# Patient Record
Sex: Female | Born: 1975 | Race: White | Hispanic: No | Marital: Married | Smoking: Former smoker
Health system: Southern US, Community
[De-identification: ages and names within clinical notes are randomized; demographics above are authoritative.]

## PROBLEM LIST (undated history)

## (undated) DIAGNOSIS — M1990 Unspecified osteoarthritis, unspecified site: Secondary | ICD-10-CM

## (undated) DIAGNOSIS — Z8669 Personal history of other diseases of the nervous system and sense organs: Secondary | ICD-10-CM

## (undated) DIAGNOSIS — Z87442 Personal history of urinary calculi: Secondary | ICD-10-CM

## (undated) DIAGNOSIS — M069 Rheumatoid arthritis, unspecified: Secondary | ICD-10-CM

## (undated) DIAGNOSIS — R002 Palpitations: Secondary | ICD-10-CM

## (undated) DIAGNOSIS — I499 Cardiac arrhythmia, unspecified: Secondary | ICD-10-CM

## (undated) DIAGNOSIS — R7303 Prediabetes: Secondary | ICD-10-CM

## (undated) DIAGNOSIS — T7840XA Allergy, unspecified, initial encounter: Secondary | ICD-10-CM

## (undated) DIAGNOSIS — N3281 Overactive bladder: Secondary | ICD-10-CM

## (undated) DIAGNOSIS — E039 Hypothyroidism, unspecified: Secondary | ICD-10-CM

## (undated) DIAGNOSIS — J45909 Unspecified asthma, uncomplicated: Secondary | ICD-10-CM

## (undated) DIAGNOSIS — K219 Gastro-esophageal reflux disease without esophagitis: Secondary | ICD-10-CM

## (undated) HISTORY — DX: Unspecified asthma, uncomplicated: J45.909

## (undated) HISTORY — PX: WISDOM TOOTH EXTRACTION: SHX21

## (undated) HISTORY — DX: Hypothyroidism, unspecified: E03.9

## (undated) HISTORY — PX: TUBAL LIGATION: SHX77

## (undated) HISTORY — PX: ABDOMINAL HYSTERECTOMY: SHX81

## (undated) HISTORY — DX: Overactive bladder: N32.81

## (undated) HISTORY — DX: Personal history of other diseases of the nervous system and sense organs: Z86.69

## (undated) HISTORY — DX: Rheumatoid arthritis, unspecified: M06.9

## (undated) HISTORY — DX: Allergy, unspecified, initial encounter: T78.40XA

---

## 2014-03-17 HISTORY — PX: CHOLECYSTECTOMY: SHX55

## 2015-12-31 ENCOUNTER — Other Ambulatory Visit (HOSPITAL_COMMUNITY): Payer: Self-pay | Admitting: Podiatry

## 2015-12-31 DIAGNOSIS — I739 Peripheral vascular disease, unspecified: Secondary | ICD-10-CM

## 2016-01-01 ENCOUNTER — Other Ambulatory Visit (HOSPITAL_COMMUNITY): Payer: Self-pay | Admitting: Podiatry

## 2016-01-01 DIAGNOSIS — M79605 Pain in left leg: Secondary | ICD-10-CM

## 2016-01-01 DIAGNOSIS — M79604 Pain in right leg: Secondary | ICD-10-CM

## 2016-01-01 DIAGNOSIS — I8393 Asymptomatic varicose veins of bilateral lower extremities: Secondary | ICD-10-CM

## 2016-01-01 DIAGNOSIS — I739 Peripheral vascular disease, unspecified: Secondary | ICD-10-CM

## 2016-01-02 ENCOUNTER — Other Ambulatory Visit (HOSPITAL_COMMUNITY): Payer: Self-pay | Admitting: Podiatry

## 2016-01-02 DIAGNOSIS — I809 Phlebitis and thrombophlebitis of unspecified site: Secondary | ICD-10-CM

## 2016-01-04 ENCOUNTER — Other Ambulatory Visit (HOSPITAL_COMMUNITY): Payer: Self-pay

## 2016-01-09 ENCOUNTER — Ambulatory Visit (HOSPITAL_COMMUNITY)
Admission: RE | Admit: 2016-01-09 | Discharge: 2016-01-09 | Disposition: A | Discharge status: Home / Self Care | Payer: BLUE CROSS/BLUE SHIELD | Source: Ambulatory Visit | Attending: Podiatry | Admitting: Podiatry

## 2016-01-09 DIAGNOSIS — I8393 Asymptomatic varicose veins of bilateral lower extremities: Secondary | ICD-10-CM

## 2016-01-09 DIAGNOSIS — M79604 Pain in right leg: Secondary | ICD-10-CM

## 2016-01-09 DIAGNOSIS — M79605 Pain in left leg: Secondary | ICD-10-CM | POA: Diagnosis not present

## 2016-01-09 DIAGNOSIS — I739 Peripheral vascular disease, unspecified: Secondary | ICD-10-CM

## 2016-06-30 ENCOUNTER — Encounter: Payer: Self-pay | Admitting: Obstetrics and Gynecology

## 2016-06-30 ENCOUNTER — Ambulatory Visit (INDEPENDENT_AMBULATORY_CARE_PROVIDER_SITE_OTHER): Payer: BLUE CROSS/BLUE SHIELD | Admitting: Obstetrics and Gynecology

## 2016-06-30 ENCOUNTER — Other Ambulatory Visit (HOSPITAL_COMMUNITY)
Admission: RE | Admit: 2016-06-30 | Discharge: 2016-06-30 | Disposition: A | Discharge status: Home / Self Care | Payer: BLUE CROSS/BLUE SHIELD | Source: Ambulatory Visit | Attending: Obstetrics and Gynecology | Admitting: Obstetrics and Gynecology

## 2016-06-30 ENCOUNTER — Telehealth: Payer: Self-pay | Admitting: *Deleted

## 2016-06-30 VITALS — BP 138/58 | HR 64 | Ht 66.0 in | Wt 186.0 lb

## 2016-06-30 DIAGNOSIS — Z01419 Encounter for gynecological examination (general) (routine) without abnormal findings: Secondary | ICD-10-CM | POA: Insufficient documentation

## 2016-06-30 DIAGNOSIS — I809 Phlebitis and thrombophlebitis of unspecified site: Secondary | ICD-10-CM

## 2016-06-30 DIAGNOSIS — N6311 Unspecified lump in the right breast, upper outer quadrant: Secondary | ICD-10-CM

## 2016-06-30 DIAGNOSIS — R300 Dysuria: Secondary | ICD-10-CM

## 2016-06-30 LAB — POCT URINALYSIS DIPSTICK
Blood, UA: NEGATIVE
GLUCOSE UA: NEGATIVE
Ketones, UA: NEGATIVE
LEUKOCYTES UA: NEGATIVE
NITRITE UA: NEGATIVE
Protein, UA: NEGATIVE

## 2016-06-30 NOTE — Telephone Encounter (Signed)
Pt informed of appointment information.

## 2016-06-30 NOTE — Progress Notes (Signed)
Patient ID: Kendra Soto, female   DOB: [birth year], 41 y.o.   MRN: [id number]  Assessment:  Annual Gyn Exam Right breast mass    Plan:  1. pap smear done, repeat in 1 year d/t h/o abnormal pap  2. return annually or prn 3    Annual mammogram and regular self exams advised 4. Diagnostic mammogram ordered  5. Pt to sign up via MyChart    Subjective:   Chief Complaint  Patient presents with  . Annual Exam    greenish mucus D/C, some lower abd pain with urination, vaginal itching, decreased sex drive, fatigue     Kendra Soto is a 41 y.o. female [id number] who presents for annual exam. Patient's last menstrual period was 06/20/2016. The patient has no complaints today. She reports h/o abnormal pap ~8 years ago. Last mammogram was 2 years ago.   The following portions of the patient's history were reviewed and updated as appropriate: allergies, current medications, past family history, past medical history, past social history, past surgical history and problem list. Past Medical History:  Diagnosis Date  . Asthma     Past Surgical History:  Procedure Laterality Date  . CHOLECYSTECTOMY    . TUBAL LIGATION    . WISDOM TOOTH EXTRACTION       Current Outpatient Prescriptions:  .  loratadine (CLARITIN) 10 MG tablet, Take 10 mg by mouth daily., Disp: , Rfl:  .  Multiple Vitamins-Minerals (HAIR SKIN NAILS PO), Take by mouth., Disp: , Rfl:  .  Multiple Vitamins-Minerals (WOMENS MULTI VITAMIN & MINERAL PO), Take by mouth., Disp: , Rfl:   Review of Systems Constitutional: negative Gastrointestinal: negative Genitourinary: negative   Objective:  BP (!) 138/58 (BP Location: Right Arm, Patient Position: Sitting, Cuff Size: Normal)   Pulse 64   Ht 5\' 6"  (1.676 m)   Wt 186 lb (84.4 kg)   LMP 06/20/2016   BMI 30.02 kg/m    BMI: Body mass index is 30.02 kg/m.  General Appearance: Alert, appropriate appearance for age. No acute distress HEENT: Grossly normal Neck / Thyroid:   Cardiovascular: RRR; normal S1, S2, no murmur Lungs: CTA bilaterally Back: No CVAT Breast Exam: No dimpling, nipple retraction or discharge. 1 cm firm, mobile nodule located 5 cm up from the right nipple that is mildly TTP.  Gastrointestinal: Soft, non-tender, no masses or organomegaly Pelvic Exam:  External genitalia: normal general appearance Vaginal: normal mucosa without prolapse or lesions Cervix: everted, standard PAP obtained Adnexa: normal bimanual exam Uterus: normal single, nontender, well supported, anteflexed  Rectovaginal: normal rectal, no masses and guaiac negative stool obtained; good support, no rectocele present  Lymphatic Exam: Non-palpable nodes in neck, clavicular, axillary, or inguinal regions  Skin: no rash or abnormalities Neurologic: Normal gait and speech, no tremor  Psychiatric: Alert and oriented, appropriate affect.  Urinalysis:Not done  Guaiac negative stool  Mallory Shirk. MD Pgr [phone] 9:16 AM   By signing my name below, I, Hansel Feinstein, attest that this documentation has been prepared under the direction and in the presence of Jonnie Kind, MD. Electronically Signed: Hansel Feinstein, ED Scribe. 06/30/16. 9:08 AM.  I personally performed the services described in this documentation, which was SCRIBED in my presence. The recorded information has been reviewed and considered accurate. It has been edited as necessary during review. Jonnie Kind, MD

## 2016-06-30 NOTE — Telephone Encounter (Signed)
LMOM to call me back for appointment details for diagnostic mammogram.  I have it scheduled for Tuesday May 8th at 2:20, pt needs to register at 2:10 @ [facility].

## 2016-07-04 LAB — CYTOLOGY - PAP
Chlamydia: NEGATIVE
DIAGNOSIS: NEGATIVE
HPV (WINDOPATH): NOT DETECTED
NEISSERIA GONORRHEA: NEGATIVE

## 2016-07-10 ENCOUNTER — Other Ambulatory Visit: Payer: Self-pay | Admitting: Obstetrics and Gynecology

## 2016-07-10 DIAGNOSIS — IMO0002 Reserved for concepts with insufficient information to code with codable children: Secondary | ICD-10-CM

## 2016-07-10 DIAGNOSIS — R229 Localized swelling, mass and lump, unspecified: Principal | ICD-10-CM

## 2016-07-16 ENCOUNTER — Ambulatory Visit (HOSPITAL_COMMUNITY)
Admission: RE | Admit: 2016-07-16 | Discharge: 2016-07-16 | Disposition: A | Discharge status: Home / Self Care | Payer: BLUE CROSS/BLUE SHIELD | Source: Ambulatory Visit | Attending: Obstetrics and Gynecology | Admitting: Obstetrics and Gynecology

## 2016-07-16 DIAGNOSIS — N6311 Unspecified lump in the right breast, upper outer quadrant: Secondary | ICD-10-CM | POA: Diagnosis present

## 2016-07-16 DIAGNOSIS — R229 Localized swelling, mass and lump, unspecified: Principal | ICD-10-CM

## 2016-07-16 DIAGNOSIS — IMO0002 Reserved for concepts with insufficient information to code with codable children: Secondary | ICD-10-CM

## 2016-07-22 ENCOUNTER — Encounter (HOSPITAL_COMMUNITY): Payer: BLUE CROSS/BLUE SHIELD

## 2018-01-18 ENCOUNTER — Encounter: Payer: Self-pay | Admitting: Internal Medicine

## 2018-02-07 IMAGING — US ULTRASOUND RIGHT BREAST LIMITED
1 series · 13 of 18 positions shown · non-contrast
Comparison: None available

ADDENDUM:
Outside films are now available. No interval changes or suspicious
findings.
CLINICAL DATA: Palpable lump in the right breast

EXAM:
2D DIGITAL DIAGNOSTIC BILATERAL MAMMOGRAM WITH CAD AND ADJUNCT TOMO
ULTRASOUND RIGHT BREAST

[Series 1: ultrasound right breast limited · 0.07mm/px · 13 of 18 slices shown]
[im 1/18]
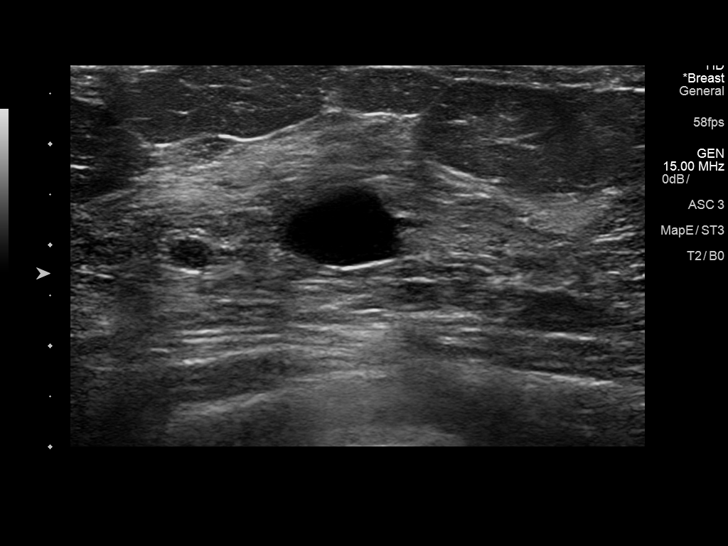
[im 3/18]
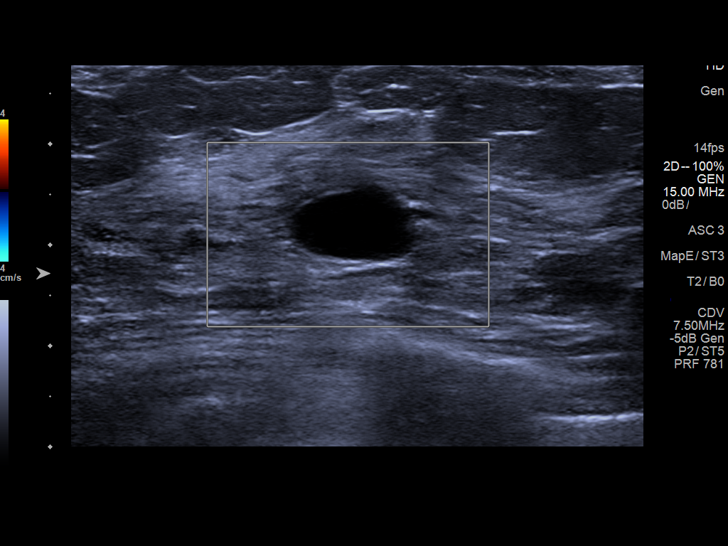
[im 4/18]
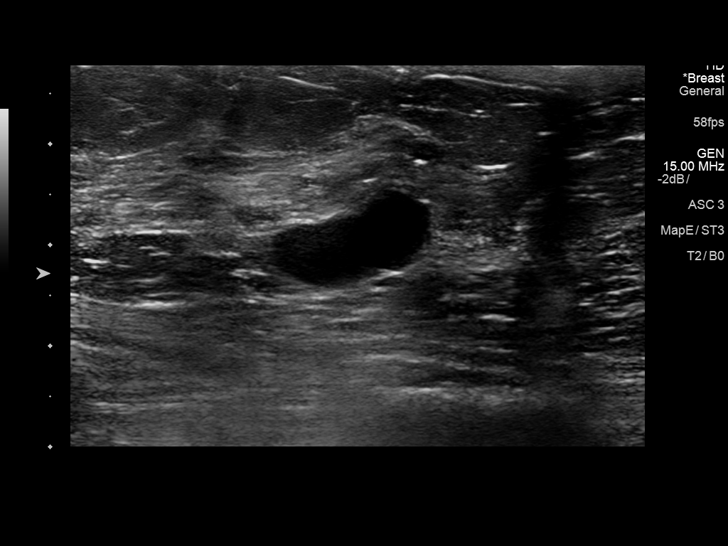
[im 5/18]
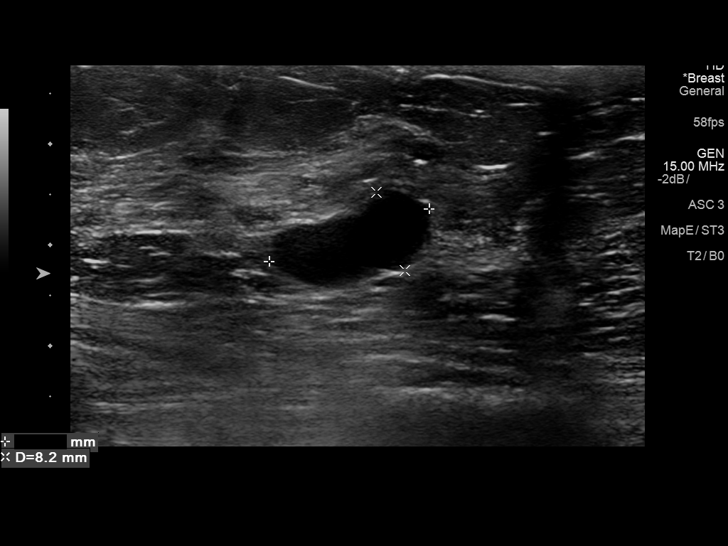
[im 7/18]
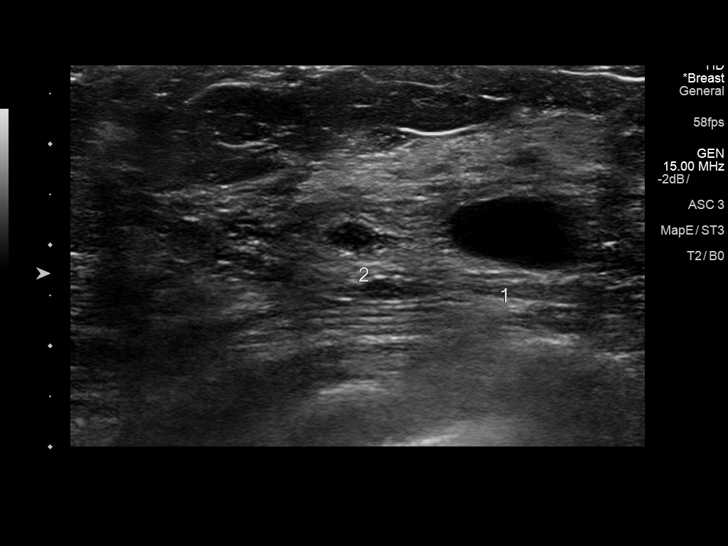
[im 8/18]
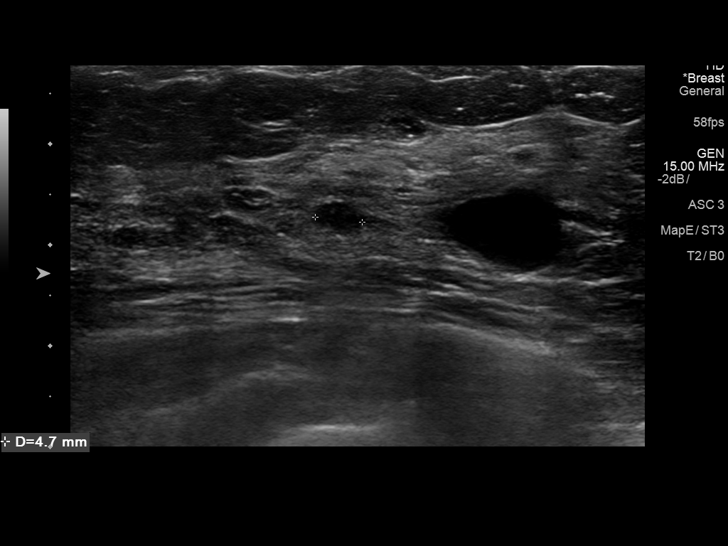
[im 10/18]
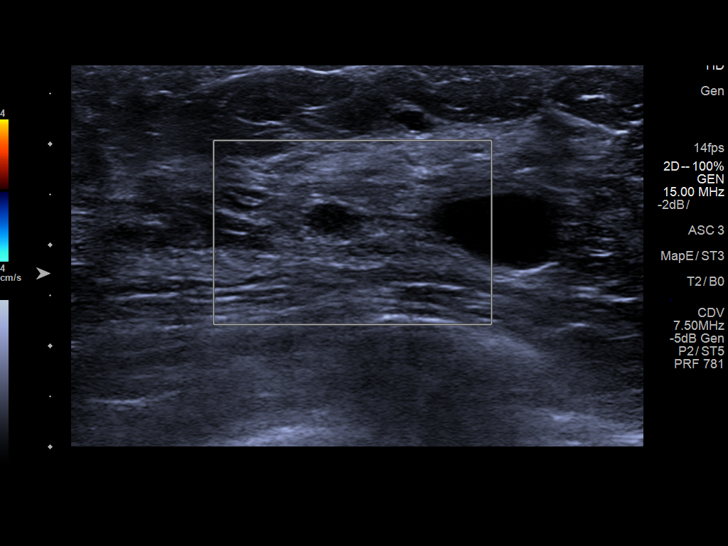
[im 11/18]
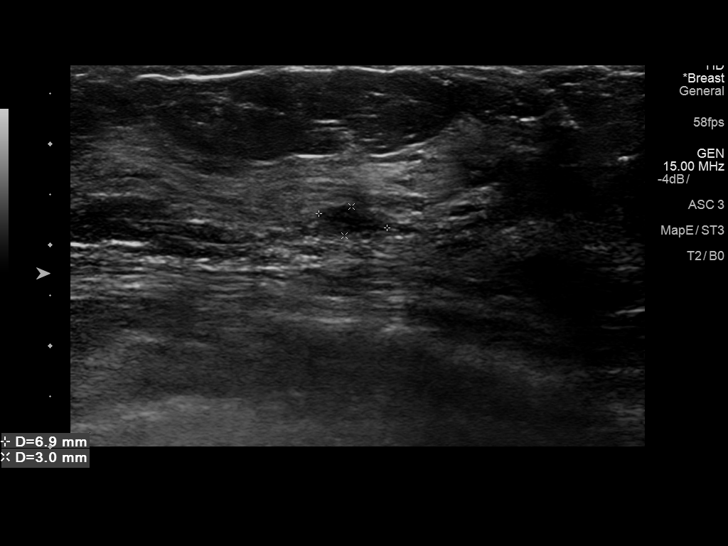
[im 12/18]
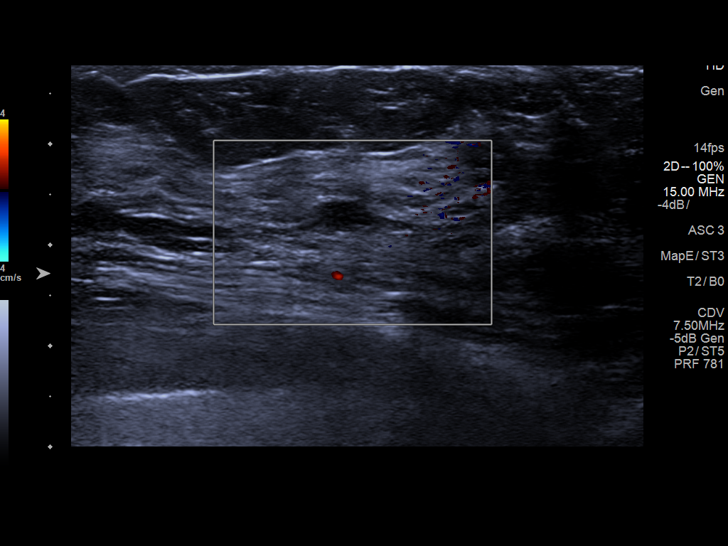
[im 14/18]
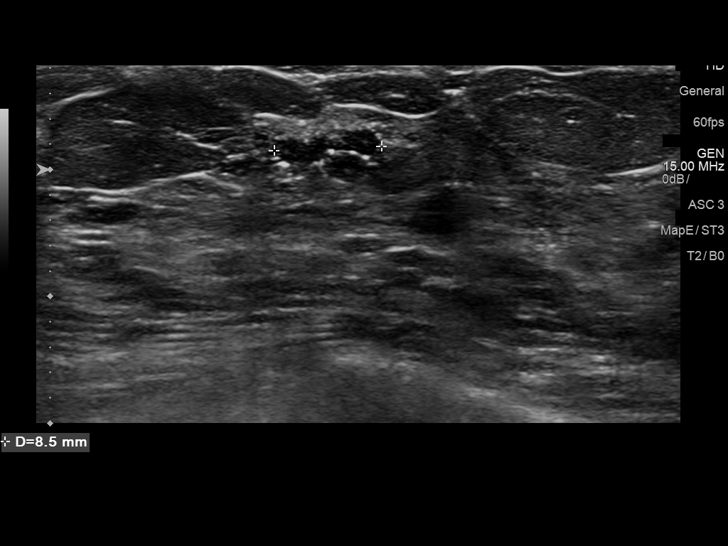
[im 15/18]
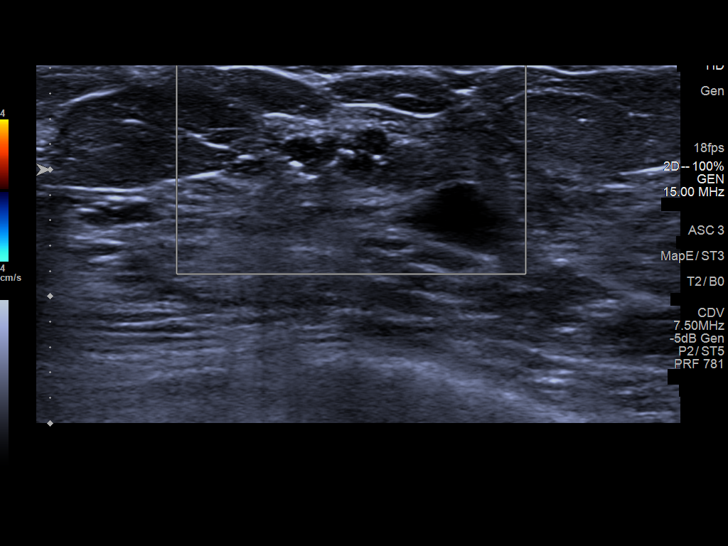
[im 16/18]
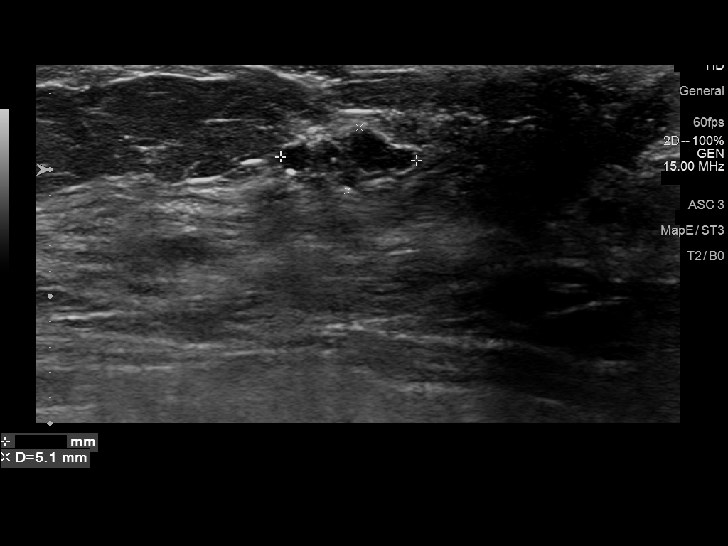
[im 18/18]
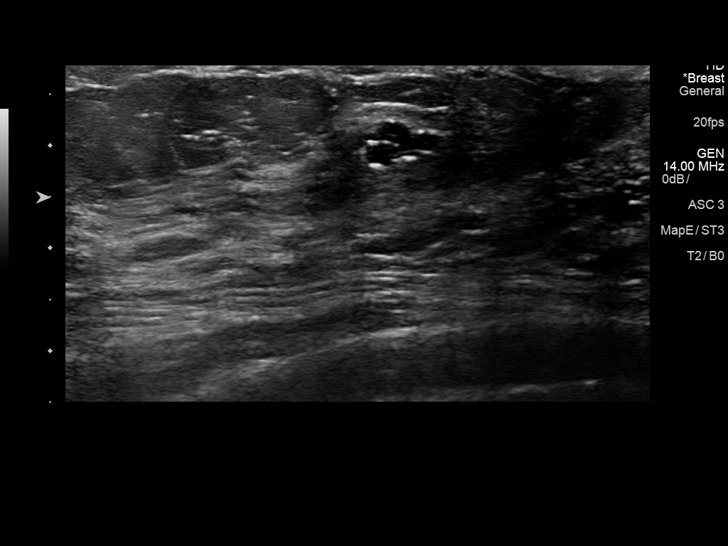

[13 of 18 positions shown; findings below may reference images not displayed]

IMPRESSION: Fibrocystic changes. No mammographic or sonographic evidence of
malignancy.

RECOMMENDATION: Treatment of the patient's symptoms should be based
on clinical and physical exam given the lack of imaging findings.
Recommend annual screening mammography.

BI-RADS category 2-benign
FINDINGS: Benign calcifications are seen bilaterally. No suspicious masses
seen in either breast.

Mammographic images were processed with CAD.

On physical exam,  no suspicious lumps.

Targeted ultrasound is performed, showing an island of tissue in the
region of the patient's symptoms. Adjacent to this island of tissue
are multiple simple cysts and clusters of cysts.
IMPRESSION: Fibrocystic changes. The patient appears to be feeling an island of
tissue. I final impression will be made once outside films are
available for comparison.

RECOMMENDATION:
Obtain outside films for comparison.

I have discussed the findings and recommendations with the patient.
Results were also provided in writing at the conclusion of the
visit. If applicable, a reminder letter will be sent to the patient
regarding the next appointment.

BI-RADS CATEGORY  0: Incomplete. Need additional imaging evaluation
and/or prior mammograms for comparison.

## 2018-04-16 ENCOUNTER — Encounter: Payer: Self-pay | Admitting: Gastroenterology

## 2018-04-16 ENCOUNTER — Encounter: Payer: Self-pay | Admitting: *Deleted

## 2018-04-16 ENCOUNTER — Other Ambulatory Visit: Payer: Self-pay | Admitting: *Deleted

## 2018-04-16 ENCOUNTER — Ambulatory Visit: Payer: BLUE CROSS/BLUE SHIELD | Admitting: Gastroenterology

## 2018-04-16 DIAGNOSIS — G8929 Other chronic pain: Secondary | ICD-10-CM

## 2018-04-16 DIAGNOSIS — R1310 Dysphagia, unspecified: Secondary | ICD-10-CM | POA: Diagnosis not present

## 2018-04-16 DIAGNOSIS — R1013 Epigastric pain: Secondary | ICD-10-CM | POA: Diagnosis not present

## 2018-04-16 DIAGNOSIS — R1011 Right upper quadrant pain: Principal | ICD-10-CM

## 2018-04-16 DIAGNOSIS — R1319 Other dysphagia: Secondary | ICD-10-CM

## 2018-04-16 DIAGNOSIS — K219 Gastro-esophageal reflux disease without esophagitis: Secondary | ICD-10-CM

## 2018-04-16 NOTE — H&P (View-Only) (Signed)
Primary Care Physician:  Sandi Mealy, MD  Primary Gastroenterologist:  Garfield Cornea, MD   Chief Complaint  Patient presents with  . Abdominal Pain    ruq  . Gastroesophageal Reflux    Omeprazole has helped some  . Dysphagia    HPI:  Kendra Soto is a 43 y.o. female here at the request of Dr. Lorra Hals for further evaluation of RUQ pain, GERD. She also complains of dysphagia.   Patient has had RUQ pain for years. She describes work up including several imaging tests followed by eventual cholecystectomy in 2016. She had a large gallstone. Having her gallbladder out did not improve her ruq pain.   Symptoms are sometimes worse with food. Often related to certain positions. Lying on either side in bed aggravates the pain. Prolonged sitting aggravates as well. Sometimes exercising makes it worse, especially if involves leaning forward or sideways.  Not affected by bowel habits.  She has heartburn often with or without meals.  Can happen with water. After eating, about 1-2 hours later she feels like food is sitting in the chest. Hard to swallow solid foods. Nausea but no vomiting.   BMs in the past may go 1-2 weeks without a stool. She has made dietary changes, with increased fruits and vegetables. Less red meat and junk food, less starch. BMs from [facility] 3-7. Up to 3-4 per day. No weight loss. Gained 20 pounds in the last one year with change in jobs. Now sitting more.    Labs done 01/2018 by PCP. Labs done by Dr. Cristal Deer in [place], rheumatologist. Normal LFTs, H/H, renal function.   Current Outpatient Medications  Medication Sig Dispense Refill  . Albuterol Sulfate (PROAIR HFA IN) Inhale into the lungs every 6 (six) hours. 2 puffs every 6 hours    . Diclofenac Sodium (PENNSAID) 2 % SOLN Place onto the skin 2 (two) times daily. Apply 2 pumps (40mg ) to affected knee twice daily    . folic acid (FOLVITE) 1 MG tablet Take 1 mg by mouth daily.    Marland Kitchen ipratropium (ATROVENT) 0.03 % nasal  spray Place 2 sprays into both nostrils 2 (two) times daily.    Marland Kitchen levothyroxine (SYNTHROID, LEVOTHROID) 50 MCG tablet Take 1 tablet by mouth daily.    . methotrexate (RHEUMATREX) 2.5 MG tablet Take 17.5 mg by mouth once a week.    . montelukast (SINGULAIR) 10 MG tablet Take 10 mg by mouth at bedtime.    . Multiple Vitamins-Minerals (HAIR SKIN NAILS PO) Take by mouth.    . Multiple Vitamins-Minerals (WOMENS MULTI VITAMIN & MINERAL PO) Take by mouth.    Marland Kitchen omeprazole (PRILOSEC) 40 MG capsule Take 1 capsule by mouth daily.    . predniSONE (RAYOS) 5 MG TBEC Take by mouth at bedtime.    Marland Kitchen tiZANidine (ZANAFLEX) 4 MG tablet Take 4 mg by mouth as needed.     . tolterodine (DETROL) 2 MG tablet Take 1 mg by mouth daily.     No current facility-administered medications for this visit.     Allergies as of 04/16/2018  . (No Known Allergies)    Past Medical History:  Diagnosis Date  . Allergies   . Asthma   . Hypothyroid   . Overactive bladder   . RA (rheumatoid arthritis) ([place])     Past Surgical History:  Procedure Laterality Date  . CHOLECYSTECTOMY  2016  . TUBAL LIGATION    . WISDOM TOOTH EXTRACTION      Family History  Problem  Relation Age of Onset  . Cancer Father        prostate  . Hypertension Father   . Hypertension Mother   . Colon cancer Neg Hx   . Celiac disease Neg Hx   . Crohn's disease Neg Hx     Social History   Socioeconomic History  . Marital status: Married    Spouse name: Not on file  . Number of children: Not on file  . Years of education: Not on file  . Highest education level: Not on file  Occupational History  . Not on file  Social Needs  . Financial resource strain: Not on file  . Food insecurity:    Worry: Not on file    Inability: Not on file  . Transportation needs:    Medical: Not on file    Non-medical: Not on file  Tobacco Use  . Smoking status: Former Research scientist (life sciences)  . Smokeless tobacco: Never Used  Substance and Sexual Activity  . Alcohol  use: No  . Drug use: No  . Sexual activity: Yes    Birth control/protection: Surgical  Lifestyle  . Physical activity:    Days per week: Not on file    Minutes per session: Not on file  . Stress: Not on file  Relationships  . Social connections:    Talks on phone: Not on file    Gets together: Not on file    Attends religious service: Not on file    Active member of club or organization: Not on file    Attends meetings of clubs or organizations: Not on file    Relationship status: Not on file  . Intimate partner violence:    Fear of current or ex partner: Not on file    Emotionally abused: Not on file    Physically abused: Not on file    Forced sexual activity: Not on file  Other Topics Concern  . Not on file  Social History Narrative  . Not on file      ROS:  General: Negative for anorexia, weight loss, fever, chills, fatigue, weakness. Eyes: Negative for vision changes.  ENT: Negative for hoarseness, difficulty swallowing , nasal congestion. CV: Negative for chest pain, angina, palpitations, dyspnea on exertion, peripheral edema.  Respiratory: Negative for dyspnea at rest, dyspnea on exertion, cough, sputum, wheezing.  GI: See history of present illness. GU:  Negative for dysuria, hematuria, urinary incontinence, urinary frequency, nocturnal urination.  MS: + joint pain, low back pain.  Derm: Negative for rash or itching.  Neuro: Negative for weakness, abnormal sensation, seizure, frequent headaches, memory loss, confusion.  Psych: Negative for anxiety, depression, suicidal ideation, hallucinations.  Endo: Negative for unusual weight change.  Heme: Negative for bruising or bleeding. Allergy: Negative for rash or hives.    Physical Examination:  BP 115/74   Pulse 88   Temp 98.1 F (36.7 C) (Oral)   Ht 5\' 5"  (1.651 m)   Wt 202 lb (91.6 kg)   LMP 03/23/2018 (Exact Date)   BMI 33.61 kg/m    General: Well-nourished, well-developed in no acute distress.  Head:  Normocephalic, atraumatic.   Eyes: Conjunctiva pink, no icterus. Mouth: Oropharyngeal mucosa moist and pink , no lesions erythema or exudate. Neck: Supple without thyromegaly, masses, or lymphadenopathy.  Lungs: Clear to auscultation bilaterally.  Heart: Regular rate and rhythm, no murmurs rubs or gallops.  Abdomen: Bowel sounds are normal,   nondistended, no hepatosplenomegaly or masses, no abdominal bruits or  hernia , no rebound or guarding.  Mild epigastric tenderness Rectal: not performed Extremities: No lower extremity edema. No clubbing or deformities.  Neuro: Alert and oriented x 4 , grossly normal neurologically.  Skin: Warm and dry, no rash or jaundice.   Psych: Alert and cooperative, normal mood and affect.  Labs: Requested. See hpi.  Imaging Studies: No results found.

## 2018-04-16 NOTE — Progress Notes (Signed)
Primary Care Physician:  Sandi Mealy, MD  Primary Gastroenterologist:  Garfield Cornea, MD   Chief Complaint  Patient presents with  . Abdominal Pain    ruq  . Gastroesophageal Reflux    Omeprazole has helped some  . Dysphagia    HPI:  Kendra Soto is a 43 y.o. female here at the request of Dr. Lorra Hals for further evaluation of RUQ pain, GERD. She also complains of dysphagia.   Patient has had RUQ pain for years. She describes work up including several imaging tests followed by eventual cholecystectomy in 2016. She had a large gallstone. Having her gallbladder out did not improve her ruq pain.   Symptoms are sometimes worse with food. Often related to certain positions. Lying on either side in bed aggravates the pain. Prolonged sitting aggravates as well. Sometimes exercising makes it worse, especially if involves leaning forward or sideways.  Not affected by bowel habits.  She has heartburn often with or without meals.  Can happen with water. After eating, about 1-2 hours later she feels like food is sitting in the chest. Hard to swallow solid foods. Nausea but no vomiting.   BMs in the past may go 1-2 weeks without a stool. She has made dietary changes, with increased fruits and vegetables. Less red meat and junk food, less starch. BMs from [facility] 3-7. Up to 3-4 per day. No weight loss. Gained 20 pounds in the last one year with change in jobs. Now sitting more.    Labs done 01/2018 by PCP. Labs done by Dr. Cristal Deer in [place], rheumatologist. Normal LFTs, H/H, renal function.   Current Outpatient Medications  Medication Sig Dispense Refill  . Albuterol Sulfate (PROAIR HFA IN) Inhale into the lungs every 6 (six) hours. 2 puffs every 6 hours    . Diclofenac Sodium (PENNSAID) 2 % SOLN Place onto the skin 2 (two) times daily. Apply 2 pumps (40mg ) to affected knee twice daily    . folic acid (FOLVITE) 1 MG tablet Take 1 mg by mouth daily.    Marland Kitchen ipratropium (ATROVENT) 0.03 % nasal  spray Place 2 sprays into both nostrils 2 (two) times daily.    Marland Kitchen levothyroxine (SYNTHROID, LEVOTHROID) 50 MCG tablet Take 1 tablet by mouth daily.    . methotrexate (RHEUMATREX) 2.5 MG tablet Take 17.5 mg by mouth once a week.    . montelukast (SINGULAIR) 10 MG tablet Take 10 mg by mouth at bedtime.    . Multiple Vitamins-Minerals (HAIR SKIN NAILS PO) Take by mouth.    . Multiple Vitamins-Minerals (WOMENS MULTI VITAMIN & MINERAL PO) Take by mouth.    Marland Kitchen omeprazole (PRILOSEC) 40 MG capsule Take 1 capsule by mouth daily.    . predniSONE (RAYOS) 5 MG TBEC Take by mouth at bedtime.    Marland Kitchen tiZANidine (ZANAFLEX) 4 MG tablet Take 4 mg by mouth as needed.     . tolterodine (DETROL) 2 MG tablet Take 1 mg by mouth daily.     No current facility-administered medications for this visit.     Allergies as of 04/16/2018  . (No Known Allergies)    Past Medical History:  Diagnosis Date  . Allergies   . Asthma   . Hypothyroid   . Overactive bladder   . RA (rheumatoid arthritis) ([place])     Past Surgical History:  Procedure Laterality Date  . CHOLECYSTECTOMY  2016  . TUBAL LIGATION    . WISDOM TOOTH EXTRACTION      Family History  Problem  Relation Age of Onset  . Cancer Father        prostate  . Hypertension Father   . Hypertension Mother   . Colon cancer Neg Hx   . Celiac disease Neg Hx   . Crohn's disease Neg Hx     Social History   Socioeconomic History  . Marital status: Married    Spouse name: Not on file  . Number of children: Not on file  . Years of education: Not on file  . Highest education level: Not on file  Occupational History  . Not on file  Social Needs  . Financial resource strain: Not on file  . Food insecurity:    Worry: Not on file    Inability: Not on file  . Transportation needs:    Medical: Not on file    Non-medical: Not on file  Tobacco Use  . Smoking status: Former Research scientist (life sciences)  . Smokeless tobacco: Never Used  Substance and Sexual Activity  . Alcohol  use: No  . Drug use: No  . Sexual activity: Yes    Birth control/protection: Surgical  Lifestyle  . Physical activity:    Days per week: Not on file    Minutes per session: Not on file  . Stress: Not on file  Relationships  . Social connections:    Talks on phone: Not on file    Gets together: Not on file    Attends religious service: Not on file    Active member of club or organization: Not on file    Attends meetings of clubs or organizations: Not on file    Relationship status: Not on file  . Intimate partner violence:    Fear of current or ex partner: Not on file    Emotionally abused: Not on file    Physically abused: Not on file    Forced sexual activity: Not on file  Other Topics Concern  . Not on file  Social History Narrative  . Not on file      ROS:  General: Negative for anorexia, weight loss, fever, chills, fatigue, weakness. Eyes: Negative for vision changes.  ENT: Negative for hoarseness, difficulty swallowing , nasal congestion. CV: Negative for chest pain, angina, palpitations, dyspnea on exertion, peripheral edema.  Respiratory: Negative for dyspnea at rest, dyspnea on exertion, cough, sputum, wheezing.  GI: See history of present illness. GU:  Negative for dysuria, hematuria, urinary incontinence, urinary frequency, nocturnal urination.  MS: + joint pain, low back pain.  Derm: Negative for rash or itching.  Neuro: Negative for weakness, abnormal sensation, seizure, frequent headaches, memory loss, confusion.  Psych: Negative for anxiety, depression, suicidal ideation, hallucinations.  Endo: Negative for unusual weight change.  Heme: Negative for bruising or bleeding. Allergy: Negative for rash or hives.    Physical Examination:  BP 115/74   Pulse 88   Temp 98.1 F (36.7 C) (Oral)   Ht 5\' 5"  (1.651 m)   Wt 202 lb (91.6 kg)   LMP 03/23/2018 (Exact Date)   BMI 33.61 kg/m    General: Well-nourished, well-developed in no acute distress.  Head:  Normocephalic, atraumatic.   Eyes: Conjunctiva pink, no icterus. Mouth: Oropharyngeal mucosa moist and pink , no lesions erythema or exudate. Neck: Supple without thyromegaly, masses, or lymphadenopathy.  Lungs: Clear to auscultation bilaterally.  Heart: Regular rate and rhythm, no murmurs rubs or gallops.  Abdomen: Bowel sounds are normal,   nondistended, no hepatosplenomegaly or masses, no abdominal bruits or  hernia , no rebound or guarding.  Mild epigastric tenderness Rectal: not performed Extremities: No lower extremity edema. No clubbing or deformities.  Neuro: Alert and oriented x 4 , grossly normal neurologically.  Skin: Warm and dry, no rash or jaundice.   Psych: Alert and cooperative, normal mood and affect.  Labs: Requested. See hpi.  Imaging Studies: No results found.

## 2018-04-16 NOTE — Assessment & Plan Note (Signed)
43 y/o female with chronic RUQ/epigastric pain dating back at least four to five years. She had her gallbladder removed in 2016, reported having at least one large stone, but no relief in her pain. Pain is sometimes related to meals but also positional. Worse with prolonged sitting, bending over and lying on either side.   She has frequent heartburn, solid food dysphagia. She is on omeprazole daily. She is also on prednisone daily.  Her ruq/epigastric pain may be multifactorial. Need to exclude gastritis, pud, complicated GERD. We will review prior imaging and obtain copy of recent labs. Plan for EGD/ED with Dr. Gala Romney in near future.  I have discussed the risks, alternatives, benefits with regards to but not limited to the risk of reaction to medication, bleeding, infection, perforation and the patient is agreeable to proceed. Written consent to be obtained.

## 2018-04-16 NOTE — Patient Instructions (Signed)
We will obtain copy of records for review and will be in touch if additional information needed.  Plan for upper endoscopy in near future. See separate instructions.

## 2018-04-19 NOTE — Progress Notes (Signed)
CC'D TO PCP °

## 2018-04-30 ENCOUNTER — Other Ambulatory Visit: Payer: Self-pay

## 2018-04-30 ENCOUNTER — Ambulatory Visit (HOSPITAL_COMMUNITY)
Admission: RE | Admit: 2018-04-30 | Discharge: 2018-04-30 | Disposition: A | Discharge status: Home / Self Care | Payer: BLUE CROSS/BLUE SHIELD | Attending: Internal Medicine | Admitting: Internal Medicine

## 2018-04-30 ENCOUNTER — Encounter (HOSPITAL_COMMUNITY)
Admission: RE | Disposition: A | Discharge status: Home / Self Care | Payer: Self-pay | Source: Home / Self Care | Attending: Internal Medicine

## 2018-04-30 ENCOUNTER — Encounter (HOSPITAL_COMMUNITY): Payer: Self-pay | Admitting: *Deleted

## 2018-04-30 DIAGNOSIS — J45909 Unspecified asthma, uncomplicated: Secondary | ICD-10-CM | POA: Insufficient documentation

## 2018-04-30 DIAGNOSIS — R1310 Dysphagia, unspecified: Secondary | ICD-10-CM | POA: Insufficient documentation

## 2018-04-30 DIAGNOSIS — M069 Rheumatoid arthritis, unspecified: Secondary | ICD-10-CM | POA: Diagnosis not present

## 2018-04-30 DIAGNOSIS — Z79899 Other long term (current) drug therapy: Secondary | ICD-10-CM | POA: Diagnosis not present

## 2018-04-30 DIAGNOSIS — E039 Hypothyroidism, unspecified: Secondary | ICD-10-CM | POA: Insufficient documentation

## 2018-04-30 DIAGNOSIS — R1319 Other dysphagia: Secondary | ICD-10-CM

## 2018-04-30 DIAGNOSIS — Z79890 Hormone replacement therapy (postmenopausal): Secondary | ICD-10-CM | POA: Insufficient documentation

## 2018-04-30 DIAGNOSIS — G8929 Other chronic pain: Secondary | ICD-10-CM

## 2018-04-30 DIAGNOSIS — K219 Gastro-esophageal reflux disease without esophagitis: Secondary | ICD-10-CM

## 2018-04-30 DIAGNOSIS — Z7952 Long term (current) use of systemic steroids: Secondary | ICD-10-CM | POA: Diagnosis not present

## 2018-04-30 DIAGNOSIS — Z9049 Acquired absence of other specified parts of digestive tract: Secondary | ICD-10-CM | POA: Diagnosis not present

## 2018-04-30 DIAGNOSIS — Z87891 Personal history of nicotine dependence: Secondary | ICD-10-CM | POA: Insufficient documentation

## 2018-04-30 DIAGNOSIS — R1011 Right upper quadrant pain: Secondary | ICD-10-CM | POA: Diagnosis present

## 2018-04-30 DIAGNOSIS — N3281 Overactive bladder: Secondary | ICD-10-CM | POA: Insufficient documentation

## 2018-04-30 DIAGNOSIS — R1013 Epigastric pain: Secondary | ICD-10-CM

## 2018-04-30 HISTORY — PX: MALONEY DILATION: SHX5535

## 2018-04-30 HISTORY — PX: ESOPHAGOGASTRODUODENOSCOPY: SHX5428

## 2018-04-30 HISTORY — DX: Personal history of urinary calculi: Z87.442

## 2018-04-30 SURGERY — EGD (ESOPHAGOGASTRODUODENOSCOPY)
Anesthesia: Moderate Sedation

## 2018-04-30 MED ORDER — MEPERIDINE HCL 100 MG/ML IJ SOLN
INTRAMUSCULAR | Status: DC | PRN
  Administered 2018-04-30: 25 mg
  Administered 2018-04-30: 15 mg via INTRAVENOUS

## 2018-04-30 MED ORDER — ONDANSETRON HCL 4 MG/2ML IJ SOLN
INTRAMUSCULAR | Status: DC | PRN
  Administered 2018-04-30: 4 mg via INTRAVENOUS

## 2018-04-30 MED ORDER — ONDANSETRON HCL 4 MG/2ML IJ SOLN
INTRAMUSCULAR | Status: AC
  Filled 2018-04-30: qty 2

## 2018-04-30 MED ORDER — LIDOCAINE VISCOUS HCL 2 % MT SOLN
OROMUCOSAL | Status: DC | PRN
  Administered 2018-04-30: 1 via OROMUCOSAL

## 2018-04-30 MED ORDER — LIDOCAINE VISCOUS HCL 2 % MT SOLN
OROMUCOSAL | Status: AC
  Filled 2018-04-30: qty 15

## 2018-04-30 MED ORDER — MIDAZOLAM HCL 5 MG/5ML IJ SOLN
INTRAMUSCULAR | Status: AC
  Filled 2018-04-30: qty 10

## 2018-04-30 MED ORDER — MIDAZOLAM HCL 5 MG/5ML IJ SOLN
INTRAMUSCULAR | Status: DC | PRN
  Administered 2018-04-30: 2 mg via INTRAVENOUS
  Administered 2018-04-30: 1 mg via INTRAVENOUS
  Administered 2018-04-30: 2 mg via INTRAVENOUS
  Administered 2018-04-30: 1 mg via INTRAVENOUS
  Administered 2018-04-30: 2 mg via INTRAVENOUS
  Administered 2018-04-30: 1 mg via INTRAVENOUS

## 2018-04-30 MED ORDER — STERILE WATER FOR IRRIGATION IR SOLN
Status: DC | PRN
  Administered 2018-04-30: 11:00:00

## 2018-04-30 MED ORDER — SODIUM CHLORIDE 0.9 % IV SOLN
INTRAVENOUS | Status: DC
  Administered 2018-04-30: 1000 mL via INTRAVENOUS

## 2018-04-30 MED ORDER — MEPERIDINE HCL 50 MG/ML IJ SOLN
INTRAMUSCULAR | Status: AC
  Filled 2018-04-30: qty 1

## 2018-04-30 NOTE — Op Note (Signed)
[facility] Patient Name: Kendra Soto Procedure Date: 04/30/2018 11:08 AM MRN: [id number] Date of Birth: 01-03-1976 Attending MD: Norvel Richards , MD CSN: [id number] Age: 43 Admit Type: Inpatient Procedure:                Upper GI endoscopy Indications:              Dysphagia Providers:                Norvel Richards, MD, Lurline Del, RN, Gerome Sam, RN Referring MD:              Medicines:                Midazolam 9 mg IV, Meperidine 40 mg IV, Ondansetron                            4 mg IV Complications:            No immediate complications. Estimated Blood Loss:     Estimated blood loss was minimal. Procedure:                Pre-Anesthesia Assessment:                           - Prior to the procedure, a History and Physical                            was performed, and patient medications and                            allergies were reviewed. The patient's tolerance of                            previous anesthesia was also reviewed. The risks                            and benefits of the procedure and the sedation                            options and risks were discussed with the patient.                            All questions were answered, and informed consent                            was obtained. Prior Anticoagulants: The patient has                            taken no previous anticoagulant or antiplatelet                            agents. ASA Grade Assessment: II - A patient with  mild systemic disease. After reviewing the risks                            and benefits, the patient was deemed in                            satisfactory condition to undergo the procedure.                           After obtaining informed consent, the endoscope was                            passed under direct vision. Throughout the                            procedure, the patient's blood pressure, pulse, and                             oxygen saturations were monitored continuously. The                            GIF-H190 ([id number]) was introduced through the                            mouth, and advanced to the second part of duodenum.                            The upper GI endoscopy was accomplished without                            difficulty. The patient tolerated the procedure                            well. Scope In: 11:25:27 AM Scope Out: 11:33:35 AM Total Procedure Duration: 0 hours 8 minutes 8 seconds  Findings:      The examined esophagus was normal.      The entire examined stomach was normal.      The duodenal bulb and second portion of the duodenum were normal. The       scope was withdrawn. Dilation was performed with a Maloney dilator with       mild resistance at 41 Fr. The dilation site was examined and showed no       change. Estimated blood loss was minimal. Impression:               - Normal esophagus. Dilated.                           - Normal stomach.                           - Normal duodenal bulb and second portion of the                            duodenum.                           -  No specimens collected. Moderate Sedation:      Moderate (conscious) sedation was administered by the endoscopy nurse       and supervised by the endoscopist. The following parameters were       monitored: oxygen saturation, heart rate, blood pressure, respiratory       rate, EKG, adequacy of pulmonary ventilation, and response to care.       Total physician intraservice time was 21 minutes. Recommendation:           - Patient has a contact number available for                            emergencies. The signs and symptoms of potential                            delayed complications were discussed with the                            patient. Return to normal activities tomorrow.                            Written discharge instructions were provided to the                             patient.                           - Resume previous diet.                           - Continue present medications. Stop omeprazole for                            now; trial of Dexilant 60 mg daily.                           - No repeat upper endoscopy.                           - Return to GI office in 3 months. Procedure Code(s):        --- Professional ---                           [phone], Esophagogastroduodenoscopy, flexible,                            transoral; diagnostic, including collection of                            specimen(s) by brushing or washing, when performed                            (separate procedure)                           82993, Dilation of esophagus, by unguided sound or  bougie, single or multiple passes                           G0500, Moderate sedation services provided by the                            same physician or other qualified health care                            professional performing a gastrointestinal                            endoscopic service that sedation supports,                            requiring the presence of an independent trained                            observer to assist in the monitoring of the                            patient's level of consciousness and physiological                            status; initial 15 minutes of intra-service time;                            patient age 24 years or older (additional time may                            be reported with [phone], as appropriate) Diagnosis Code(s):        --- Professional ---                           R13.10, Dysphagia, unspecified CPT copyright 2018 American Medical Association. All rights reserved. The codes documented in this report are preliminary and upon coder review may  be revised to meet current compliance requirements. [name]. [name], MD Norvel Richards, MD 04/30/2018 11:40:36 AM This report has been signed  electronically. Number of Addenda: 0

## 2018-04-30 NOTE — Interval H&P Note (Signed)
History and Physical Interval Note:  04/30/2018 11:03 AM  Hillis Range  has presented today for surgery, with the diagnosis of dysphagia, RUQ pain, epigastic pain, gerd  The various methods of treatment have been discussed with the patient and family. After consideration of risks, benefits and other options for treatment, the patient has consented to  Procedure(s) with comments: ESOPHAGOGASTRODUODENOSCOPY (EGD) (N/A) - 12:00pm MALONEY DILATION (N/A) as a surgical intervention .  The patient's history has been reviewed, patient examined, no change in status, stable for surgery.  I have reviewed the patient's chart and labs.  Questions were answered to the patient's satisfaction.     Ogle Hoeffner   No change.  Symptoms refractory to omeprazole 40 mg daily.  EGD with ED as feasible/appropriate per plan.  The risks, benefits, limitations, alternatives and imponderables have been reviewed with the patient. Potential for esophageal dilation, biopsy, etc. have also been reviewed.  Questions have been answered. All parties agreeable.

## 2018-04-30 NOTE — Discharge Instructions (Signed)
EGD Discharge instructions Please read the instructions outlined below and refer to this sheet in the next few weeks. These discharge instructions provide you with general information on caring for yourself after you leave the hospital. Your doctor may also give you specific instructions. While your treatment has been planned according to the most current medical practices available, unavoidable complications occasionally occur. If you have any problems or questions after discharge, please call your doctor. ACTIVITY  You may resume your regular activity but move at a slower pace for the next 24 hours.   Take frequent rest periods for the next 24 hours.   Walking will help expel (get rid of) the air and reduce the bloated feeling in your abdomen.   No driving for 24 hours (because of the anesthesia (medicine) used during the test).   You may shower.   Do not sign any important legal documents or operate any machinery for 24 hours (because of the anesthesia used during the test).  NUTRITION  Drink plenty of fluids.   You may resume your normal diet.   Begin with a light meal and progress to your normal diet.   Avoid alcoholic beverages for 24 hours or as instructed by your caregiver.  MEDICATIONS  You may resume your normal medications unless your caregiver tells you otherwise.  WHAT YOU CAN EXPECT TODAY  You may experience abdominal discomfort such as a feeling of fullness or gas pains.  FOLLOW-UP  Your doctor will discuss the results of your test with you.  SEEK IMMEDIATE MEDICAL ATTENTION IF ANY OF THE FOLLOWING OCCUR:  Excessive nausea (feeling sick to your stomach) and/or vomiting.   Severe abdominal pain and distention (swelling).   Trouble swallowing.   Temperature over 101 F (37.8 C).   Rectal bleeding or vomiting of blood.    GERD information provided  Stop omeprazole for now; begin Dexilant 60 mg daily-go by my office for free samples prior to getting  prescription filled  Office visit with [place] in 3 months

## 2018-05-05 ENCOUNTER — Encounter (HOSPITAL_COMMUNITY): Payer: Self-pay | Admitting: Internal Medicine

## 2018-05-09 ENCOUNTER — Telehealth: Payer: Self-pay | Admitting: Gastroenterology

## 2018-05-09 NOTE — Telephone Encounter (Signed)
Labs from 02/2018:  Glucose 97, BUN 11, Cre 0.77, sodium 139, albumin 4.4, tbili 0.2, alk phos 58, ast 14, alt 12, sed rate 13, wbc 9700, H/H 13.3/38.9, platelet 338,000  08/2017: Hep B surface Ag neg, Hep B core IgM neg, Hep B core Ab total neg, Hep B surface Ab neg, TSH 3.890,

## 2018-05-09 NOTE — Telephone Encounter (Signed)
abd u/s 10/2014: cholelithiasis. Mild prominence of common hepatic duct with tapering distally to 73mm.   abd u/s 07/2017: gallbladder absent, cbd 21mm.

## 2018-05-13 ENCOUNTER — Telehealth: Payer: Self-pay | Admitting: Internal Medicine

## 2018-05-13 NOTE — Telephone Encounter (Signed)
Please call patient at [phone] she has questions about dexilant

## 2018-05-13 NOTE — Telephone Encounter (Signed)
Pt was given samples of Dexilant after her procedure with RMR on 04/30/18. Please send RX to [facility]. Pt isn't sure if she received a prescription, she did receive samples.

## 2018-05-14 MED ORDER — DEXLANSOPRAZOLE 60 MG PO CPDR: 60.0000 mg | DELAYED_RELEASE_CAPSULE | Freq: Every day | ORAL | 3 refills | Status: DC

## 2018-05-14 NOTE — Telephone Encounter (Signed)
Completed Dexilant prescription to [facility].

## 2018-05-20 ENCOUNTER — Telehealth: Payer: Self-pay | Admitting: Internal Medicine

## 2018-05-20 NOTE — Telephone Encounter (Signed)
Spoke with pt. Her copay is $80.00. I gave pt the card info to activate her Dexilant savings card. She is going to see if the pharmacy will let her use this. If not she will call back in the morning.

## 2018-05-20 NOTE — Telephone Encounter (Signed)
The Dexilant Rx is too expensive and she is asking for something cheaper, She uses [facility]. [phone]

## 2018-05-21 ENCOUNTER — Telehealth: Payer: Self-pay | Admitting: Internal Medicine

## 2018-05-21 NOTE — Telephone Encounter (Signed)
Pt called to let AM know that the dexilant coupon worked and brought the cost down to $26.10

## 2018-05-21 NOTE — Telephone Encounter (Signed)
Noted  

## 2018-07-29 ENCOUNTER — Other Ambulatory Visit: Payer: Self-pay

## 2018-07-29 ENCOUNTER — Ambulatory Visit (INDEPENDENT_AMBULATORY_CARE_PROVIDER_SITE_OTHER): Payer: BLUE CROSS/BLUE SHIELD | Admitting: Gastroenterology

## 2018-07-29 ENCOUNTER — Encounter: Payer: Self-pay | Admitting: Gastroenterology

## 2018-07-29 DIAGNOSIS — G8929 Other chronic pain: Secondary | ICD-10-CM | POA: Diagnosis not present

## 2018-07-29 DIAGNOSIS — R1011 Right upper quadrant pain: Secondary | ICD-10-CM

## 2018-07-29 DIAGNOSIS — R1319 Other dysphagia: Secondary | ICD-10-CM

## 2018-07-29 DIAGNOSIS — R1310 Dysphagia, unspecified: Secondary | ICD-10-CM

## 2018-07-29 NOTE — Patient Instructions (Signed)
Continue to take Dexilant daily.  We will see you in 6-8 months!  Please call if any concerns in the meantime.  It was a pleasure to see you today. I strive to create trusting relationships with patients to provide genuine, compassionate, and quality care. I value your feedback. If you receive a survey regarding your visit,  I greatly appreciate you taking time to fill this out.   Annitta Needs, PhD, ANP-BC [facility]

## 2018-07-29 NOTE — Progress Notes (Signed)
Primary Care Physician:  Sandi Mealy, MD  Primary GI: Dr. Gala Romney  Virtual Visit via Telephone Note Due to COVID-19, visit is conducted virtually and was requested by patient.   I connected with Kendra Soto on 07/29/18 at  8:30 AM EDT by telephone and verified that I am speaking with the correct person using two identifiers.   I discussed the limitations, risks, security and privacy concerns of performing an evaluation and management service by telephone and the availability of in person appointments. I also discussed with the patient that there may be a patient responsible charge related to this service. The patient expressed understanding and agreed to proceed.  Chief Complaint  Patient presents with  . Abdominal Pain    rib area, not as bad     History of Present Illness: 43 year old female presenting in follow-up after EGD completed due to chronic RUQ pain and dysphagia. Gallbladder absent. Movement has exacerbated historically. EGD with normal esophagus s/p empiric dilatation.   Still with some pain right underneath the right rib cage. Symptoms improved overall. Dysphagia resolved. Taking Dexilant now. Can tell if she misses a day due to reflux.   No issues with constipation. Staying regular. Overall feels better since original consultation.    Past Medical History:  Diagnosis Date  . Allergies   . Asthma   . History of kidney stones   . Hypothyroid   . Overactive bladder   . RA (rheumatoid arthritis) ([place])      Past Surgical History:  Procedure Laterality Date  . CHOLECYSTECTOMY  2016  . ESOPHAGOGASTRODUODENOSCOPY N/A 04/30/2018   normal. Empiric dilatation  . MALONEY DILATION N/A 04/30/2018   Procedure: Venia Minks DILATION;  Surgeon: Daneil Dolin, MD;  Location: [facility] SUITE;  Service: Endoscopy;  Laterality: N/A;  . TUBAL LIGATION    . WISDOM TOOTH EXTRACTION       Current Meds  Medication Sig  . clobetasol ointment (TEMOVATE) 0.53 % Apply 1  application topically 2 (two) times daily as needed. Skin irritation/rash  . dexlansoprazole (DEXILANT) 60 MG capsule Take 1 capsule (60 mg total) by mouth daily.  . folic acid (FOLVITE) 1 MG tablet Take 1 mg by mouth daily.  Marland Kitchen ipratropium (ATROVENT) 0.03 % nasal spray Place 2 sprays into both nostrils 2 (two) times daily.  Marland Kitchen levothyroxine (SYNTHROID, LEVOTHROID) 50 MCG tablet Take 50 mcg by mouth daily before breakfast.   . methotrexate (RHEUMATREX) 2.5 MG tablet Take 17.5 mg by mouth every Friday at 6 PM.   . montelukast (SINGULAIR) 10 MG tablet Take 10 mg by mouth at bedtime.  . Multiple Vitamin (MULTIVITAMIN WITH MINERALS) TABS tablet Take 1 tablet by mouth every evening. Centrum Multivitamin For Women  . predniSONE (RAYOS) 5 MG TBEC Take 5 mg by mouth at bedtime.   Marland Kitchen PROAIR HFA 108 (90 Base) MCG/ACT inhaler Inhale 2 puffs into the lungs every 6 (six) hours as needed for wheezing or shortness of breath.  Marland Kitchen tiZANidine (ZANAFLEX) 4 MG tablet Take 4 mg by mouth 3 (three) times daily as needed for muscle spasms.   Marland Kitchen tolterodine (DETROL) 2 MG tablet Take 1 mg by mouth daily.     Family History  Problem Relation Age of Onset  . Cancer Father        prostate  . Hypertension Father   . Hypertension Mother   . Colon cancer Neg Hx   . Celiac disease Neg Hx   . Crohn's disease Neg Hx   .  Colon polyps Neg Hx     Social History   Socioeconomic History  . Marital status: Married    Spouse name: Not on file  . Number of children: Not on file  . Years of education: Not on file  . Highest education level: Not on file  Occupational History  . Not on file  Social Needs  . Financial resource strain: Not on file  . Food insecurity:    Worry: Not on file    Inability: Not on file  . Transportation needs:    Medical: Not on file    Non-medical: Not on file  Tobacco Use  . Smoking status: Former Research scientist (life sciences)  . Smokeless tobacco: Never Used  Substance and Sexual Activity  . Alcohol use: No  .  Drug use: No  . Sexual activity: Yes    Birth control/protection: Surgical  Lifestyle  . Physical activity:    Days per week: Not on file    Minutes per session: Not on file  . Stress: Not on file  Relationships  . Social connections:    Talks on phone: Not on file    Gets together: Not on file    Attends religious service: Not on file    Active member of club or organization: Not on file    Attends meetings of clubs or organizations: Not on file    Relationship status: Not on file  Other Topics Concern  . Not on file  Social History Narrative  . Not on file       Review of Systems: Gen: Denies fever, chills, anorexia. Denies fatigue, weakness, weight loss.  CV: Denies chest pain, palpitations, syncope, peripheral edema, and claudication. Resp: Denies dyspnea at rest, cough, wheezing, coughing up blood, and pleurisy. GI: see HPI Derm: Denies rash, itching, dry skin Psych: Denies depression, anxiety, memory loss, confusion. No homicidal or suicidal ideation.  Heme: Denies bruising, bleeding, and enlarged lymph nodes.  Observations/Objective: No distress. Video call with patient pleasant, smiling, cooperative.   Assessment and Plan: 43 year old female with chronic RUQ pain (gallbladder absent), recently with dysphagia several months ago now s/p EGD with empiric dilatation and otherwise unrevealing findings. Significant improvement with Dexilant daily. No alarm signs/symptoms.  Overall doing quite well. Continue Dexilant daily. Return in 6-8 months. If she is doing well then, can see her yearly.   Follow Up Instructions:    I discussed the assessment and treatment plan with the patient. The patient was provided an opportunity to ask questions and all were answered. The patient agreed with the plan and demonstrated an understanding of the instructions.   The patient was advised to call back or seek an in-person evaluation if the symptoms worsen or if the condition fails to  improve as anticipated.  I provided 10 minutes of face-to-face time during this video encounter.  Annitta Needs, PhD, ANP-BC [facility]

## 2018-07-29 NOTE — Progress Notes (Signed)
CC'D TO PCP °

## 2018-08-02 ENCOUNTER — Encounter: Payer: Self-pay | Admitting: Internal Medicine

## 2019-02-22 ENCOUNTER — Ambulatory Visit: Payer: BLUE CROSS/BLUE SHIELD | Admitting: Gastroenterology

## 2019-04-15 IMAGING — MG 2D DIGITAL DIAGNOSTIC BILATERAL MAMMOGRAM WITH CAD AND ADJUNCT T
6 of 10 series · 6 of 30 positions shown · non-contrast
Comparison: None available

ADDENDUM:
Outside films are now available. No interval changes or suspicious
findings.
CLINICAL DATA: Palpable lump in the right breast

EXAM:
2D DIGITAL DIAGNOSTIC BILATERAL MAMMOGRAM WITH CAD AND ADJUNCT TOMO
ULTRASOUND RIGHT BREAST

[L MLO]
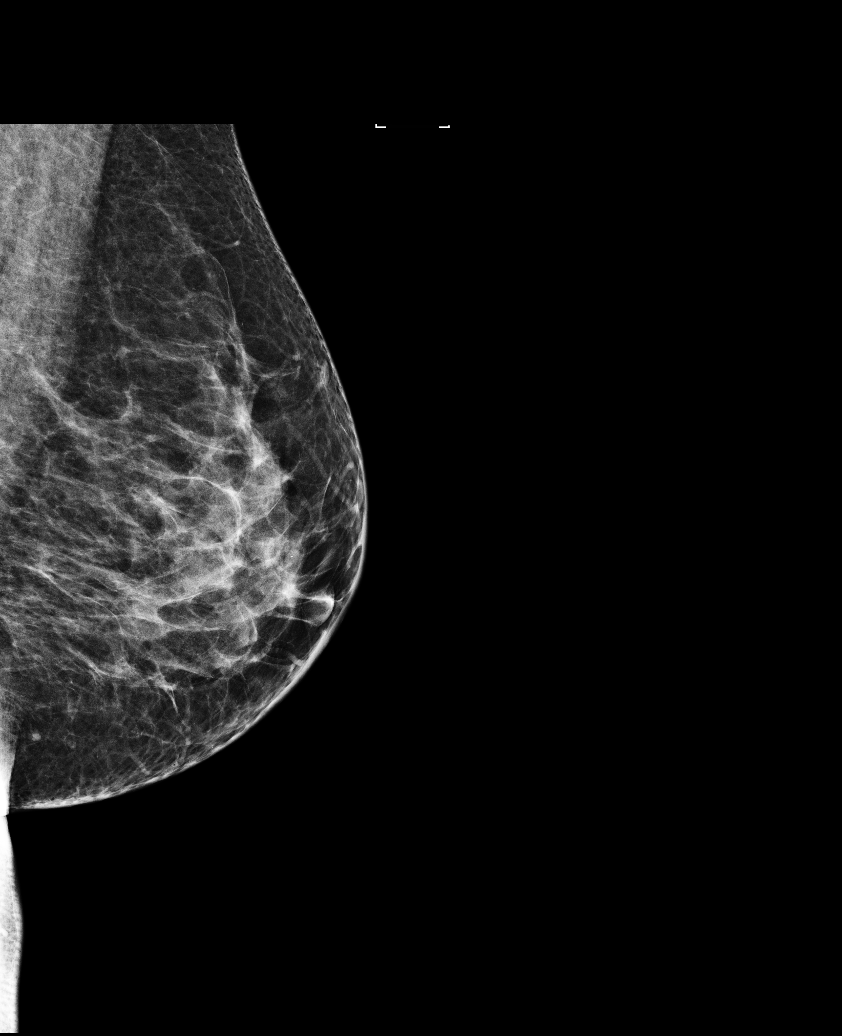

[R MLO]
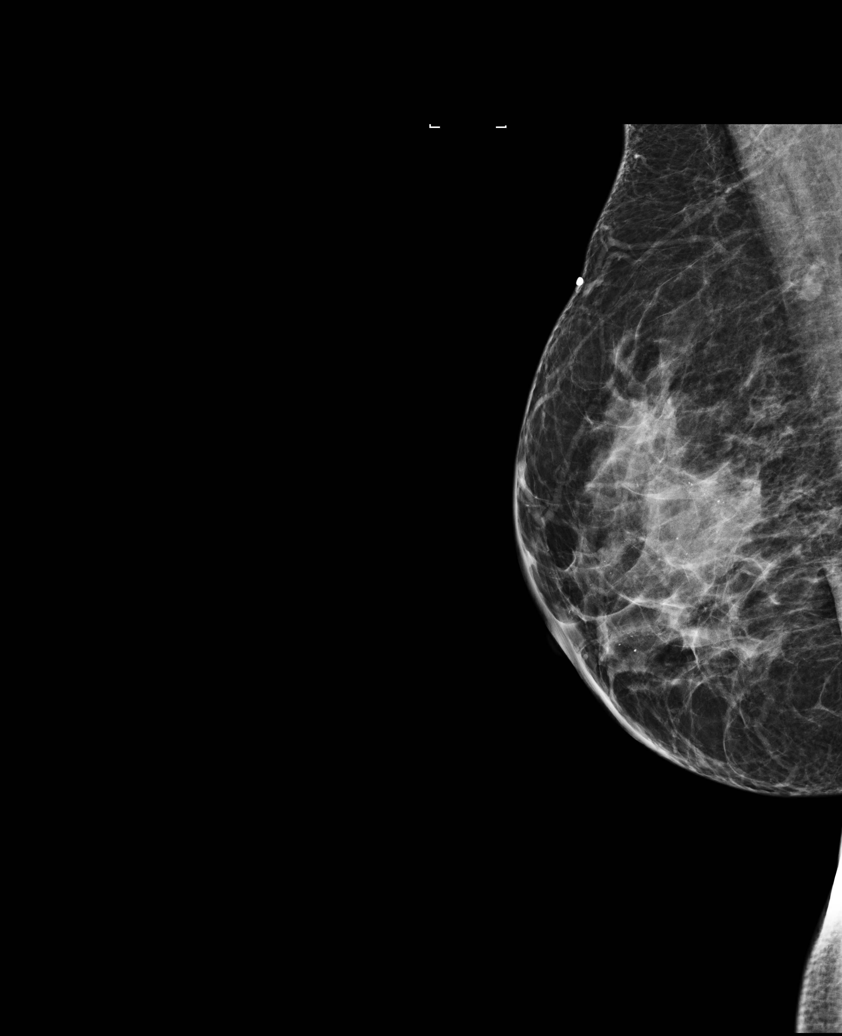

[R TAN]
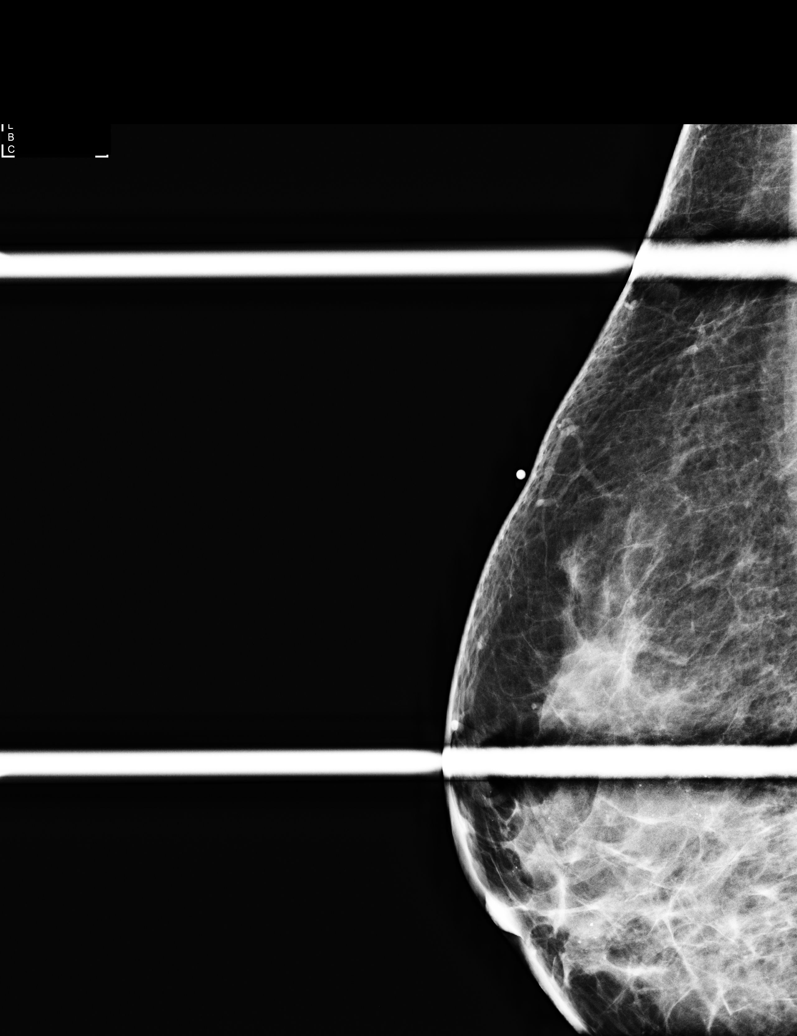

[L CC]
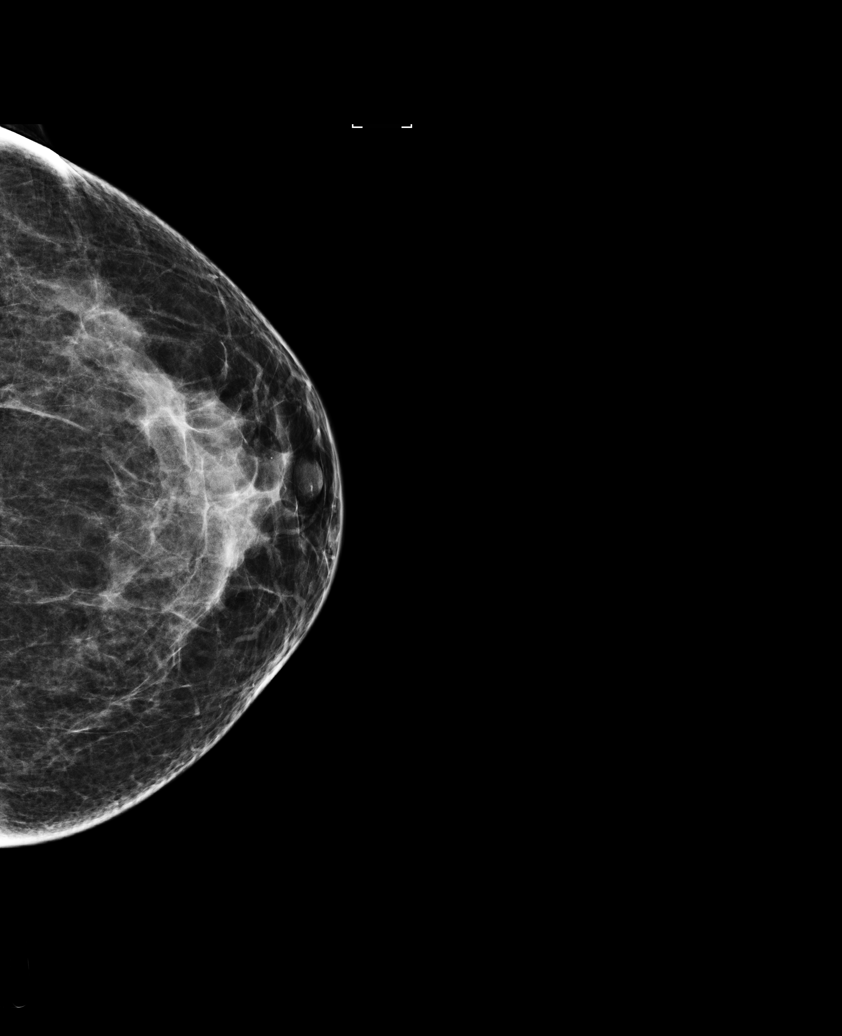

[R CC]
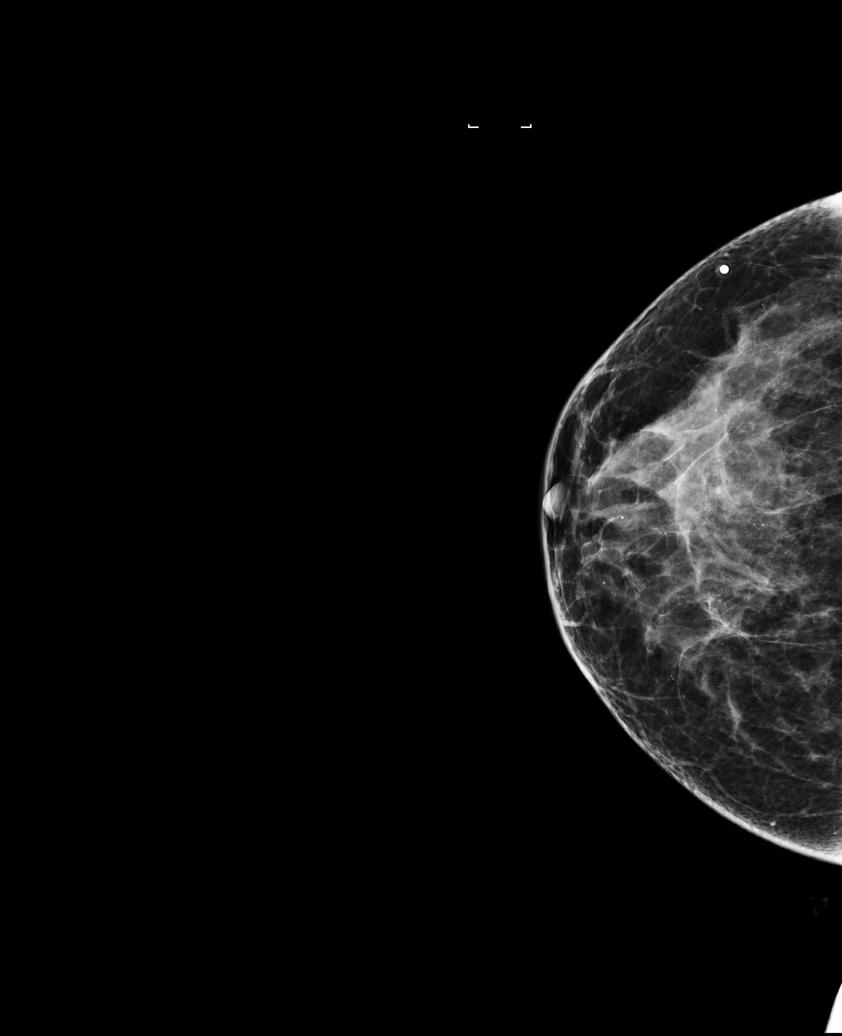

[L CC tomo · tomo slice 33/64.0]
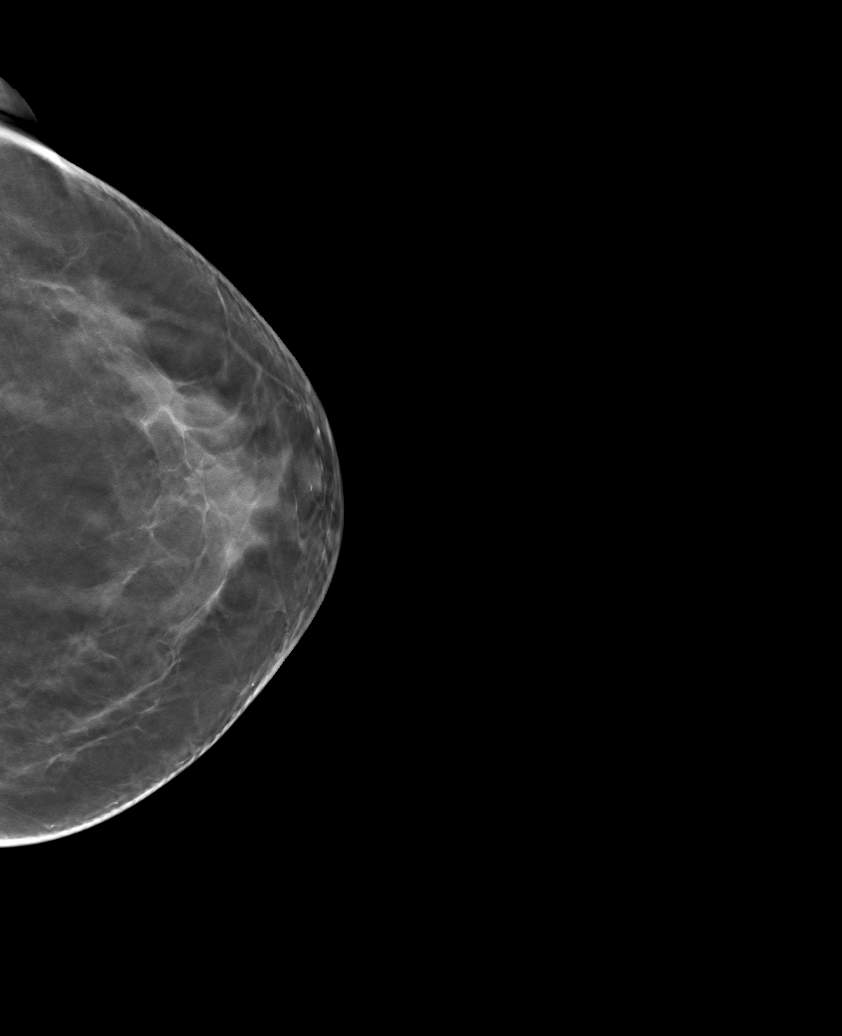

[6 of 30 positions shown; findings below may reference images not displayed]

IMPRESSION: Fibrocystic changes. No mammographic or sonographic evidence of
malignancy.

RECOMMENDATION: Treatment of the patient's symptoms should be based
on clinical and physical exam given the lack of imaging findings.
Recommend annual screening mammography.

BI-RADS category 2-benign
ACR Breast Density Category c: The breast tissue is heterogeneously
dense, which may obscure small masses.
FINDINGS: Benign calcifications are seen bilaterally. No suspicious masses
seen in either breast.

Mammographic images were processed with CAD.

On physical exam, no suspicious lumps.

Targeted ultrasound is performed, showing an island of tissue in the
region of the patient's symptoms. Adjacent to this island of tissue
are multiple simple cysts and clusters of cysts.
IMPRESSION: Fibrocystic changes. The patient appears to be feeling an island of
tissue. I final impression will be made once outside films are
available for comparison.

RECOMMENDATION:
Obtain outside films for comparison.

I have discussed the findings and recommendations with the patient.
Results were also provided in writing at the conclusion of the
visit. If applicable, a reminder letter will be sent to the patient
regarding the next appointment.

BI-RADS CATEGORY  0: Incomplete. Need additional imaging evaluation
and/or prior mammograms for comparison.

## 2020-04-11 NOTE — Progress Notes (Signed)
Office Visit Note  Patient: Kendra Soto             Date of Birth: Sep 29, 1975           MRN: [id number]             PCP: [facility] Referring: Arlyss Gandy, MD Visit Date: 04/12/2020    Subjective:  New Patient (Initial Visit) and Joint Pain (Patient complains of bilateral hand pain, bilateral shoulder pain, bilateral hip, and low back pain. )   History of Present Illness: Kendra Soto is a 45 y.o. female here for evaluation and treatment of rheumatoid arthritis. She previously saw Dr. Scarlette Shorts since 2019 for treatment on methotrexate 64m weekly. She reports needing to change rheumatology providers on account of insurance policy change and [place] was [place]. Her initial diagnosis and treatment for suspected seronegative disease with very strong family history. She denies peripheral joint swelling as a major feature, more joint pain is in the shoulders, hips, lumbar, and in hands but without visible synovitis. She was previously on Rayos as well but stopped this for concern of side effects and wanting to take less medicine. No severe flare up of symptoms after stopping. She feels she is tolerating the medication well but not sure how much this is or is not helping her current joint pain. She does have a lot of generalized symptoms including fatigue, muscle aches, dizziness, sleep disturbance, lower extremity swelling without any specific underlying medical problems of which she is aware.   Activities of Daily Living:  Patient reports morning stiffness for 5-10 minutes.   Patient Reports nocturnal pain.  Difficulty dressing/grooming: Denies Difficulty climbing stairs: Reports Difficulty getting out of chair: Reports Difficulty using hands for taps, buttons, cutlery, and/or writing: Reports  Review of Systems  Constitutional: Positive for fatigue.  HENT: Positive for mouth dryness. Negative for mouth sores and nose dryness.   Eyes: Negative for pain, itching,  visual disturbance and dryness.  Respiratory: Positive for shortness of breath and difficulty breathing. Negative for cough and hemoptysis.   Cardiovascular: Positive for irregular heartbeat and swelling in legs/feet. Negative for chest pain and palpitations.  Gastrointestinal: Positive for abdominal pain. Negative for blood in stool, constipation and diarrhea.  Endocrine: Positive for increased urination.  Genitourinary: Positive for hematuria. Negative for painful urination.  Musculoskeletal: Positive for arthralgias, joint pain, joint swelling, myalgias, muscle weakness, morning stiffness, muscle tenderness and myalgias.  Skin: Positive for color change, rash and redness.  Allergic/Immunologic: Negative for susceptible to infections.  Neurological: Positive for dizziness, headaches and weakness. Negative for numbness and memory loss.  Hematological: Negative for swollen glands.  Psychiatric/Behavioral: Positive for confusion and sleep disturbance.    PMFS History:  Patient Active Problem List   Diagnosis Date Noted  . Rheumatoid arthritis ([place] 04/12/2020  . High risk medication use 04/12/2020  . Chronic RUQ pain 04/16/2018  . Abdominal pain, chronic, epigastric 04/16/2018  . GERD (gastroesophageal reflux disease) 04/16/2018  . Esophageal dysphagia 04/16/2018    Past Medical History:  Diagnosis Date  . Allergies   . Asthma   . History of kidney stones   . Hypothyroid   . Overactive bladder   . RA (rheumatoid arthritis) (HCC)     Family History  Problem Relation Age of Onset  . Cancer Father        prostate  . Hypertension Father   . Hypertension Mother   . Rheum arthritis Sister   . Colon cancer Neg Hx   .  Celiac disease Neg Hx   . Crohn's disease Neg Hx   . Colon polyps Neg Hx    Past Surgical History:  Procedure Laterality Date  . CHOLECYSTECTOMY  2016  . ESOPHAGOGASTRODUODENOSCOPY N/A 04/30/2018   normal. Empiric dilatation  . MALONEY DILATION N/A 04/30/2018    Procedure: Venia Minks DILATION;  Surgeon: Daneil Dolin, MD;  [facility];  Service: Endoscopy;  Laterality: N/A;  . TUBAL LIGATION    . WISDOM TOOTH EXTRACTION     Social History   Social History Narrative  . Not on file    There is no immunization history on file for this patient.   Objective: Vital Signs: BP 128/81 (BP Location: Right Arm, Patient Position: Sitting, Cuff Size: Normal)   Pulse (!) 156   Ht '5\' 5"'  (1.651 m)   Wt 202 lb 9.6 oz (91.9 kg)   BMI 33.71 kg/m    Physical Exam Constitutional:      Appearance: She is obese.  HENT:     Right Ear: External ear normal.     Left Ear: External ear normal.     Mouth/Throat:     Mouth: Mucous membranes are moist.     Pharynx: Oropharynx is clear.  Eyes:     Conjunctiva/sclera: Conjunctivae normal.  Cardiovascular:     Rate and Rhythm: Regular rhythm. Tachycardia present.  Pulmonary:     Effort: Pulmonary effort is normal.     Breath sounds: Normal breath sounds.  Skin:    General: Skin is warm and dry.     Findings: No rash.  Neurological:     General: No focal deficit present.     Mental Status: She is alert.  Psychiatric:        Mood and Affect: Mood normal.     Musculoskeletal Exam:  Neck full range of motion Right shoulder tenderness near [place] joint without obvious swelling, cross arm test more painful than resisted abduction, left shoulder okay Elbow, wrist, fingers full range of motion no tenderness or swelling Normal hip internal and external rotation without pain, right side lateral hip tenderness to palpation Knees, ankles, MTPs full range of motion no swelling  CDAI Exam: CDAI Score: 0.5  Patient Global: 3 mm; Provider Global: 2 mm Swollen: 0 ; Tender: 2  Joint Exam 04/12/2020      Right  Left  Acromioclavicular   Tender     Hip   Tender        Investigation: No additional findings.  Imaging: No results found.  Recent Labs: No results found for: WBC, HGB, PLT, NA, K, CL, CO2,  GLUCOSE, BUN, CREATININE, BILITOT, ALKPHOS, AST, ALT, PROT, ALBUMIN, CALCIUM, GFRAA, QFTBGOLD, QFTBGOLDPLUS  Speciality Comments: No specialty comments available.  Procedures:  No procedures performed Allergies: Patient has no known allergies.   Assessment / Plan:     Visit Diagnoses: Rheumatoid arthritis involving multiple sites, unspecified whether rheumatoid factor present ([place]) - Plan: Sedimentation rate, Rheumatoid factor, Cyclic citrul peptide antibody, IgG  History of seronegative RA currently no evidence of disease activity on physical exam. Could be in remission on current methotrexate dose. Checking RF, CCP, ESR, CRP if also consistent with no inflammation can try stopping treatment more likely to succeed in treatment free remission with negative serology.  High risk medication use - Plan: Hepatitis panel, acute  For continuing MTX use will check baseline hepatitis serology since I was unable to review this on outside prior records. Does have recent CBC, CMP wnl.  Orders: Orders Placed This Encounter  Procedures  . Sedimentation rate  . Rheumatoid factor  . Cyclic citrul peptide antibody, IgG  . Hepatitis panel, acute   No orders of the defined types were placed in this encounter.   Follow-Up Instructions: Return in about 2 weeks (around 04/26/2020).   [name], MD  Note - This record has been created using [employer].  Chart creation errors have been sought, but may not always  have been located. Such creation errors do not reflect on  the standard of medical care.

## 2020-04-12 ENCOUNTER — Other Ambulatory Visit: Payer: Self-pay

## 2020-04-12 ENCOUNTER — Ambulatory Visit (INDEPENDENT_AMBULATORY_CARE_PROVIDER_SITE_OTHER): Payer: 59 | Admitting: Internal Medicine

## 2020-04-12 ENCOUNTER — Encounter: Payer: Self-pay | Admitting: Internal Medicine

## 2020-04-12 VITALS — BP 128/81 | HR 156 | Ht 65.0 in | Wt 202.6 lb

## 2020-04-12 DIAGNOSIS — Z79899 Other long term (current) drug therapy: Secondary | ICD-10-CM

## 2020-04-12 DIAGNOSIS — M069 Rheumatoid arthritis, unspecified: Secondary | ICD-10-CM | POA: Diagnosis not present

## 2020-04-13 LAB — HEPATITIS PANEL, ACUTE
Hep A IgM: NONREACTIVE
Hep B C IgM: NONREACTIVE
Hepatitis B Surface Ag: NONREACTIVE
Hepatitis C Ab: NONREACTIVE
SIGNAL TO CUT-OFF: 0.01 (ref <1.00)

## 2020-04-13 LAB — SEDIMENTATION RATE: Sed Rate: 6 mm/h (ref 0–20)

## 2020-04-13 LAB — RHEUMATOID FACTOR: Rheumatoid fact SerPl-aCnc: <14 IU/mL (ref <14)

## 2020-04-13 LAB — CYCLIC CITRUL PEPTIDE ANTIBODY, IGG: Cyclic Citrullin Peptide Ab: <16 UNITS

## 2020-04-16 NOTE — Progress Notes (Signed)
Lab tests are negative for RA associated antibodies and also negative for current increased inflammation. Also no lab problems for continued methotrexate use.

## 2020-04-24 NOTE — Progress Notes (Signed)
Office Visit Note  Patient: Kendra Soto             Date of Birth: [birth year]           MRN: [id number]             PCP: [facility] Referring: [facility] Visit Date: 04/25/2020  Subjective:  Follow-up (Patient has no change in symptoms since last visit. )  History of Present Illness: Kendra Soto is a 45 y.o. female here for follow up of possible rheumatoid arthritis on treatment with methotrexate 20mg  weekly. She is still not having any significant problems of joint swelling, does have some diffuse pain issues and some chronic fatigue and GI issues. She did have some right hip and right AC pain at last visit but without obvious inflammation. Labs obtained at last visit were all entirely negative for RA associated antibodies and inflammatory markers.   Review of Systems  Constitutional: Positive for fatigue.  HENT: Positive for mouth dryness. Negative for mouth sores and nose dryness.   Eyes: Negative for pain, itching, visual disturbance and dryness.  Respiratory: Positive for cough, shortness of breath and difficulty breathing. Negative for hemoptysis.   Cardiovascular: Positive for palpitations and swelling in legs/feet. Negative for chest pain.  Gastrointestinal: Negative for abdominal pain, blood in stool, constipation and diarrhea.  Endocrine: Negative for increased urination.  Genitourinary: Negative for painful urination.  Musculoskeletal: Positive for arthralgias, joint pain, joint swelling, myalgias, muscle weakness, morning stiffness, muscle tenderness and myalgias.  Skin: Positive for rash. Negative for color change and redness.  Allergic/Immunologic: Negative for susceptible to infections.  Neurological: Positive for dizziness, numbness, headaches, parasthesias and weakness. Negative for memory loss.  Hematological: Negative for swollen glands.  Psychiatric/Behavioral: Positive for confusion and sleep disturbance.    PMFS History:   Patient Active Problem List   Diagnosis Date Noted  . Rheumatoid arthritis ([place]) 04/12/2020  . High risk medication use 04/12/2020  . Chronic RUQ pain 04/16/2018  . Abdominal pain, chronic, epigastric 04/16/2018  . GERD (gastroesophageal reflux disease) 04/16/2018  . Esophageal dysphagia 04/16/2018    Past Medical History:  Diagnosis Date  . Allergies   . Asthma   . History of kidney stones   . Hypothyroid   . Overactive bladder   . RA (rheumatoid arthritis) (HCC)     Family History  Problem Relation Age of Onset  . Cancer Father        prostate  . Hypertension Father   . Hypertension Mother   . Rheum arthritis Sister   . Colon cancer Neg Hx   . Celiac disease Neg Hx   . Crohn's disease Neg Hx   . Colon polyps Neg Hx    Past Surgical History:  Procedure Laterality Date  . CHOLECYSTECTOMY  2016  . ESOPHAGOGASTRODUODENOSCOPY N/A 04/30/2018   normal. Empiric dilatation  . MALONEY DILATION N/A 04/30/2018   Procedure: Venia Minks DILATION;  Surgeon: Daneil Dolin, MD;  [facility];  Service: Endoscopy;  Laterality: N/A;  . TUBAL LIGATION    . WISDOM TOOTH EXTRACTION     Social History   Social History Narrative  . Not on file    There is no immunization history on file for this patient.   Objective: Vital Signs: BP 109/71 (BP Location: Left Arm, Patient Position: Sitting, Cuff Size: Normal)   Pulse 69   Ht 5\' 6"  (1.676 m)   Wt 200 lb 12.8 oz (91.1 kg)   BMI 32.41  kg/m    Physical Exam Constitutional:      Appearance: She is obese.  Eyes:     Conjunctiva/sclera: Conjunctivae normal.  Skin:    General: Skin is warm and dry.     Findings: No rash.  Neurological:     General: No focal deficit present.     Mental Status: She is alert.  Psychiatric:        Mood and Affect: Mood normal.     Musculoskeletal Exam:  Elbows full ROM no tenderness or swelling Wrists full ROM no tenderness or swelling Fingers full ROM no tenderness or swelling Knees  full ROM no tenderness or swelling Ankles full ROM no tenderness or swelling   Investigation: No additional findings.  Imaging: No results found.  Recent Labs: No results found for: WBC, HGB, PLT, NA, K, CL, CO2, GLUCOSE, BUN, CREATININE, BILITOT, ALKPHOS, AST, ALT, PROT, ALBUMIN, CALCIUM, GFRAA, QFTBGOLD, QFTBGOLDPLUS  Speciality Comments: No specialty comments available.  Procedures:  No procedures performed Allergies: Patient has no known allergies.   Assessment / Plan:     Visit Diagnoses: High risk medication use  Labs for MTX use look okay negative hepatitis panel, however not clear current clinical indication for this so does not outweigh risks. Can stop MTX and recommend can stop folic acid 2 wks later if doing okay off it.  Rheumatoid arthritis of multiple sites with negative rheumatoid factor (HCC)  No evidence of inflammatory disease activity at this time. Recommend discontinuing methotrexate entirely and just self monitor for any flare in symptoms. I suspect right shoulder and hip problems are non-inflammatory but not severe enough currently to need an orthopedic assessment.  Orders: No orders of the defined types were placed in this encounter.  No orders of the defined types were placed in this encounter.   Follow-Up Instructions: Return if symptoms worsen or fail to improve.   [name], MD  Note - This record has been created using [employer].  Chart creation errors have been sought, but may not always  have been located. Such creation errors do not reflect on  the standard of medical care.

## 2020-04-25 ENCOUNTER — Encounter: Payer: Self-pay | Admitting: Internal Medicine

## 2020-04-25 ENCOUNTER — Ambulatory Visit (INDEPENDENT_AMBULATORY_CARE_PROVIDER_SITE_OTHER): Payer: 59 | Admitting: Internal Medicine

## 2020-04-25 ENCOUNTER — Other Ambulatory Visit: Payer: Self-pay

## 2020-04-25 VITALS — BP 109/71 | HR 69 | Ht 66.0 in | Wt 200.8 lb

## 2020-04-25 DIAGNOSIS — M0609 Rheumatoid arthritis without rheumatoid factor, multiple sites: Secondary | ICD-10-CM

## 2020-04-25 DIAGNOSIS — Z79899 Other long term (current) drug therapy: Secondary | ICD-10-CM

## 2020-04-25 NOTE — Patient Instructions (Addendum)
I recommend stopping methotrexate at this time and we will see if there is any change in symptoms. You can discontinue folic acid supplement 2 weeks after last dose of this. If symptoms worsen please give [place] a call or message by mychart portal.  I do not see a current cause of all over body pains on exam. This could be coming from a chronic pain syndrome such as fibromyalgia or myofascial pain. I recommend checking out the [place] patient-centered guide for fibromyalgia and chronic pain management: [email or web address]

## 2020-05-08 ENCOUNTER — Other Ambulatory Visit: Payer: Self-pay | Admitting: Family Medicine

## 2020-05-08 ENCOUNTER — Other Ambulatory Visit: Payer: Self-pay

## 2020-05-08 ENCOUNTER — Ambulatory Visit (INDEPENDENT_AMBULATORY_CARE_PROVIDER_SITE_OTHER): Payer: 59 | Admitting: Family Medicine

## 2020-05-08 ENCOUNTER — Encounter: Payer: Self-pay | Admitting: Family Medicine

## 2020-05-08 VITALS — BP 111/75 | HR 89 | Ht 66.0 in | Wt 199.0 lb

## 2020-05-08 DIAGNOSIS — N939 Abnormal uterine and vaginal bleeding, unspecified: Secondary | ICD-10-CM | POA: Diagnosis not present

## 2020-05-08 NOTE — Progress Notes (Signed)
   GYNECOLOGY PROBLEM  VISIT ENCOUNTER NOTE  Subjective:   Kendra Soto is a 45 y.o. G26P3003 female here for a a problem GYN visit.  Current complaints:   AUB:  Pt endorses 6-7 month history of AUB. She used to have cycles every 28 days but now they are more frequent occurring every 2-3 weeks. LMP was 28th Jan lasting 3 weeks. Initially heavy with clots and then became lighter with spotting which is abnormal for her. No intermenstrual bleeding. No post coital bleeding but does have some dyspareunia on deep thrusting. Assoc sx include: right sided lower back pain and intermittent abdominal cramps. Recently stopped methotrexate.   Denies abnormal vaginal bleeding, discharge, pelvic pain, problems with intercourse or other gynecologic concerns.    Gynecologic History Patient's last menstrual period was 04/13/2020 (exact date). Contraception: tubal ligation  Health Maintenance Due  Topic Date Due  . COVID-19 Vaccine (1) Never done  . HIV Screening  Never done  . COLONOSCOPY (Pts 45-54yrs Insurance coverage will need to be confirmed)  Never done  . INFLUENZA VACCINE  Never done     The following portions of the patient's history were reviewed and updated as appropriate: allergies, current medications, past family history, past medical history, past social history, past surgical history and problem list.  Review of Systems Pertinent items are noted in HPI.   Objective:  BP 111/75 (BP Location: Right Arm, Patient Position: Sitting, Cuff Size: Normal)   Pulse 89   Ht 5\' 6"  (1.676 m)   Wt 199 lb (90.3 kg)   LMP 04/13/2020 (Exact Date)   BMI 32.12 kg/m  Gen: well appearing, NAD HEENT: no scleral icterus CV: RR Lung: Normal WOB Ext: warm well perfused  PELVIC: Normal appearing external genitalia; normal appearing vaginal mucosa. Cervix appears to have prominent ectropion with beefy red appearance to entire cervix.   No abnormal discharge noted.   Normal uterine size (7-8cm), no other  palpable masses, no uterine or adnexal tenderness.   ENDOMETRIAL BIOPSY     The indications for endometrial biopsy were reviewed.   Risks of the biopsy including cramping, bleeding, infection, uterine perforation, inadequate specimen and need for additional procedures  were discussed. The patient states she understands and agrees to undergo procedure today. Consent was signed. Time out was performed. Urine HCG was negative. During the pelvic exam, the cervix was prepped with Betadine. A single-toothed tenaculum was placed on the anterior lip of the cervix to stabilize it. The 3 mm pipelle was introduced into the endometrial cavity without difficulty to a depth of 7cm, and a moderate-large (8-9cm) amount of tissue was obtained and sent to pathology. The instruments were removed from the patient's vagina. Minimal bleeding from the cervix was noted. The patient tolerated the procedure well. Routine post-procedure instructions were given to the patient.      Assessment and Plan:   1. Abnormal uterine bleeding ddx includes normal variant, polyp, endometrial hyperplasia vs endometrial CA.  TSH wnl at PCP.  - Surgical pathology( [facility]/ [facility]) - US PELVIS (TRANSABDOMINAL ONLY); Future   Please refer to After Visit Summary for other counseling recommendations.   Return in about 2 weeks (around 05/22/2020) for [place] and MD visit to talk about managment of AUB.  Caren Macadam, MD, MPH, ABFM Attending [place] for [facility]

## 2020-05-28 ENCOUNTER — Ambulatory Visit (INDEPENDENT_AMBULATORY_CARE_PROVIDER_SITE_OTHER): Payer: 59

## 2020-05-28 ENCOUNTER — Encounter: Payer: Self-pay | Admitting: Obstetrics & Gynecology

## 2020-05-28 ENCOUNTER — Other Ambulatory Visit: Payer: Self-pay

## 2020-05-28 ENCOUNTER — Ambulatory Visit: Payer: 59 | Admitting: Obstetrics & Gynecology

## 2020-05-28 VITALS — BP 107/64 | HR 68 | Ht 65.0 in | Wt 201.8 lb

## 2020-05-28 DIAGNOSIS — N939 Abnormal uterine and vaginal bleeding, unspecified: Secondary | ICD-10-CM

## 2020-05-28 DIAGNOSIS — G43111 Migraine with aura, intractable, with status migrainosus: Secondary | ICD-10-CM

## 2020-05-28 MED ORDER — NORETHINDRONE 0.35 MG PO TABS: 1.0000 | ORAL_TABLET | Freq: Every day | ORAL | 3 refills | Status: DC

## 2020-05-28 NOTE — Progress Notes (Signed)
PELVIC US TA/TV: heterogeneous anteverted uterus,posterior left subserosal fibroid 2.4 x 2.4 x 2.1 cm,inhomogeneous endometrium (no color flow),EEC 19 mm,normal ovaries,no free fluid,ovaries appear mobile,no pain during ultrasound  Chaperone Peggy

## 2020-05-28 NOTE — Progress Notes (Signed)
GYN VISIT Patient name: Kendra Soto MRN [id number]  Date of birth: 1975/06/16 Chief Complaint:   Follow-up  History of Present Illness:   Kendra Soto is a 45 y.o. G7P3003 female being seen today for follow up regarding AUB: -Last seen 05/08/2020- EMB completed benign -[place] today (05/28/2020)- 8.8cm uterus with 2.4cm left subserosal fibroid noted, no other abnormalities. Normal ovaries bilaterally  AUB- started in Jan- had 3wk period- initially very heavy soaking through 7-8 pads per day, then moderate then lighter bleeding.  Had one week off then period just restarted again on 3/1 back to the heavy bleeding with passage of clots.  Denies significant pelvic or abdominal pain.  Prior to this menses were regular each month     Patient's last menstrual period was 05/15/2020 (exact date).  Depression screen [facility] 2/9 05/08/2020  Decreased Interest 1  Down, Depressed, Hopeless 0  PHQ - 2 Score 1  Altered sleeping 2  Tired, decreased energy 2  Change in appetite 0  Feeling bad or failure about yourself  0  Trouble concentrating 2  Moving slowly or fidgety/restless 0  Suicidal thoughts 0  PHQ-9 Score 7     Current Outpatient Medications  Medication Instructions  . clobetasol ointment (TEMOVATE) 8.41 % 1 application, Topical, 2 times daily PRN, Skin irritation/rash  . fluticasone (FLONASE) 50 MCG/ACT nasal spray 2 sprays, Each Nare, Daily  . furosemide (LASIX) 20 mg, Oral, Daily  . ipratropium (ATROVENT) 0.03 % nasal spray 2 sprays, Each Nare, 2 times daily  . levocetirizine (XYZAL) 5 mg, Oral, Daily  . levothyroxine (SYNTHROID) 50 mcg, Oral, Daily before breakfast  . meloxicam (MOBIC) 15 mg, Oral, Daily  . metoprolol succinate (TOPROL-XL) 25 mg, Oral, Every morning  . montelukast (SINGULAIR) 10 mg, Oral, Daily at bedtime  . Multiple Vitamin (MULTIVITAMIN WITH MINERALS) TABS tablet 1 tablet, Oral, Every evening, Centrum Multivitamin For Women   . omeprazole (PRILOSEC) 20 mg, Oral, Daily   . PROAIR HFA 108 (90 Base) MCG/ACT inhaler 2 puffs, Inhalation, Every 6 hours PRN  . SUMAtriptan (IMITREX) 100 mg, Oral, As directed  . tiZANidine (ZANAFLEX) 4 mg, Oral, 3 times daily PRN  . tolterodine (DETROL) 1 mg, Oral, As needed     Review of Systems:   Pertinent items are noted in HPI Denies fever/chills, dizziness, headaches, visual disturbances, fatigue, shortness of breath, chest pain, abdominal pain, vomiting, abnormal vaginal discharge/itching/odor/irritation, problems with periods, bowel movements, urination, or intercourse unless otherwise stated above.  Pertinent History Reviewed:  Reviewed past medical,surgical, social, obstetrical and family history.  Reviewed problem list, medications and allergies. Physical Assessment:   Vitals:   05/28/20 1637  BP: 107/64  Pulse: 68  Weight: 201 lb 12.8 oz (91.5 kg)  Height: 5\' 5"  (1.651 m)  Body mass index is 33.58 kg/m.       Physical Examination:   General appearance: alert, well appearing, and in no distress  Mental status: alert, oriented to person, place, and time  Skin: warm & dry   Cardiovascular: normal heart rate noted  Respiratory: normal respiratory effort, no distress  Abdomen: soft, non-tender, uterine not enlarged  Pelvic: deferred  Extremities: no edema   Chaperone: N/A    Assessment & Plan:  1) AUB Reviewed [place] results and prior EMB.  Reassured pt that unfortunately this is very common for perimenopause.  Discussed all management options from pills to surgery.  Pt given handout with options.   -plan for POPs - due to h.o migraines with aura -lab  work today to r/o underlying anemia -pt would desire endometrial ablation pending cost- referral created  Orders Placed This Encounter  Procedures  . CBC  . Iron, TIBC and Ferritin Panel    Janyth Pupa, DO Attending [place], [facility] for [employer], [place]

## 2020-05-29 ENCOUNTER — Telehealth: Payer: Self-pay | Admitting: Obstetrics & Gynecology

## 2020-05-29 NOTE — Telephone Encounter (Signed)
Spoke with Patient today regarding her medication and if she would like to schedule her pre op for surgery. Patient states that she was not able to get her medication yesterday. patient states that she got them today and is going to start taking them. Patient would like to wait a couple of weeks to see if the medication works before she schedules for a pre op for surgery.

## 2020-05-30 LAB — CBC
Hematocrit: 34.5 % (ref 34.0–46.6)
Hemoglobin: 11.3 g/dL (ref 11.1–15.9)
MCH: 30.8 pg (ref 26.6–33.0)
MCHC: 32.8 g/dL (ref 31.5–35.7)
MCV: 94 fL (ref 79–97)
Platelets: 344 10*3/uL (ref 150–450)
RBC: 3.67 x10E6/uL — ABNORMAL LOW (ref 3.77–5.28)
RDW: 12.6 % (ref 11.7–15.4)
WBC: 8.6 10*3/uL (ref 3.4–10.8)

## 2020-05-30 LAB — IRON,TIBC AND FERRITIN PANEL
Ferritin: 15 ng/mL (ref 15–150)
Iron Saturation: 22 % (ref 15–55)
Iron: 72 ug/dL (ref 27–159)
Total Iron Binding Capacity: 326 ug/dL (ref 250–450)
UIBC: 254 ug/dL (ref 131–425)

## 2020-12-17 ENCOUNTER — Telehealth (INDEPENDENT_AMBULATORY_CARE_PROVIDER_SITE_OTHER): Payer: 59 | Admitting: Family Medicine

## 2020-12-17 ENCOUNTER — Other Ambulatory Visit: Payer: Self-pay

## 2020-12-17 ENCOUNTER — Encounter: Payer: Self-pay | Admitting: Family Medicine

## 2020-12-17 DIAGNOSIS — N939 Abnormal uterine and vaginal bleeding, unspecified: Secondary | ICD-10-CM

## 2020-12-17 DIAGNOSIS — Z3041 Encounter for surveillance of contraceptive pills: Secondary | ICD-10-CM | POA: Diagnosis not present

## 2020-12-17 NOTE — Progress Notes (Signed)
Discuss medication she has been on, causing headaches and weight gain

## 2020-12-17 NOTE — Progress Notes (Signed)
   TELEHEALTH GYNECOLOGY VISIT ENCOUNTER NOTE  Provider location: [facility]   Patient location: Home  I connected with [name] on 12/21/20 at  3:10 PM EDT by telephone and verified that I am speaking with the correct person using two identifiers. Patient was unable to do MyChart audiovisual encounter due to technical difficulties, she tried several times.    I discussed the limitations, risks, security and privacy concerns of performing an evaluation and management service by telephone and the availability of in person appointments. I also discussed with the patient that there may be a patient responsible charge related to this service. The patient expressed understanding and agreed to proceed.   History:  Kendra Soto is a 45 y.o. G81P3003 female being evaluated today for AUB with POP use. She denies any abnormal vaginal discharge, bleeding, pelvic pain or other concerns.       Past Medical History:  Diagnosis Date   Allergies    Asthma    History of kidney stones    Hypothyroid    Overactive bladder    Past Surgical History:  Procedure Laterality Date   CHOLECYSTECTOMY  2016   ESOPHAGOGASTRODUODENOSCOPY N/A 04/30/2018   normal. Empiric dilatation   MALONEY DILATION N/A 04/30/2018   Procedure: Venia Minks DILATION;  Surgeon: Daneil Dolin, MD;  Location: [facility];  Service: Endoscopy;  Laterality: N/A;   TUBAL LIGATION     WISDOM TOOTH EXTRACTION     The following portions of the patient's history were reviewed and updated as appropriate: allergies, current medications, past family history, past medical history, past social history, past surgical history and problem list.   Health Maintenance:  Normal pap and negative HRHPV   Review of Systems:  Pertinent items noted in HPI and remainder of comprehensive ROS otherwise negative.  Physical Exam:   General:  Alert, oriented and cooperative.   Mental Status: Normal mood and affect  perceived. Normal judgment and thought content.  Physical exam deferred due to nature of the encounter  Labs and Imaging No results found for this or any previous visit (from the past 336 hour(s)). No results found.    Assessment and Plan:     1. Abnormal uterine bleeding Discussed options for further management including LNG IUD, depo and POP. She has failed POP with continued irregular bleeding.  EMB was negative in 2022.  Desires to proceed with ablation. Message sent to [facility] and Dr. Nelda Marseille for scheduling.   2. Encounter for surveillance of contraceptive pills Continued POP until surgery.       I discussed the assessment and treatment plan with the patient. The patient was provided an opportunity to ask questions and all were answered. The patient agreed with the plan and demonstrated an understanding of the instructions.   The patient was advised to call back or seek an in-person evaluation/go to the ED if the symptoms worsen or if the condition fails to improve as anticipated.  I provided 15 minutes of non-face-to-face time during this encounter.   Caren Macadam, MD [facility] for [facility], [facility]

## 2020-12-21 ENCOUNTER — Encounter: Payer: Self-pay | Admitting: Family Medicine

## 2021-01-02 ENCOUNTER — Ambulatory Visit: Payer: 59 | Admitting: Obstetrics & Gynecology

## 2021-01-02 ENCOUNTER — Other Ambulatory Visit: Payer: Self-pay

## 2021-01-02 ENCOUNTER — Encounter: Payer: Self-pay | Admitting: Obstetrics & Gynecology

## 2021-01-02 VITALS — BP 93/63 | HR 71 | Ht 65.0 in | Wt 209.0 lb

## 2021-01-02 DIAGNOSIS — Z01818 Encounter for other preprocedural examination: Secondary | ICD-10-CM | POA: Diagnosis not present

## 2021-01-02 DIAGNOSIS — N939 Abnormal uterine and vaginal bleeding, unspecified: Secondary | ICD-10-CM

## 2021-01-02 NOTE — Progress Notes (Signed)
   GYN VISIT Patient name: Kendra Soto MRN [id number]  Date of birth: December 28, 1975 Chief Complaint:   Follow-up (Wants to talk about surgical management)  History of Present Illness:   Kendra Soto is a 45 y.o. G46P3003  female being seen today for AUB.  Last visit in March- treated with POP.  While the pill has slowed them down,     did nothing to regulate the period.  Additionally noted worsening of headaches.  Menses still about 6-7 pads per day on the first few days, then does slow down a lot- typically only changing because of hygeine.  Using only a liner for the last several days. Menses can be q 2-5wks apart, she never knows timing.  Prior work up completed:  [place]- (05/28/2020)- 8.8cm uterus with 2.4cm left subserosal fibroid noted, no other abnormalities. Normal ovaries bilaterally  05/08/2020- EMB benign  No LMP recorded. (Menstrual status: Oral contraceptives).  Depression screen [facility] 2/9 05/08/2020  Decreased Interest 1  Down, Depressed, Hopeless 0  PHQ - 2 Score 1  Altered sleeping 2  Tired, decreased energy 2  Change in appetite 0  Feeling bad or failure about yourself  0  Trouble concentrating 2  Moving slowly or fidgety/restless 0  Suicidal thoughts 0  PHQ-9 Score 7     Review of Systems:   Pertinent items are noted in HPI Denies fever/chills, dizziness, headaches, visual disturbances, fatigue, shortness of breath, chest pain, abdominal pain, vomiting, bowel movements, urination, or intercourse unless otherwise stated above.  Pertinent History Reviewed:  Reviewed past medical,surgical, social, obstetrical and family history.  Prior surgery- tubal ligation, choly Reviewed problem list, medications and allergies. Physical Assessment:   Vitals:   01/02/21 0903  BP: 93/63  Pulse: 71  Weight: 209 lb (94.8 kg)  Height: 5\' 5"  (1.651 m)  Body mass index is 34.78 kg/m.       Physical Examination:   General appearance: alert, well appearing, and in no  distress  Psych: mood appropriate, normal affect  Skin: warm & dry   Cardiovascular: normal heart rate noted  Respiratory: normal respiratory effort, no distress  Abdomen: soft, non-tender, no rebound, no guarding  Pelvic: normal external genitalia, vulva, vagina, cervix, uterus and adnexa- uterus mobile with some descent  Extremities: no edema   Chaperone: Celene Squibb    Assessment & Plan:  1) AUB -Reviewed all options including IUD, ablation or further surgical intervention such as hysterectomy -Discussed limitation of ablation- may not change frequency of bleeding.  Ideally decrease bleeding to significantly improve quality of life -discussed risk/benefit of each option -discussed either LAVH or vag hyst (with possibility of not being able to complete salpingectomy) -reviewed ovarian preservation -questions and concerns were addressed -Planning to take of her grandchild and concerned about recovery from hysterectomy.  Leaning towards ablation at next available -Referral sent, plan for surgery in November  Return for to be determined.   Janyth Pupa, DO Attending [place], [facility] for [employer], [place]

## 2021-01-08 ENCOUNTER — Telehealth: Payer: Self-pay | Admitting: Obstetrics & Gynecology

## 2021-01-08 NOTE — Telephone Encounter (Signed)
Patient called stating that she would like to speak with Dr. Nelda Marseille regarding maybe changing her surgery to Hysterectomy instead. Patient states that she is leaning more towards thi. Please contact pt

## 2021-01-08 NOTE — Telephone Encounter (Signed)
Called patient back.  She reports that she has started having her menses again only after 2 wks.  After much discussion with her family, she is leaning towards hysterectomy.  Discussed plan to [facility], possible bilateral salpingectomy.  Reviewed risk/benefit and discussed recovery.  Previous concern regarding childcare of grandchild. Pt does confirm that she will be able to take several week off.  Will speak with Daisy to confirm coverage and if possible adjust schedule.  Janyth Pupa, DO Attending [place], [facility] for [employer], [place]

## 2021-01-16 NOTE — Patient Instructions (Signed)
Your procedure is scheduled on: 01/22/2021  Report to [facility] at    6:15 AM.  Call this number if you have problems the morning of surgery: [phone]   Remember:   Do not Eat or Drink after midnight         No Smoking the morning of surgery  :  Take these medicines the morning of surgery with A SIP OF WATER: Metoprolol, Meloxicam, levothyroxine, Omeprazole, and Ditropan  Ubrogepant, excedrin migraine, inhalers  and/or flonase if needed    Do not wear jewelry, make-up or nail polish.  Do not wear lotions, powders, or perfumes. You may wear deodorant.  Do not shave 48 hours prior to surgery. Men may shave face and neck.  Do not bring valuables to the hospital.  Contacts, dentures or bridgework may not be worn into surgery.  Leave suitcase in the car. After surgery it may be brought to your room.  For patients admitted to the hospital, checkout time is 11:00 AM the day of discharge.   Patients discharged the day of surgery will not be allowed to drive home.    Special Instructions: Shower using CHG night before surgery and shower the day of surgery use CHG.  Use special wash - you have one bottle of CHG for all showers.  You should use approximately 1/2 of the bottle for each shower.  How to Use Chlorhexidine for Bathing Chlorhexidine gluconate (CHG) is a germ-killing (antiseptic) solution that is used to clean the skin. It can get rid of the bacteria that normally live on the skin and can keep them away for about 24 hours. To clean your skin with CHG, you may be given: A CHG solution to use in the shower or as part of a sponge bath. A prepackaged cloth that contains CHG. Cleaning your skin with CHG may help lower the risk for infection: While you are staying in the intensive care unit of the hospital. If you have a vascular access, such as a central line, to provide short-term or long-term access to your veins. If you have a catheter to drain urine from your  bladder. If you are on a ventilator. A ventilator is a machine that helps you breathe by moving air in and out of your lungs. After surgery. What are the risks? Risks of using CHG include: A skin reaction. Hearing loss, if CHG gets in your ears and you have a perforated eardrum. Eye injury, if CHG gets in your eyes and is not rinsed out. The CHG product catching fire. Make sure that you avoid smoking and flames after applying CHG to your skin. Do not use CHG: If you have a chlorhexidine allergy or have previously reacted to chlorhexidine. On babies younger than 7 months of age. How to use CHG solution Use CHG only as told by your health care provider, and follow the instructions on the label. Use the full amount of CHG as directed. Usually, this is one bottle. During a shower Follow these steps when using CHG solution during a shower (unless your health care provider gives you different instructions): Start the shower. Use your normal soap and shampoo to wash your face and hair. Turn off the shower or move out of the shower stream. Pour the CHG onto a clean washcloth. Do not use any type of brush or rough-edged sponge. Starting at your neck, lather your body down to your toes. Make sure you follow these instructions: If you will be having surgery,  pay special attention to the part of your body where you will be having surgery. Scrub this area for at least 1 minute. Do not use CHG on your head or face. If the solution gets into your ears or eyes, rinse them well with water. Avoid your genital area. Avoid any areas of skin that have broken skin, cuts, or scrapes. Scrub your back and under your arms. Make sure to wash skin folds. Let the lather sit on your skin for 1-2 minutes or as long as told by your health care provider. Thoroughly rinse your entire body in the shower. Make sure that all body creases and crevices are rinsed well. Dry off with a clean towel. Do not put any substances on  your body afterward--such as powder, lotion, or perfume--unless you are told to do so by your health care provider. Only use lotions that are recommended by the manufacturer. Put on clean clothes or pajamas. If it is the night before your surgery, sleep in clean sheets.  During a sponge bath Follow these steps when using CHG solution during a sponge bath (unless your health care provider gives you different instructions): Use your normal soap and shampoo to wash your face and hair. Pour the CHG onto a clean washcloth. Starting at your neck, lather your body down to your toes. Make sure you follow these instructions: If you will be having surgery, pay special attention to the part of your body where you will be having surgery. Scrub this area for at least 1 minute. Do not use CHG on your head or face. If the solution gets into your ears or eyes, rinse them well with water. Avoid your genital area. Avoid any areas of skin that have broken skin, cuts, or scrapes. Scrub your back and under your arms. Make sure to wash skin folds. Let the lather sit on your skin for 1-2 minutes or as long as told by your health care provider. Using a different clean, wet washcloth, thoroughly rinse your entire body. Make sure that all body creases and crevices are rinsed well. Dry off with a clean towel. Do not put any substances on your body afterward--such as powder, lotion, or perfume--unless you are told to do so by your health care provider. Only use lotions that are recommended by the manufacturer. Put on clean clothes or pajamas. If it is the night before your surgery, sleep in clean sheets. How to use CHG prepackaged cloths Only use CHG cloths as told by your health care provider, and follow the instructions on the label. Use the CHG cloth on clean, dry skin. Do not use the CHG cloth on your head or face unless your health care provider tells you to. When washing with the CHG cloth: Avoid your genital  area. Avoid any areas of skin that have broken skin, cuts, or scrapes. Before surgery Follow these steps when using a CHG cloth to clean before surgery (unless your health care provider gives you different instructions): Using the CHG cloth, vigorously scrub the part of your body where you will be having surgery. Scrub using a back-and-forth motion for 3 minutes. The area on your body should be completely wet with CHG when you are done scrubbing. Do not rinse. Discard the cloth and let the area air-dry. Do not put any substances on the area afterward, such as powder, lotion, or perfume. Put on clean clothes or pajamas. If it is the night before your surgery, sleep in clean sheets.  For general  bathing Follow these steps when using CHG cloths for general bathing (unless your health care provider gives you different instructions). Use a separate CHG cloth for each area of your body. Make sure you wash between any folds of skin and between your fingers and toes. Wash your body in the following order, switching to a new cloth after each step: The front of your neck, shoulders, and chest. Both of your arms, under your arms, and your hands. Your stomach and groin area, avoiding the genitals. Your right leg and foot. Your left leg and foot. The back of your neck, your back, and your buttocks. Do not rinse. Discard the cloth and let the area air-dry. Do not put any substances on your body afterward--such as powder, lotion, or perfume--unless you are told to do so by your health care provider. Only use lotions that are recommended by the manufacturer. Put on clean clothes or pajamas. Contact a health care provider if: Your skin gets irritated after scrubbing. You have questions about using your solution or cloth. You swallow any chlorhexidine. Call your local poison control center (1-4075008543 in the [place] Get help right away if: Your eyes itch badly, or they become very red or swollen. Your  skin itches badly and is red or swollen. Your hearing changes. You have trouble seeing. You have swelling or tingling in your mouth or throat. You have trouble breathing. These symptoms may represent a serious problem that is an emergency. Do not wait to see if the symptoms will go away. Get medical help right away. Call your local emergency services (911 in the [place] Do not drive yourself to the hospital. Summary Chlorhexidine gluconate (CHG) is a germ-killing (antiseptic) solution that is used to clean the skin. Cleaning your skin with CHG may help to lower your risk for infection. You may be given CHG to use for bathing. It may be in a bottle or in a prepackaged cloth to use on your skin. Carefully follow your health care provider's instructions and the instructions on the product label. Do not use CHG if you have a chlorhexidine allergy. Contact your health care provider if your skin gets irritated after scrubbing. This information is not intended to replace advice given to you by your health care provider. Make sure you discuss any questions you have with your health care provider. Document Revised: 05/14/2020 Document Reviewed: 05/14/2020 Elsevier Patient Education  2022 [employer]. Vaginal Hysterectomy, Care After The following information offers guidance on how to care for yourself after your procedure. Your health care provider may also give you more specific instructions. If you have problems or questions, contact your health care provider. What can I expect after the procedure? After the procedure, it is common to have: Pain in the lower abdomen and vagina. Vaginal bleeding and discharge for up to 1 week. You will need to use a sanitary pad after this procedure. Difficulty having a bowel movement (constipation). Temporary problems emptying the bladder. Tiredness (fatigue). Poor appetite. Less interest in sex. Feelings of sadness or other emotions. If your ovaries were also  removed, it is also common to have symptoms of menopause, such as hot flashes, night sweats, and lack of sleep (insomnia). Follow these instructions at home: Medicines  Take over-the-counter and prescription medicines only as told by your health care provider. Do not take aspirin or NSAIDs, such as ibuprofen. These medicines can cause bleeding. Ask your health care provider if the medicine prescribed to you: Requires you to avoid driving or  using machinery. Can cause constipation. You may need to take these actions to prevent or treat constipation: Drink enough fluid to keep your urine pale yellow. Take over-the-counter or prescription medicines. Eat foods that are high in fiber, such as beans, whole grains, and fresh fruits and vegetables. Limit foods that are high in fat and processed sugars, such as fried or sweet foods. Activity  Rest as told by your health care provider. Return to your normal activities as told by your health care provider. Ask your health care provider what activities are safe for you Avoid sitting for a long time without moving. Get up to take short walks every 1-2 hours. This is important to improve blood flow and breathing. Ask for help if you feel weak or unsteady. Try to have someone home with you for 1-2 weeks to help you with everyday chores. Do not lift anything that is heavier than 10 lb (4.5 kg), or the limit that you are told, until your health care provider says that it is safe. If you were given a sedative during the procedure, it can affect you for several hours. Do not drive or operate machinery until your health care provider says that it is safe. Lifestyle Do not use any products that contain nicotine or tobacco. These products include cigarettes, chewing tobacco, and vaping devices, such as e-cigarettes. These can delay healing after surgery. If you need help quitting, ask your health care provider. Do not drink alcohol until your health care provider  approves. General instructions Do not douche, use tampons, or have sex for at least 6 weeks, or as told by your health care provider. If you struggle with physical or emotional changes after your procedure, speak with your health care provider or a therapist. The stitches inside your vagina will dissolve over time and do not need to be taken out. Do not take baths, swim, or use a hot tub until your health care provider approves. You may only be allowed to take showers for 2-3 weeks Wear compression stockings as told by your health care provider. These stockings help to prevent blood clots and reduce swelling in your legs. Keep all follow-up visits. This is important. Contact a health care provider if: Your pain medicine is not helping. You have a fever. You have nausea or vomiting that does not go away. You feel dizzy. You have blood, pus, or a bad-smelling discharge from your vagina more than 1 week after the procedure. You continue to have trouble urinating 3-5 days after the procedure. Get help right away if: You have severe pain in your abdomen or back. You faint. You have heavy vaginal bleeding and blood clots, soaking through a sanitary pad in less than 1 hour. You have chest pain or shortness of breath. You have pain, swelling, or redness in your leg. These symptoms may represent a serious problem that is an emergency. Do not wait to see if the symptoms will go away. Get medical help right away. Call your local emergency services (911 in the [place] Do not drive yourself to the hospital. Summary After the procedure, it is common to have pain, vaginal bleeding, constipation, temporary problems emptying your bladder, and feelings of sadness or other emotions. Take over-the-counter and prescription medicines only as told by your health care provider. Rest as told by your health care provider. Return to your normal activities as told by your health care provider. Contact a health care  provider if your pain medicine is not helping, or  you have a fever, dizziness, or trouble urinating several days after the procedure. Get help right away if you have severe pain in your abdomen or back, or if you faint, have heavy bleeding, or have chest pain or shortness of breath. This information is not intended to replace advice given to you by your health care provider. Make sure you discuss any questions you have with your health care provider. Document Revised: 11/04/2019 Document Reviewed: 11/04/2019 Elsevier Patient Education  [place] Anesthesia, Adult, Care After This sheet gives you information about how to care for yourself after your procedure. Your health care provider may also give you more specific instructions. If you have problems or questions, contact your health care provider. What can I expect after the procedure? After the procedure, the following side effects are common: Pain or discomfort at the IV site. Nausea. Vomiting. Sore throat. Trouble concentrating. Feeling cold or chills. Feeling weak or tired. Sleepiness and fatigue. Soreness and body aches. These side effects can affect parts of the body that were not involved in surgery. Follow these instructions at home: For the time period you were told by your health care provider:  Rest. Do not participate in activities where you could fall or become injured. Do not drive or use machinery. Do not drink alcohol. Do not take sleeping pills or medicines that cause drowsiness. Do not make important decisions or sign legal documents. Do not take care of children on your own. Eating and drinking Follow any instructions from your health care provider about eating or drinking restrictions. When you feel hungry, start by eating small amounts of foods that are soft and easy to digest (bland), such as toast. Gradually return to your regular diet. Drink enough fluid to keep your urine pale yellow. If you  vomit, rehydrate by drinking water, juice, or clear broth. General instructions If you have sleep apnea, surgery and certain medicines can increase your risk for breathing problems. Follow instructions from your health care provider about wearing your sleep device: Anytime you are sleeping, including during daytime naps. While taking prescription pain medicines, sleeping medicines, or medicines that make you drowsy. Have a responsible adult stay with you for the time you are told. It is important to have someone help care for you until you are awake and alert. Return to your normal activities as told by your health care provider. Ask your health care provider what activities are safe for you. Take over-the-counter and prescription medicines only as told by your health care provider. If you smoke, do not smoke without supervision. Keep all follow-up visits as told by your health care provider. This is important. Contact a health care provider if: You have nausea or vomiting that does not get better with medicine. You cannot eat or drink without vomiting. You have pain that does not get better with medicine. You are unable to pass urine. You develop a skin rash. You have a fever. You have redness around your IV site that gets worse. Get help right away if: You have difficulty breathing. You have chest pain. You have blood in your urine or stool, or you vomit blood. Summary After the procedure, it is common to have a sore throat or nausea. It is also common to feel tired. Have a responsible adult stay with you for the time you are told. It is important to have someone help care for you until you are awake and alert. When you feel hungry, start by eating small amounts  of foods that are soft and easy to digest (bland), such as toast. Gradually return to your regular diet. Drink enough fluid to keep your urine pale yellow. Return to your normal activities as told by your health care provider.  Ask your health care provider what activities are safe for you. This information is not intended to replace advice given to you by your health care provider. Make sure you discuss any questions you have with your health care provider. Document Revised: 11/17/2019 Document Reviewed: 06/16/2019 Elsevier Patient Education  2022 [employer].

## 2021-01-18 ENCOUNTER — Other Ambulatory Visit (HOSPITAL_COMMUNITY)
Admission: RE | Admit: 2021-01-18 | Discharge: 2021-01-18 | Disposition: A | Discharge status: Home / Self Care | Payer: 59 | Source: Ambulatory Visit | Attending: Obstetrics & Gynecology | Admitting: Obstetrics & Gynecology

## 2021-01-18 ENCOUNTER — Other Ambulatory Visit: Payer: Self-pay

## 2021-01-18 ENCOUNTER — Encounter (HOSPITAL_COMMUNITY): Payer: Self-pay

## 2021-01-18 ENCOUNTER — Encounter (HOSPITAL_COMMUNITY)
Admission: RE | Admit: 2021-01-18 | Discharge: 2021-01-18 | Disposition: A | Discharge status: Home / Self Care | Payer: 59 | Source: Ambulatory Visit | Attending: Obstetrics & Gynecology | Admitting: Obstetrics & Gynecology

## 2021-01-18 VITALS — BP 100/62 | HR 94 | Temp 97.8°F | Resp 16 | Ht 65.0 in | Wt 209.0 lb

## 2021-01-18 DIAGNOSIS — Z20822 Contact with and (suspected) exposure to covid-19: Secondary | ICD-10-CM | POA: Insufficient documentation

## 2021-01-18 DIAGNOSIS — Z01818 Encounter for other preprocedural examination: Secondary | ICD-10-CM | POA: Diagnosis present

## 2021-01-18 DIAGNOSIS — N939 Abnormal uterine and vaginal bleeding, unspecified: Secondary | ICD-10-CM

## 2021-01-18 HISTORY — DX: Cardiac arrhythmia, unspecified: I49.9

## 2021-01-18 HISTORY — DX: Gastro-esophageal reflux disease without esophagitis: K21.9

## 2021-01-18 HISTORY — DX: Palpitations: R00.2

## 2021-01-18 LAB — CBC
HCT: 41.4 % (ref 36.0–46.0)
Hemoglobin: 13.9 g/dL (ref 12.0–15.0)
MCH: 31.8 pg (ref 26.0–34.0)
MCHC: 33.6 g/dL (ref 30.0–36.0)
MCV: 94.7 fL (ref 80.0–100.0)
Platelets: 278 10*3/uL (ref 150–400)
RBC: 4.37 MIL/uL (ref 3.87–5.11)
RDW: 13.2 % (ref 11.5–15.5)
WBC: 9.3 10*3/uL (ref 4.0–10.5)
nRBC: 0 % (ref 0.0–0.2)

## 2021-01-18 LAB — BASIC METABOLIC PANEL
Anion gap: 8 (ref 5–15)
BUN: 17 mg/dL (ref 6–20)
CO2: 23 mmol/L (ref 22–32)
Calcium: 8.8 mg/dL — ABNORMAL LOW (ref 8.9–10.3)
Chloride: 107 mmol/L (ref 98–111)
Creatinine, Ser: 0.68 mg/dL (ref 0.44–1.00)
GFR, Estimated: >60 mL/min (ref >60)
Glucose, Bld: 83 mg/dL (ref 70–99)
Potassium: 3.4 mmol/L — ABNORMAL LOW (ref 3.5–5.1)
Sodium: 138 mmol/L (ref 135–145)

## 2021-01-18 LAB — PREGNANCY, URINE: Preg Test, Ur: NEGATIVE

## 2021-01-18 LAB — TYPE AND SCREEN
ABO/RH(D): A POS
Antibody Screen: NEGATIVE

## 2021-01-18 LAB — SARS CORONAVIRUS 2 (TAT 6-24 HRS): SARS Coronavirus 2: NEGATIVE

## 2021-01-18 NOTE — H&P (Signed)
[facility] Attending History and Physical  Kendra Soto is a 45 y.o. [phone] presents for scheduled TVH with bilateral salpingectomy due to AUB.  In review, she has consistently reported difficulty with her periods.  Mostly heavy bleeding, using more than 6-7 pads per day.  She did note some improvement with the POPs; however, she has also noted a change in frequency.  Menses can be anywhere from q2-5wks.  Conservative options were reviewed and she desires to proceed with a more permanent option.  Denies any abnormal vaginal discharge, fevers, chills, sweats, dysuria, nausea, vomiting, other GI or GU symptoms or other general symptoms.  Work up including pelvic US and EMB which revealed no acute abnormalities.  See below [place] for full report.   Past Medical History:  Diagnosis Date   Allergies    Asthma    Dysrhythmia    GERD (gastroesophageal reflux disease)    History of kidney stones    Hypothyroid    Overactive bladder    Palpitations    Past Surgical History:  Procedure Laterality Date   CHOLECYSTECTOMY  2016   ESOPHAGOGASTRODUODENOSCOPY N/A 04/30/2018   normal. Empiric dilatation   MALONEY DILATION N/A 04/30/2018   Procedure: Venia Minks DILATION;  Surgeon: Daneil Dolin, MD;  Location: [facility];  Service: Endoscopy;  Laterality: N/A;   TUBAL LIGATION     WISDOM TOOTH EXTRACTION     OB History  Gravida Para Term Preterm AB Living  3 3 3     3   SAB IAB Ectopic Multiple Live Births               # Outcome Date GA Lbr Len/2nd Weight Sex Delivery Anes PTL Lv  3 Term           2 Term           1 Term           Patient denies any other pertinent gynecologic issues.  No current facility-administered medications on file prior to encounter.   Current Outpatient Medications on File Prior to Encounter  Medication Sig Dispense Refill   aspirin-acetaminophen-caffeine (EXCEDRIN MIGRAINE) 250-250-65 MG tablet Take 2 tablets by mouth every 6 (six)  hours as needed for headache.     clobetasol ointment (TEMOVATE) 3.08 % Apply 1 application topically 2 (two) times daily as needed (eczema).     fluticasone (FLONASE) 50 MCG/ACT nasal spray Place 2 sprays into both nostrils daily.     FLUTICASONE-EMOLLIENT EX Apply 1 application topically 2 (two) times daily as needed (rash). Mixed 1:1 with ketoconazole cream     ipratropium (ATROVENT) 0.03 % nasal spray Place 2 sprays into both nostrils 2 (two) times daily.     levocetirizine (XYZAL) 5 MG tablet Take 5 mg by mouth every evening.     levothyroxine (SYNTHROID, LEVOTHROID) 50 MCG tablet Take 50 mcg by mouth daily before breakfast.      meloxicam (MOBIC) 15 MG tablet Take 15 mg by mouth daily.     metoprolol succinate (TOPROL-XL) 25 MG 24 hr tablet Take 25 mg by mouth every morning.     montelukast (SINGULAIR) 10 MG tablet Take 10 mg by mouth at bedtime.     Multiple Vitamin (MULTIVITAMIN WITH MINERALS) TABS tablet Take 1 tablet by mouth every evening. Centrum Multivitamin For Women     norethindrone (MICRONOR) 0.35 MG tablet Take 1 tablet (0.35 mg total) by mouth daily. 90 tablet 3   omeprazole (PRILOSEC) 20 MG capsule  Take 20 mg by mouth daily.     oxybutynin (DITROPAN-XL) 10 MG 24 hr tablet Take 10 mg by mouth every evening.     PROAIR HFA 108 (90 Base) MCG/ACT inhaler Inhale 2 puffs into the lungs every 6 (six) hours as needed for wheezing or shortness of breath.     Ubrogepant (UBRELVY) 50 MG TABS Take 50 mg by mouth daily as needed (migraines). May repeat 50 mg dose in 2 hours as needed     No Known Allergies  Social History:   reports that she quit smoking about 13 years ago. Her smoking use included cigarettes. She has never used smokeless tobacco. She reports that she does not drink alcohol and does not use drugs. Family History  Problem Relation Age of Onset   Cancer Father        prostate   Hypertension Father    Hypertension Mother    Rheum arthritis Sister    Colon cancer Neg Hx     Celiac disease Neg Hx    Crohn's disease Neg Hx    Colon polyps Neg Hx     Review of Systems: Pertinent items noted in HPI and remainder of comprehensive ROS otherwise negative.  PHYSICAL EXAM: Completed in office  Last menstrual period 01/04/2021. CONSTITUTIONAL: Well-developed, well-nourished female in no acute distress.  SKIN: Skin is warm and dry. No rash noted. Not diaphoretic. No erythema. No pallor. NEUROLOGIC: Alert and oriented to person, place, and time. Normal reflexes, muscle tone coordination. No cranial nerve deficit noted. PSYCHIATRIC: Normal mood and affect. Normal behavior. Normal judgment and thought content. CARDIOVASCULAR: Normal heart rate noted, regular rhythm RESPIRATORY: Effort and breath sounds normal, no problems with respiration noted ABDOMEN: Soft, nontender, nondistended. PELVIC: deferred MUSCULOSKELETAL: no calf tenderness bilaterally EXT: no edema bilaterally, normal pulses  Labs: Results for orders placed or performed during the hospital encounter of 01/18/21 (from the past 336 hour(s))  CBC   Collection Time: 01/18/21 10:38 AM  Result Value Ref Range   WBC 9.3 4.0 - 10.5 K/uL   RBC 4.37 3.87 - 5.11 MIL/uL   Hemoglobin 13.9 12.0 - 15.0 g/dL   HCT 41.4 36.0 - 46.0 %   MCV 94.7 80.0 - 100.0 fL   MCH 31.8 26.0 - 34.0 pg   MCHC 33.6 30.0 - 36.0 g/dL   RDW 13.2 11.5 - 15.5 %   Platelets 278 150 - 400 K/uL   nRBC 0.0 0.0 - 0.2 %    Imaging Studies: [place]- (05/28/2020)- 8.8cm uterus with 2.4cm left subserosal fibroid noted, no other abnormalities. Normal ovaries bilaterally  Assessment: Abnormal uterine bleed   Plan: -Plan to proceed with total vaginal hysterectomy and bilateral salpingectomy -NPO -LR @ 125cc/hr -SCDs to OR -Ancef 2g IV  -Toradol for ERAS -Risk/benefits and alternatives reviewed with the patient including but not limited to risk of bleeding, infection and injury to surrounding organs.  Questions and concerns were addressed  and pt desires to proceed  Janyth Pupa, DO Attending [place], [facility] for [facility], [place]

## 2021-01-22 ENCOUNTER — Encounter (HOSPITAL_COMMUNITY)
Admission: RE | Disposition: A | Discharge status: Home / Self Care | Payer: Self-pay | Source: Home / Self Care | Attending: Obstetrics & Gynecology

## 2021-01-22 ENCOUNTER — Ambulatory Visit (HOSPITAL_COMMUNITY)
Admission: RE | Admit: 2021-01-22 | Discharge: 2021-01-22 | Disposition: A | Discharge status: Home / Self Care | Payer: 59 | Attending: Obstetrics & Gynecology | Admitting: Obstetrics & Gynecology

## 2021-01-22 ENCOUNTER — Ambulatory Visit (HOSPITAL_COMMUNITY): Payer: 59 | Admitting: Anesthesiology

## 2021-01-22 ENCOUNTER — Encounter (HOSPITAL_COMMUNITY): Payer: Self-pay | Admitting: Obstetrics & Gynecology

## 2021-01-22 DIAGNOSIS — I10 Essential (primary) hypertension: Secondary | ICD-10-CM | POA: Diagnosis not present

## 2021-01-22 DIAGNOSIS — M069 Rheumatoid arthritis, unspecified: Secondary | ICD-10-CM | POA: Diagnosis not present

## 2021-01-22 DIAGNOSIS — J45909 Unspecified asthma, uncomplicated: Secondary | ICD-10-CM | POA: Diagnosis not present

## 2021-01-22 DIAGNOSIS — N8003 Adenomyosis of the uterus: Secondary | ICD-10-CM | POA: Insufficient documentation

## 2021-01-22 DIAGNOSIS — K219 Gastro-esophageal reflux disease without esophagitis: Secondary | ICD-10-CM | POA: Insufficient documentation

## 2021-01-22 DIAGNOSIS — E039 Hypothyroidism, unspecified: Secondary | ICD-10-CM | POA: Diagnosis not present

## 2021-01-22 DIAGNOSIS — Z79899 Other long term (current) drug therapy: Secondary | ICD-10-CM | POA: Diagnosis not present

## 2021-01-22 DIAGNOSIS — D252 Subserosal leiomyoma of uterus: Secondary | ICD-10-CM | POA: Diagnosis not present

## 2021-01-22 DIAGNOSIS — Z87891 Personal history of nicotine dependence: Secondary | ICD-10-CM | POA: Insufficient documentation

## 2021-01-22 DIAGNOSIS — N939 Abnormal uterine and vaginal bleeding, unspecified: Secondary | ICD-10-CM | POA: Diagnosis present

## 2021-01-22 HISTORY — PX: VAGINAL HYSTERECTOMY: SHX2639

## 2021-01-22 SURGERY — HYSTERECTOMY, VAGINAL
Anesthesia: General | Site: Vagina

## 2021-01-22 MED ORDER — KETOROLAC TROMETHAMINE 30 MG/ML IJ SOLN
INTRAMUSCULAR | Status: AC
  Filled 2021-01-22: qty 1

## 2021-01-22 MED ORDER — HYDROMORPHONE HCL 1 MG/ML IJ SOLN
0.5000 mg | INTRAMUSCULAR | Status: DC | PRN
  Administered 2021-01-22: 0.5 mg via INTRAVENOUS

## 2021-01-22 MED ORDER — ONDANSETRON HCL 4 MG/2ML IJ SOLN
INTRAMUSCULAR | Status: AC
  Filled 2021-01-22: qty 2

## 2021-01-22 MED ORDER — HYDROMORPHONE HCL 1 MG/ML IJ SOLN
INTRAMUSCULAR | Status: AC
  Filled 2021-01-22: qty 1

## 2021-01-22 MED ORDER — CHLORHEXIDINE GLUCONATE 0.12 % MT SOLN
OROMUCOSAL | Status: AC
  Administered 2021-01-22: 15 mL
  Filled 2021-01-22: qty 15

## 2021-01-22 MED ORDER — IBUPROFEN 600 MG PO TABS: 600.0000 mg | ORAL_TABLET | Freq: Four times a day (QID) | ORAL | 0 refills | Status: DC | PRN

## 2021-01-22 MED ORDER — STERILE WATER FOR IRRIGATION IR SOLN
Status: DC | PRN
  Administered 2021-01-22: 500 mL

## 2021-01-22 MED ORDER — PHENYLEPHRINE HCL-NACL 20-0.9 MG/250ML-% IV SOLN
INTRAVENOUS | Status: AC
  Filled 2021-01-22: qty 250

## 2021-01-22 MED ORDER — ORAL CARE MOUTH RINSE: 15.0000 mL | Freq: Once | OROMUCOSAL | Status: DC

## 2021-01-22 MED ORDER — METOCLOPRAMIDE HCL 5 MG/ML IJ SOLN
10.0000 mg | Freq: Once | INTRAMUSCULAR | Status: AC
  Administered 2021-01-22: 10 mg via INTRAVENOUS
  Filled 2021-01-22: qty 2

## 2021-01-22 MED ORDER — KETOROLAC TROMETHAMINE 15 MG/ML IJ SOLN: 15.0000 mg | INTRAMUSCULAR | Status: DC

## 2021-01-22 MED ORDER — DEXAMETHASONE SODIUM PHOSPHATE 10 MG/ML IJ SOLN
INTRAMUSCULAR | Status: AC
  Filled 2021-01-22: qty 1

## 2021-01-22 MED ORDER — ACETAMINOPHEN 10 MG/ML IV SOLN
1000.0000 mg | Freq: Four times a day (QID) | INTRAVENOUS | Status: DC
  Administered 2021-01-22: 1000 mg via INTRAVENOUS
  Filled 2021-01-22: qty 100

## 2021-01-22 MED ORDER — ONDANSETRON HCL 4 MG PO TABS: 4.0000 mg | ORAL_TABLET | Freq: Three times a day (TID) | ORAL | 0 refills | Status: DC | PRN

## 2021-01-22 MED ORDER — PROPOFOL 10 MG/ML IV BOLUS
INTRAVENOUS | Status: AC
  Filled 2021-01-22: qty 20

## 2021-01-22 MED ORDER — POVIDONE-IODINE 10 % EX SWAB: 2.0000 "application " | Freq: Once | CUTANEOUS | Status: DC

## 2021-01-22 MED ORDER — KETOROLAC TROMETHAMINE 30 MG/ML IJ SOLN
INTRAMUSCULAR | Status: DC | PRN
  Administered 2021-01-22: 30 mg via INTRAVENOUS

## 2021-01-22 MED ORDER — ROCURONIUM BROMIDE 10 MG/ML (PF) SYRINGE
PREFILLED_SYRINGE | INTRAVENOUS | Status: AC
  Filled 2021-01-22: qty 10

## 2021-01-22 MED ORDER — PHENYLEPHRINE 40 MCG/ML (10ML) SYRINGE FOR IV PUSH (FOR BLOOD PRESSURE SUPPORT)
PREFILLED_SYRINGE | INTRAVENOUS | Status: AC
  Filled 2021-01-22: qty 10

## 2021-01-22 MED ORDER — CEFAZOLIN SODIUM-DEXTROSE 2-4 GM/100ML-% IV SOLN
INTRAVENOUS | Status: AC
  Filled 2021-01-22: qty 100

## 2021-01-22 MED ORDER — LIDOCAINE HCL (CARDIAC) PF 100 MG/5ML IV SOSY
PREFILLED_SYRINGE | INTRAVENOUS | Status: DC | PRN
  Administered 2021-01-22: 100 mg via INTRAVENOUS

## 2021-01-22 MED ORDER — OXYCODONE HCL 5 MG PO CAPS: 5.0000 mg | ORAL_CAPSULE | Freq: Four times a day (QID) | ORAL | 0 refills | Status: AC | PRN

## 2021-01-22 MED ORDER — SCOPOLAMINE 1 MG/3DAYS TD PT72: 1.0000 | MEDICATED_PATCH | Freq: Once | TRANSDERMAL | Status: DC

## 2021-01-22 MED ORDER — PROPOFOL 10 MG/ML IV BOLUS
INTRAVENOUS | Status: DC | PRN
  Administered 2021-01-22: 180 mg via INTRAVENOUS

## 2021-01-22 MED ORDER — DEXAMETHASONE SODIUM PHOSPHATE 10 MG/ML IJ SOLN
INTRAMUSCULAR | Status: DC | PRN
  Administered 2021-01-22: 10 mg via INTRAVENOUS

## 2021-01-22 MED ORDER — 0.9 % SODIUM CHLORIDE (POUR BTL) OPTIME
TOPICAL | Status: DC | PRN
  Administered 2021-01-22: 1000 mL

## 2021-01-22 MED ORDER — SUGAMMADEX SODIUM 500 MG/5ML IV SOLN
INTRAVENOUS | Status: DC | PRN
  Administered 2021-01-22: 200 mg via INTRAVENOUS

## 2021-01-22 MED ORDER — ONDANSETRON HCL 4 MG/2ML IJ SOLN: 4.0000 mg | Freq: Once | INTRAMUSCULAR | Status: DC | PRN

## 2021-01-22 MED ORDER — GABAPENTIN 300 MG PO CAPS: 300.0000 mg | ORAL_CAPSULE | Freq: Three times a day (TID) | ORAL | 0 refills | Status: DC

## 2021-01-22 MED ORDER — ACETAMINOPHEN 325 MG PO TABS: 650.0000 mg | ORAL_TABLET | Freq: Four times a day (QID) | ORAL | 0 refills | Status: AC | PRN

## 2021-01-22 MED ORDER — BUPIVACAINE-EPINEPHRINE (PF) 0.25% -1:200000 IJ SOLN
INTRAMUSCULAR | Status: AC
  Filled 2021-01-22: qty 30

## 2021-01-22 MED ORDER — MIDAZOLAM HCL 5 MG/5ML IJ SOLN
INTRAMUSCULAR | Status: DC | PRN
  Administered 2021-01-22: 2 mg via INTRAVENOUS

## 2021-01-22 MED ORDER — ROCURONIUM BROMIDE 100 MG/10ML IV SOLN
INTRAVENOUS | Status: DC | PRN
  Administered 2021-01-22: 60 mg via INTRAVENOUS

## 2021-01-22 MED ORDER — KETOROLAC TROMETHAMINE 30 MG/ML IJ SOLN
INTRAMUSCULAR | Status: AC
  Administered 2021-01-22: 15 mg via INTRAVENOUS
  Filled 2021-01-22: qty 1

## 2021-01-22 MED ORDER — FENTANYL CITRATE (PF) 100 MCG/2ML IJ SOLN
INTRAMUSCULAR | Status: DC | PRN
  Administered 2021-01-22: 50 ug via INTRAVENOUS
  Administered 2021-01-22 (×2): 100 ug via INTRAVENOUS

## 2021-01-22 MED ORDER — LACTATED RINGERS IV SOLN: INTRAVENOUS | Status: DC

## 2021-01-22 MED ORDER — SODIUM CHLORIDE 0.9 % IR SOLN
Status: DC | PRN
  Administered 2021-01-22: 1000 mL

## 2021-01-22 MED ORDER — KETOROLAC TROMETHAMINE 10 MG PO TABS: 10.0000 mg | ORAL_TABLET | Freq: Four times a day (QID) | ORAL | 0 refills | Status: AC

## 2021-01-22 MED ORDER — MIDAZOLAM HCL 2 MG/2ML IJ SOLN
INTRAMUSCULAR | Status: AC
  Filled 2021-01-22: qty 2

## 2021-01-22 MED ORDER — FENTANYL CITRATE (PF) 250 MCG/5ML IJ SOLN
INTRAMUSCULAR | Status: AC
  Filled 2021-01-22: qty 5

## 2021-01-22 MED ORDER — BUPIVACAINE-EPINEPHRINE 0.25% -1:200000 IJ SOLN
INTRAMUSCULAR | Status: DC | PRN
  Administered 2021-01-22: 30 mL

## 2021-01-22 MED ORDER — SCOPOLAMINE 1 MG/3DAYS TD PT72
MEDICATED_PATCH | TRANSDERMAL | Status: AC
  Administered 2021-01-22: 1.5 mg
  Filled 2021-01-22: qty 1

## 2021-01-22 MED ORDER — MEPERIDINE HCL 50 MG/ML IJ SOLN: 6.2500 mg | INTRAMUSCULAR | Status: DC | PRN

## 2021-01-22 MED ORDER — LACTATED RINGERS IV SOLN
INTRAVENOUS | Status: DC
  Administered 2021-01-22: 1000 mL via INTRAVENOUS

## 2021-01-22 MED ORDER — PHENYLEPHRINE HCL (PRESSORS) 10 MG/ML IV SOLN
INTRAVENOUS | Status: DC | PRN
  Administered 2021-01-22: 100 ug via INTRAVENOUS
  Administered 2021-01-22: 80 ug via INTRAVENOUS
  Administered 2021-01-22: 40 ug via INTRAVENOUS
  Administered 2021-01-22: 80 ug via INTRAVENOUS

## 2021-01-22 MED ORDER — CHLORHEXIDINE GLUCONATE 0.12 % MT SOLN: 15.0000 mL | Freq: Once | OROMUCOSAL | Status: DC

## 2021-01-22 MED ORDER — CEFAZOLIN SODIUM-DEXTROSE 2-4 GM/100ML-% IV SOLN
2.0000 g | INTRAVENOUS | Status: AC
  Administered 2021-01-22: 2 g via INTRAVENOUS

## 2021-01-22 SURGICAL SUPPLY — 35 items
DRAPE HALF SHEET 40X57 (DRAPES) ×3 IMPLANT
DRAPE STERI URO 9X17 APER PCH (DRAPES) ×3 IMPLANT
GAUZE 4X4 16PLY ~~LOC~~+RFID DBL (SPONGE) ×6 IMPLANT
GAUZE PACKING IODOFORM 2 (PACKING) IMPLANT
GLOVE SRG 8 PF TXTR STRL LF DI (GLOVE) ×1 IMPLANT
GLOVE SURG ENC MOIS LTX SZ6.5 (GLOVE) ×3 IMPLANT
GLOVE SURG LTX SZ6.5 (GLOVE) ×9 IMPLANT
GLOVE SURG UNDER POLY LF SZ7 (GLOVE) ×15 IMPLANT
GLOVE SURG UNDER POLY LF SZ8 (GLOVE) ×2
GOWN STRL REUS W/ TWL LRG LVL3 (GOWN DISPOSABLE) ×1 IMPLANT
GOWN STRL REUS W/TWL LRG LVL3 (GOWN DISPOSABLE) ×8 IMPLANT
GOWN STRL REUS W/TWL XL LVL3 (GOWN DISPOSABLE) ×3 IMPLANT
IV NS 1000ML (IV SOLUTION) ×2
IV NS 1000ML BAXH (IV SOLUTION) ×1 IMPLANT
KIT BLADEGUARD II DBL (SET/KITS/TRAYS/PACK) ×3 IMPLANT
KIT TURNOVER CYSTO (KITS) ×3 IMPLANT
NEEDLE HYPO 18GX1.5 BLUNT FILL (NEEDLE) ×3 IMPLANT
NEEDLE HYPO 25X1 1.5 SAFETY (NEEDLE) ×3 IMPLANT
NS IRRIG 1000ML POUR BTL (IV SOLUTION) ×3 IMPLANT
PACK PERI GYN (CUSTOM PROCEDURE TRAY) ×3 IMPLANT
PAD ARMBOARD 7.5X6 YLW CONV (MISCELLANEOUS) ×6 IMPLANT
SET BASIN LINEN APH (SET/KITS/TRAYS/PACK) ×3 IMPLANT
SPONGE T-LAP 18X18 ~~LOC~~+RFID (SPONGE) IMPLANT
SPONGE T-LAP 4X18 ~~LOC~~+RFID (SPONGE) ×3 IMPLANT
SUT VIC AB 0 CT1 27 (SUTURE) ×8
SUT VIC AB 0 CT1 27XBRD ANTBC (SUTURE) ×1 IMPLANT
SUT VIC AB 0 CT1 27XCR 8 STRN (SUTURE) ×3 IMPLANT
SUT VIC AB 0 CT1 36 (SUTURE) ×6 IMPLANT
SYR 30ML LL (SYRINGE) ×3 IMPLANT
SYR BULB IRRIG 60ML STRL (SYRINGE) ×3 IMPLANT
SYR CONTROL 10ML LL (SYRINGE) ×3 IMPLANT
TOWEL OR 17X24 6PK STRL BLUE (TOWEL DISPOSABLE) ×3 IMPLANT
TRAY FOLEY W/BAG SLVR 14FR (SET/KITS/TRAYS/PACK) ×3 IMPLANT
VERSALIGHT (MISCELLANEOUS) ×3 IMPLANT
WATER STERILE IRR 500ML POUR (IV SOLUTION) ×3 IMPLANT

## 2021-01-22 NOTE — Anesthesia Preprocedure Evaluation (Signed)
Anesthesia Evaluation  Patient identified by MRN, date of birth, ID band Patient awake    Reviewed: Allergy & Precautions, NPO status , Patient's Chart, lab work & pertinent test results, reviewed documented beta blocker date and time   Airway Mallampati: II  TM Distance: >3 FB Neck ROM: Full    Dental  (+) Dental Advisory Given, Teeth Intact   Pulmonary asthma , neg COPD,  COPD inhaler, former smoker,    Pulmonary exam normal breath sounds clear to auscultation       Cardiovascular (-) hypertensionPt. on home beta blockers Normal cardiovascular exam+ dysrhythmias (palpitations, PVCs)  Rhythm:Regular Rate:Normal  Vent. rate 89 BPM PR interval 126 ms QRS duration 70 ms QT/QTcB 352/428 ms P-R-T axes 67 21 58 Sinus rhythm with frequent Premature ventricular complexes Otherwise normal ECG No old tracing to compare Confirmed by Shelva Majestic [phone]) on 01/18/2021 5:13:29 PM  FINDINGS:  Perfusion: No decreased activity in the left ventricle on stress imaging to suggest reversible ischemia or infarction. Apical thinning identified on the rest and stress images.  Wall Motion: Septal hypokinesis.   Left Ventricular Ejection Fraction: 45 %   End diastolic volume 80 ml   End systolic volume 44 ml   IMPRESSION:  1. No reversible ischemia or infarction.   2. Septal hypokinesis..   3. Left ventricular ejection fraction 45%   4. Non invasive risk stratification*: Intermediate   *2012 Appropriate Use Criteria for Coronary Revascularization  Focused Update: J Am Coll Cardiol. 2012;59(9):857-881.  Electronically Signed   By: Kerby Moors M.D.   On: 10/28/2019 12:57 Exam End: 10/28/19 11:40     Neuro/Psych negative neurological ROS  negative psych ROS   GI/Hepatic Neg liver ROS, GERD  Medicated,  Endo/Other  Hypothyroidism   Renal/GU negative Renal ROS     Musculoskeletal  (+) Arthritis , Rheumatoid disorders,     Abdominal   Peds  Hematology negative hematology ROS (+)   Anesthesia Other Findings   Reproductive/Obstetrics                            Anesthesia Physical Anesthesia Plan  ASA: 2  Anesthesia Plan: General   Post-op Pain Management:    Induction: Intravenous  PONV Risk Score and Plan: Ondansetron, Dexamethasone and Midazolam  Airway Management Planned: Oral ETT  Additional Equipment:   Intra-op Plan:   Post-operative Plan: Extubation in OR  Informed Consent: I have reviewed the patients History and Physical, chart, labs and discussed the procedure including the risks, benefits and alternatives for the proposed anesthesia with the patient or authorized representative who has indicated his/her understanding and acceptance.     Dental advisory given  Plan Discussed with: CRNA and Surgeon  Anesthesia Plan Comments:         Anesthesia Quick Evaluation

## 2021-01-22 NOTE — Transfer of Care (Signed)
Immediate Anesthesia Transfer of Care Note  Patient: Kendra Soto  Procedure(s) Performed: HYSTERECTOMY VAGINAL (Vagina )  Patient Location: PACU  Anesthesia Type:General  Level of Consciousness: awake, alert , oriented and patient cooperative  Airway & Oxygen Therapy: Patient Spontanous Breathing and Patient connected to nasal cannula oxygen  Post-op Assessment: Report given to RN, Post -op Vital signs reviewed and stable and Patient moving all extremities X 4  Post vital signs: Reviewed and stable  Last Vitals:  Vitals Value Taken Time  BP 125/80 01/22/21 0925  Temp    Pulse 77 01/22/21 0927  Resp 16 01/22/21 0927  SpO2 96 % 01/22/21 0927  Vitals shown include unvalidated device data.  Last Pain:  Vitals:   01/22/21 0706  TempSrc: Oral  PainSc: 4       Patients Stated Pain Goal: 8 (19/47/12 5271)  Complications: No notable events documented.

## 2021-01-22 NOTE — Op Note (Signed)
OPERATIVE REPORT  PRE-OP DIAGNOSIS:Abnormal Uterine Bleeding   POST-OP DIAGNOSIS;Abnormal Uterine Bleeding   PROCEDURE:Procedure(s) (LRB): HYSTERECTOMY VAGINAL (N/A)   SURGEON:Dusten Ellinwood M Charlie Char  ASSIST:Dr. [place] with cervix  EBL:50 mL IVF: 1200cc UOP: 277OE  COMPLICATIONS: none   PATIENT TO:  PACU - hemodynamically stable.  PROCEDURE DETAILS: The patient was taken to the operating room after appropriate identification and placed on the operating table.  After the attainment of adequate general anesthesia the patient was placed in the lithotomy position. The perineum and vagina were prepped and draped in the usual fashion.  A Foley catheter was inserted into the bladder and connected to straight drainage. A weighted speculum was placed in the posterior vagina.  Single tooth tenaculums were placed on the anterior and posterior lip of the cervix respectively. The cervicovaginal mucosa was injected with a 30cc of quarter percent marcaine with epinephrine and the cervix was then circumferentially incised with the bovie and the bladder was dissected off the pubovesical cervical fascia. The posterior cu-lde-sac was entered sharply without difficulty. A heany clamp was placed over the uterosacral ligaments bilaterally., These were transected and suture ligated with 0 vicryl. The cardinal ligaments were then clamped bilaterally and transected and ligated.  The anterior cul-de-sac was then entered without difficulty. The uterine arteries and the broad ligament were then serially clamped and ligated bilaterally using the ligasure. Excellent hemostasis was visualized. Both cornu were clamped with heany clamps, transected and the uterus was delivered. Theses pedicles were then suture ligated with excellent hemostasis.    Attention was turned to the adenxa.  While the ovaries were visualized, only a portion of the fallopian tube was seen and not able to be grasped or mobilized without  difficulty.  The fallopian tubes were left in-situe.  The pelvis was irrigated.  Excellent hemostasis was obtained.The vaginal cuff was closed with 0 vicryl in a running locked fashion incorporating the peritoneum. All instruments were removed from the vagina.  The patient tolerated the procedure without difficulty and was taken to the recovery room in stable condition.  An experienced assistant was required given the standard of surgical care given the complexity of the case.  This assistant was needed for exposure, dissection, suctioning, retraction, instrument exchange.  Janyth Pupa, DO Attending [place], [facility] for [employer], [place]

## 2021-01-22 NOTE — Anesthesia Procedure Notes (Signed)
Procedure Name: Intubation Date/Time: 01/22/2021 7:41 AM Performed by: Jonna Munro, CRNA Pre-anesthesia Checklist: Patient identified, Emergency Drugs available, Suction available, Patient being monitored and Timeout performed Patient Re-evaluated:Patient Re-evaluated prior to induction Oxygen Delivery Method: Circle system utilized Preoxygenation: Pre-oxygenation with 100% oxygen Induction Type: IV induction Ventilation: Mask ventilation without difficulty Laryngoscope Size: Mac and 3 Grade View: Grade I Tube type: Oral Tube size: 7.0 mm Number of attempts: 1 Airway Equipment and Method: Stylet Placement Confirmation: ETT inserted through vocal cords under direct vision, positive ETCO2 and breath sounds checked- equal and bilateral Secured at: 22 cm Tube secured with: Tape Dental Injury: Teeth and Oropharynx as per pre-operative assessment

## 2021-01-22 NOTE — Interval H&P Note (Signed)
History and Physical Interval Note:  01/22/2021 7:14 AM  Kendra Soto  has presented today for surgery, with the diagnosis of Abnormal Uterine Bleeding.  The various methods of treatment have been discussed with the patient and family. After consideration of risks, benefits and other options for treatment, the patient has consented to  Procedure(s): HYSTERECTOMY VAGINAL WITH BILATERAL SALPINGECTOMY (N/A) as a surgical intervention.  The patient's history has been reviewed, patient examined, no change in status, stable for surgery.  I have reviewed the patient's chart and labs.  Questions were answered to the patient's satisfaction.     [name]

## 2021-01-22 NOTE — Discharge Instructions (Addendum)
Post Operative Pain Med Plan:  >Take gabapentin 300 mg three times per day, as prescribed for 4 days, try to space them evenly  >Take the oxycodone- 1 tablet, on a schedule, around the clock, every 6 hours(set your phone alarm) for the first 2 days, there may be times when you will need 2 tablets but taking them on a schedule will decrease this need  >You can also take Tylenol together with the oxycodone.  If the oxycodone seems "to strong" then just take Tylenol  >Oxycodone will cause constipation, please be sure to take a stool softener (Colace) twice daily while taking this pain medication and/or continue this medication until your bowel regimen returns to normal  >Take the Toradol every 8 hours for the first 3 days then the remainder to supplement the pain as needed  >After the toradol is gone switch to Ibuprofen 600mg  every 6 hours as needed  If possible try to take the Toradol or Ibuprofen with food to help avoid upsetting your stomach  >Use a heating pad as well as needed  >I have also sent a prescription for zofran (ondansetron) for nausea to take if needed over the first couple of days  >Be gentle with your diet the first few days, liquids and soft non spicy food, fruits are great  >Get up and move, no lifting or straining   HOME INSTRUCTIONS  Please note any unusual or excessive bleeding, pain, swelling. Mild dizziness or drowsiness are normal for about 24 hours after surgery.   Shower when comfortable  Restrictions: No driving for 24 hours or while taking pain medications.  Activity:  No heavy lifting (> 10 lbs), nothing in vagina (no tampons, douching, or intercourse) x 4 weeks; no tub baths for 4 weeks Vaginal spotting is expected but if your bleeding is heavy, period like,  please call the office   Diet:  You may return to your regular diet.  Do not eat large meals.  Eat small frequent meals throughout the day.  Continue to drink a good amount of water at least 6-8  glasses of water per day, hydration is very important for the healing process.  Pain Management: see above  Always take prescription pain medication with food, it may cause constipation, increase fluids and fiber and you may want to take an over-the-counter stool softener like Colace as needed up to 2x a day.    Alcohol -- Avoid for 24 hours and while taking pain medications.  Nausea: Take sips of ginger ale or soda.  A prescription for zofran has also been sent in to take if needed.  Fever -- Call physician if temperature over 101 degrees  Follow up:  Please follow up at your scheduled post-op visit.  If you experience fever (a temperature greater than 100.4), pain unrelieved by pain medication, shortness of breath, swelling of a single leg, or any other symptoms which are concerning to you please the office immediately.

## 2021-01-22 NOTE — Anesthesia Postprocedure Evaluation (Signed)
Anesthesia Post Note  Patient: Falicia Lizotte  Procedure(s) Performed: HYSTERECTOMY VAGINAL (Vagina )  Patient location during evaluation: Phase II Anesthesia Type: General Level of consciousness: awake and alert and oriented Pain management: pain level controlled Vital Signs Assessment: post-procedure vital signs reviewed and stable Respiratory status: spontaneous breathing, nonlabored ventilation and respiratory function stable Cardiovascular status: blood pressure returned to baseline and stable Postop Assessment: no apparent nausea or vomiting Anesthetic complications: no   No notable events documented.   Last Vitals:  Vitals:   01/22/21 1045 01/22/21 1057  BP: 102/71 112/75  Pulse: (!) 55 60  Resp: 14 14  Temp:  36.6 C  SpO2: 98% 99%    Last Pain:  Vitals:   01/22/21 1057  TempSrc: Oral  PainSc: 2                  Idania Desouza C Shan Valdes

## 2021-01-23 ENCOUNTER — Encounter (HOSPITAL_COMMUNITY): Payer: Self-pay | Admitting: Obstetrics & Gynecology

## 2021-01-23 LAB — SURGICAL PATHOLOGY

## 2021-02-01 ENCOUNTER — Encounter: Payer: Self-pay | Admitting: Obstetrics & Gynecology

## 2021-02-01 ENCOUNTER — Ambulatory Visit (INDEPENDENT_AMBULATORY_CARE_PROVIDER_SITE_OTHER): Payer: 59 | Admitting: Obstetrics & Gynecology

## 2021-02-01 ENCOUNTER — Other Ambulatory Visit: Payer: Self-pay

## 2021-02-01 VITALS — BP 125/74 | HR 102 | Ht 65.0 in | Wt 205.8 lb

## 2021-02-01 DIAGNOSIS — Z4889 Encounter for other specified surgical aftercare: Secondary | ICD-10-CM

## 2021-02-01 NOTE — Progress Notes (Signed)
    [place] OP Visit Note  Kendra Soto is a 45 y.o. G31P3003 female who presents for a postoperative visit. She is 1 week postop following TVH completed on 01/22/2021  Today she notes that she is doing great.  Pain well controlled with medication, no longer taking scheduled. Denies fever or chills.  Tolerating gen diet.  +Flatus, Regular BMs.  Notes some light pink spotting.  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    Objective:  LMP 01/22/2021 Comment: Pregnancy test negative on 01/18/2021   Physical Examination:  GENERAL ASSESSMENT: well developed and well nourished SKIN: warm and dry CHEST: normal air exchange, respiratory effort normal with no retractions HEART: regular rate and rhythm ABDOMEN: soft, non-distended,  EXTREMITY: no edema, no calf tenderness PSYCH: mood appropriate, normal affect       Assessment:    S/p TVH   Plan:   Meeting milestones appropriately Reviewed pelvic rest and decreased activity for several more weeks F/u in 4wks  Janyth Pupa, DO Attending [place], [place] for [employer], [place]

## 2021-03-01 ENCOUNTER — Other Ambulatory Visit: Payer: Self-pay

## 2021-03-01 ENCOUNTER — Ambulatory Visit (INDEPENDENT_AMBULATORY_CARE_PROVIDER_SITE_OTHER): Payer: 59 | Admitting: Obstetrics & Gynecology

## 2021-03-01 ENCOUNTER — Encounter: Payer: Self-pay | Admitting: Obstetrics & Gynecology

## 2021-03-01 VITALS — BP 92/67 | HR 80 | Ht 66.0 in | Wt 207.2 lb

## 2021-03-01 DIAGNOSIS — Z4889 Encounter for other specified surgical aftercare: Secondary | ICD-10-CM

## 2021-03-01 NOTE — Progress Notes (Signed)
° ° °  [place] Op Visit Note  Kendra Soto is a 45 y.o. G55P3003 female who presents for a postoperative visit. She is 6 weeks postop following a TVH, completed on 01/22/2021.  Today she notes that she is feeling good. Denies fever or chills.  Tolerating gen diet.  +Flatus, Regular BMs.  Pain is minimal, not taking any medication.  Overall doing well and reports no acute complaints  Review of Systems Pertinent items are noted in HPI.    Objective:  BP 92/67 (BP Location: Right Arm, Patient Position: Sitting, Cuff Size: Large)    Pulse 80    Ht 5\' 6"  (1.676 m)    Wt 207 lb 3.2 oz (94 kg)    LMP 01/22/2021 Comment: Pregnancy test negative on 01/18/2021   BMI 33.44 kg/m    Physical Examination:  GENERAL ASSESSMENT: well developed and well nourished SKIN: warm and dry CHEST: normal air exchange, respiratory effort normal with no retractions HEART: regular rate and rhythm ABDOMEN: soft, non-distended, no rebound, no guarding, non-tender GU: Vulva:  normal appearing vulva with no masses, tenderness or lesions Vagina:  normal mucosa, no discharge, vaginal cuff healing- sutures still visible, cuff closed on bimanual exam- non-tender EXTREMITY: no edema, no calf tenderness PSYCH: mood appropriate, normal affect       Assessment:    Postop TVH- 11/8   Plan:   -meeting milestones appropriately -advised pelvic rest for one more week -may resume regular activity otherwise -f/u for annual every 44yrs or as needed  Janyth Pupa, DO Attending [place], [place] for [place]

## 2021-04-02 DIAGNOSIS — R059 Cough, unspecified: Secondary | ICD-10-CM | POA: Diagnosis not present

## 2021-04-02 DIAGNOSIS — M545 Low back pain, unspecified: Secondary | ICD-10-CM | POA: Diagnosis not present

## 2021-04-02 DIAGNOSIS — K219 Gastro-esophageal reflux disease without esophagitis: Secondary | ICD-10-CM | POA: Diagnosis not present

## 2021-04-02 DIAGNOSIS — R0981 Nasal congestion: Secondary | ICD-10-CM | POA: Diagnosis not present

## 2021-04-02 DIAGNOSIS — J029 Acute pharyngitis, unspecified: Secondary | ICD-10-CM | POA: Diagnosis not present

## 2021-04-20 DIAGNOSIS — J45901 Unspecified asthma with (acute) exacerbation: Secondary | ICD-10-CM | POA: Diagnosis not present

## 2021-04-20 DIAGNOSIS — Z20828 Contact with and (suspected) exposure to other viral communicable diseases: Secondary | ICD-10-CM | POA: Diagnosis not present

## 2021-04-20 DIAGNOSIS — R059 Cough, unspecified: Secondary | ICD-10-CM | POA: Diagnosis not present

## 2021-04-20 DIAGNOSIS — J029 Acute pharyngitis, unspecified: Secondary | ICD-10-CM | POA: Diagnosis not present

## 2021-04-20 DIAGNOSIS — R6889 Other general symptoms and signs: Secondary | ICD-10-CM | POA: Diagnosis not present

## 2021-04-20 DIAGNOSIS — R0981 Nasal congestion: Secondary | ICD-10-CM | POA: Diagnosis not present

## 2021-04-20 DIAGNOSIS — Z6832 Body mass index (BMI) 32.0-32.9, adult: Secondary | ICD-10-CM | POA: Diagnosis not present

## 2021-06-05 ENCOUNTER — Ambulatory Visit: Payer: 59 | Admitting: Allergy & Immunology

## 2021-06-07 ENCOUNTER — Encounter: Payer: Self-pay | Admitting: Allergy & Immunology

## 2021-06-07 ENCOUNTER — Ambulatory Visit: Payer: 59 | Admitting: Allergy & Immunology

## 2021-06-07 ENCOUNTER — Other Ambulatory Visit: Payer: Self-pay

## 2021-06-07 VITALS — BP 110/72 | HR 77 | Temp 98.6°F | Resp 18 | Ht 65.0 in | Wt 206.8 lb

## 2021-06-07 DIAGNOSIS — J310 Chronic rhinitis: Secondary | ICD-10-CM

## 2021-06-07 DIAGNOSIS — J4540 Moderate persistent asthma, uncomplicated: Secondary | ICD-10-CM | POA: Diagnosis not present

## 2021-06-07 MED ORDER — LEVOCETIRIZINE DIHYDROCHLORIDE 5 MG PO TABS: 5.0000 mg | ORAL_TABLET | Freq: Every evening | ORAL | 5 refills | Status: DC

## 2021-06-07 MED ORDER — TRELEGY ELLIPTA 100-62.5-25 MCG/ACT IN AEPB: 1.0000 | INHALATION_SPRAY | Freq: Every day | RESPIRATORY_TRACT | 5 refills | Status: DC

## 2021-06-07 MED ORDER — IPRATROPIUM BROMIDE 0.03 % NA SOLN: NASAL | 5 refills | Status: DC

## 2021-06-07 MED ORDER — MONTELUKAST SODIUM 10 MG PO TABS: 10.0000 mg | ORAL_TABLET | Freq: Every day | ORAL | 5 refills | Status: DC

## 2021-06-07 MED ORDER — ALBUTEROL SULFATE HFA 108 (90 BASE) MCG/ACT IN AERS: 2.0000 | INHALATION_SPRAY | Freq: Four times a day (QID) | RESPIRATORY_TRACT | 1 refills | Status: DC | PRN

## 2021-06-07 MED ORDER — RYALTRIS 665-25 MCG/ACT NA SUSP
2.0000 | Freq: Two times a day (BID) | NASAL | 5 refills | Status: DC | PRN
Start: 2021-06-07 — End: 2021-08-09

## 2021-06-07 NOTE — Patient Instructions (Signed)
1. Moderate persistent asthma, uncomplicated ?- Lung testing looked normal, but it did get even better with the albuterol puffs. ?- This certainly points towards a diagnosis of asthma.  ?- We were going to stop the Advair and start Trelegy 100 mcg instead. ?- This contains 3 medications to help keep inflammation under control in your lungs and help keep them open. ?- I think this will be cheaper than the Advair is. ?- We are going to get some labs in case we need to start a biologic (injectable medication) for asthma.  ?- Daily controller medication(s): Trelegy 100/62.5/25 one puff once daily ?- Prior to physical activity: albuterol 2 puffs 10-15 minutes before physical activity. ?- Rescue medications: albuterol 4 puffs every 4-6 hours as needed ?- Asthma control goals:  ?* Full participation in all desired activities (may need albuterol before activity) ?* Albuterol use two time or less a week on average (not counting use with activity) ?* Cough interfering with sleep two time or less a month ?* Oral steroids no more than once a year ?* No hospitalizations ? ?2. Chronic rhinitis ?- Testing today showed: grasses, trees, indoor molds, outdoor molds, dust mites, and cat. ?- Copy of test results provided.  ?- Avoidance measures provided. ?- Stop taking: Flonase and Astelin ?- Continue with: Xyzal (levocetirizine) '5mg'$  tablet once daily, Singulair (montelukast) '10mg'$  daily, and Atrovent (ipratropium) 0.03% one spray per nostril 2-3 times daily AS NEEDED (CAN BE OVER DRYING) ?- Start taking: Ryaltris (olopatadine/mometasone) two sprays per nostril 1-2 times daily AS NEEDED ?- You can use an extra dose of the antihistamine, if needed, for breakthrough symptoms.  ?- Consider nasal saline rinses 1-2 times daily to remove allergens from the nasal cavities as well as help with mucous clearance (this is especially helpful to do before the nasal sprays are given) ?- Consider allergy shots as a means of long-term control. ?-  Allergy shots "re-train" and "reset" the immune system to ignore environmental allergens and decrease the resulting immune response to those allergens (sneezing, itchy watery eyes, runny nose, nasal congestion, etc).    ?- Allergy shots improve symptoms in 75-85% of patients.  ?- We can discuss more at the next appointment if the medications are not working for you.  ? ?3. Return in about 2 months (around 08/07/2021).  ? ? ?Please inform us of any Emergency Department visits, hospitalizations, or changes in symptoms. Call us before going to the ED for breathing or allergy symptoms since we might be able to fit you in for a sick visit. Feel free to contact us anytime with any questions, problems, or concerns. ? ?It was a pleasure to meet you today! ? ?Websites that have reliable patient information: ?1. American Academy of Asthma, Allergy, and Immunology: www.aaaai.org ?2. Food Allergy Research and Education (FARE): foodallergy.org ?3. Mothers of Asthmatics: http://www.asthmacommunitynetwork.org ?4. [employer] of Allergy, Asthma, and Immunology: MonthlyElectricBill.co.uk ? ? ?COVID-19 Vaccine Information can be found at: ShippingScam.co.uk For questions related to vaccine distribution or appointments, please email [email or web address] or call [phone].  ? ?We realize that you might be concerned about having an allergic reaction to the COVID19 vaccines. To help with that concern, WE ARE OFFERING THE COVID19 VACCINES IN OUR OFFICE! Ask the front desk for dates!  ? ? ? ??Like? [place] on Facebook and Instagram for our latest updates!  ?  ? ? ?A healthy democracy works best when [employer] participate! Make sure you are registered to vote! If you have moved or changed any  of your contact information, you will need to get this updated before voting! ? ?In some cases, you MAY be able to register to vote online: [email or web address] ? ? ? ? ?  Airborne Adult Perc - 06/07/21 1543   ? ? Time Antigen Placed 0623   ? Allergen Manufacturer Lavella Hammock   ? Location Back   ? Number of Test 59   ? Panel 1 Select   ? 1. Control-Buffer 50% Glycerol Negative   ? 2. Control-Histamine 1 mg/ml 2+   ? 3. Albumin saline Negative   ? 4. [place] Negative   ? 5. [place] Negative   ? 6. Johnson Negative   ? 7. Convoy Blue Negative   ? 8. Meadow Fescue Negative   ? 9. Perennial Rye Negative   ? 10. Sweet Vernal Negative   ? 11. Timothy Negative   ? 12. Cocklebur Negative   ? 13. Burweed Marshelder Negative   ? 14. Ragweed, short Negative   ? 15. Ragweed, Giant Negative   ? 16. Plantain,  English Negative   ? 17. Lamb's Quarters Negative   ? 18. Sheep Sorrell Negative   ? 19. Rough Pigweed Negative   ? 20. Marsh Elder, Rough Negative   ? 21. Mugwort, Common Negative   ? 22. Ash mix Negative   ? 23. Wendee Copp mix Negative   ? 24. Beech American Negative   ? 25. Box, Elder Negative   ? 26. Cedar, red Negative   ? 27. Cottonwood, [place] Negative   ? 28. Elm mix Negative   ? 29. Hickory Negative   ? 30. Maple mix Negative   ? 31. Oak, [place] mix Negative   ? 32. Pecan Pollen Negative   ? 33. Pine mix Negative   ? 34. Sycamore Eastern Negative   ? 35. Walnut, Black Pollen Negative   ? 36. Alternaria alternata Negative   ? 85. Cladosporium Herbarum Negative   ? 38. Aspergillus mix Negative   ? 39. Penicillium mix Negative   ? 40. Bipolaris sorokiniana (Helminthosporium) Negative   ? 41. Drechslera spicifera (Curvularia) 3+   ? 42. Mucor plumbeus Negative   ? 43. Fusarium moniliforme Negative   ? 44. Aureobasidium pullulans (pullulara) Negative   ? 45. Rhizopus oryzae Negative   ? 46. Botrytis cinera Negative   ? 47. Epicoccum nigrum Negative   ? 48. Phoma betae Negative   ? 49. Candida Albicans Negative   ? 50. Trichophyton mentagrophytes Negative   ? 51. Mite, D Farinae  5,000 AU/ml 3+   ? 52. Mite, D Pteronyssinus  5,000 AU/ml 3+   ? 53. Cat Hair 10,000 BAU/ml Negative   ? 54.  Dog  Epithelia Negative   ? 55. Mixed Feathers Negative   ? 56. Horse Epithelia Negative   ? 57. Cockroach, [place] Negative   ? 58. Mouse Negative   ? 59. Tobacco Leaf Negative   ? ?  ?  ? ?  ? ? Intradermal - 06/07/21 1544   ? ? Time Antigen Placed 1550   ? Allergen Manufacturer Lavella Hammock   ? Location Arm   ? Number of Test 13   ? Intradermal Select   ? Control Negative   ? [place] 3+   ? Johnson 3+   ? 7 Grass Negative   ? Ragweed mix Negative   ? Weed mix Negative   ? Tree mix 3+   ? Mold 1 1+   ? Mold 2 2+   ? Mold  4 1+   ? Cat 2+   ? Dog Negative   ? Cockroach Negative   ? ?  ?  ? ?  ? ? ?Reducing Pollen Exposure ? ?The American Academy of Allergy, Asthma and Immunology suggests the following steps to reduce your exposure to pollen during allergy seasons. ?   ?Do not hang sheets or clothing out to dry; pollen may collect on these items. ?Do not mow lawns or spend time around freshly cut grass; mowing stirs up pollen. ?Keep windows closed at night.  Keep car windows closed while driving. ?Minimize morning activities outdoors, a time when pollen counts are usually at their highest. ?Stay indoors as much as possible when pollen counts or humidity is high and on windy days when pollen tends to remain in the air longer. ?Use air conditioning when possible.  Many air conditioners have filters that trap the pollen spores. ?Use a HEPA room air filter to remove pollen form the indoor air you breathe. ? ?Control of Mold Allergen  ? ?Mold and fungi can grow on a variety of surfaces provided certain temperature and moisture conditions exist.  Outdoor molds grow on plants, decaying vegetation and soil.  The major outdoor mold, Alternaria and Cladosporium, are found in very high numbers during hot and dry conditions.  Generally, a late Summer - Fall peak is seen for common outdoor fungal spores.  Rain will temporarily lower outdoor mold spore count, but counts rise rapidly when the rainy period ends.  The most important indoor molds  are Aspergillus and Penicillium.  Dark, humid and poorly ventilated basements are ideal sites for mold growth.  The next most common sites of mold growth are the bathroom and the kitchen. ? ?Outdoor (Seasonal) Mo
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
## 2021-06-10 ENCOUNTER — Encounter: Payer: Self-pay | Admitting: Allergy & Immunology

## 2021-06-11 ENCOUNTER — Other Ambulatory Visit: Payer: Self-pay | Admitting: *Deleted

## 2021-06-11 MED ORDER — AZELASTINE-FLUTICASONE 137-50 MCG/ACT NA SUSP: 1.0000 | Freq: Two times a day (BID) | NASAL | 5 refills | Status: DC

## 2021-06-11 MED ORDER — FLUTICASONE-SALMETEROL 500-50 MCG/ACT IN AEPB: 1.0000 | INHALATION_SPRAY | Freq: Two times a day (BID) | RESPIRATORY_TRACT | 5 refills | Status: DC

## 2021-06-12 DIAGNOSIS — J3089 Other allergic rhinitis: Secondary | ICD-10-CM | POA: Diagnosis not present

## 2021-06-17 LAB — CBC WITH DIFFERENTIAL/PLATELET
Basophils Absolute: 0.1 10*3/uL (ref 0.0–0.2)
Basos: 1 %
EOS (ABSOLUTE): 0.2 10*3/uL (ref 0.0–0.4)
Eos: 2 %
Hematocrit: 36.5 % (ref 34.0–46.6)
Hemoglobin: 12.7 g/dL (ref 11.1–15.9)
Immature Grans (Abs): 0 10*3/uL (ref 0.0–0.1)
Immature Granulocytes: 0 %
Lymphocytes Absolute: 2.3 10*3/uL (ref 0.7–3.1)
Lymphs: 23 %
MCH: 31.8 pg (ref 26.6–33.0)
MCHC: 34.8 g/dL (ref 31.5–35.7)
MCV: 92 fL (ref 79–97)
Monocytes Absolute: 0.6 10*3/uL (ref 0.1–0.9)
Monocytes: 6 %
Neutrophils Absolute: 6.9 10*3/uL (ref 1.4–7.0)
Neutrophils: 68 %
Platelets: 288 10*3/uL (ref 150–450)
RBC: 3.99 x10E6/uL (ref 3.77–5.28)
RDW: 12.4 % (ref 11.7–15.4)
WBC: 10 10*3/uL (ref 3.4–10.8)

## 2021-06-17 LAB — ASPERGILLUS PRECIPITINS
A.Fumigatus #1 Abs: NEGATIVE
Aspergillus Flavus Antibodies: NEGATIVE
Aspergillus Niger Antibodies: NEGATIVE
Aspergillus glaucus IgG: NEGATIVE
Aspergillus nidulans IgG: NEGATIVE
Aspergillus terreus IgG: NEGATIVE

## 2021-06-17 LAB — ALPHA-1-ANTITRYPSIN: A-1 Antitrypsin: 137 mg/dL (ref 101–187)

## 2021-06-17 LAB — ANCA TITERS
Atypical pANCA: <1:20 {titer}
C-ANCA: <1:20 {titer}
P-ANCA: <1:20 {titer}

## 2021-06-17 LAB — IGE: IgE (Immunoglobulin E), Serum: 28 IU/mL (ref 6–495)

## 2021-06-19 ENCOUNTER — Encounter: Payer: Self-pay | Admitting: Allergy & Immunology

## 2021-07-10 DIAGNOSIS — Z6832 Body mass index (BMI) 32.0-32.9, adult: Secondary | ICD-10-CM | POA: Diagnosis not present

## 2021-07-10 DIAGNOSIS — J0100 Acute maxillary sinusitis, unspecified: Secondary | ICD-10-CM | POA: Diagnosis not present

## 2021-07-10 DIAGNOSIS — J029 Acute pharyngitis, unspecified: Secondary | ICD-10-CM | POA: Diagnosis not present

## 2021-07-10 DIAGNOSIS — I10 Essential (primary) hypertension: Secondary | ICD-10-CM | POA: Diagnosis not present

## 2021-07-29 DIAGNOSIS — J029 Acute pharyngitis, unspecified: Secondary | ICD-10-CM | POA: Diagnosis not present

## 2021-07-29 DIAGNOSIS — I10 Essential (primary) hypertension: Secondary | ICD-10-CM | POA: Diagnosis not present

## 2021-07-29 DIAGNOSIS — Z6833 Body mass index (BMI) 33.0-33.9, adult: Secondary | ICD-10-CM | POA: Diagnosis not present

## 2021-07-29 DIAGNOSIS — J302 Other seasonal allergic rhinitis: Secondary | ICD-10-CM | POA: Diagnosis not present

## 2021-08-09 ENCOUNTER — Encounter: Payer: Self-pay | Admitting: Allergy & Immunology

## 2021-08-09 ENCOUNTER — Telehealth: Payer: Self-pay

## 2021-08-09 ENCOUNTER — Ambulatory Visit: Payer: 59 | Admitting: Allergy & Immunology

## 2021-08-09 VITALS — BP 124/78 | HR 79 | Temp 98.0°F | Resp 20

## 2021-08-09 DIAGNOSIS — K219 Gastro-esophageal reflux disease without esophagitis: Secondary | ICD-10-CM

## 2021-08-09 DIAGNOSIS — J3089 Other allergic rhinitis: Secondary | ICD-10-CM | POA: Diagnosis not present

## 2021-08-09 DIAGNOSIS — J4540 Moderate persistent asthma, uncomplicated: Secondary | ICD-10-CM

## 2021-08-09 DIAGNOSIS — J302 Other seasonal allergic rhinitis: Secondary | ICD-10-CM

## 2021-08-09 MED ORDER — ARNUITY ELLIPTA 100 MCG/ACT IN AEPB: 1.0000 | INHALATION_SPRAY | Freq: Every day | RESPIRATORY_TRACT | 5 refills | Status: AC

## 2021-08-09 NOTE — Patient Instructions (Addendum)
1. Moderate persistent asthma, uncomplicated - Lung testing looked good today. - We are going to add on Arnuity one puff once daily (inhaled steroid). - This might work well with the Advair to see if this can control your nighttime symptoms.  - Daily controller medication(s): Arnuity 145mg one puff once daily and Advair 500/52m one puff twice daily - Prior to physical activity: albuterol 2 puffs 10-15 minutes before physical activity. - Rescue medications: albuterol 4 puffs every 4-6 hours as needed - Asthma control goals:  * Full participation in all desired activities (may need albuterol before activity) * Albuterol use two time or less a week on average (not counting use with activity) * Cough interfering with sleep two time or less a month * Oral steroids no more than once a year * No hospitalizations  2. Chronic rhinitis (grasses, trees, indoor molds, outdoor molds, dust mites, and cat) - Continue with: Xyzal (levocetirizine) '5mg'$  tablet once daily, Singulair (montelukast) '10mg'$  daily, and Atrovent (ipratropium) 0.03% one spray per nostril 2-3 times daily AS NEEDED (CAN BE OVER DRYING) - Continue taking: Dymista two sprays per nostril nightly  - You can use an extra dose of the antihistamine, if needed, for breakthrough symptoms.  - Consider nasal saline rinses 1-2 times daily to remove allergens from the nasal cavities as well as help with mucous clearance (this is especially helpful to do before the nasal sprays are given)  3. Return in about 6 months (around 02/09/2022).    Please inform us[facility]ore[facility] any Emergency Department visits, hospitalizations, or changes in symptoms. Call usKoreaefore going to the ED for breathing or allergy symptoms since we might be able to fit you in for a sick visit. Feel free to contact usKoreanytime with any questions, problems, or concerns.  It was a pleasure to see you again today!  Websites that have reliable patient information: 1. American Academy of Asthma,  Allergy, and Immunology: www.aaaai.org 2. Food Allergy Research and Education (FARE): foodallergy.org 3. Mothers of Asthmatics: http://www.asthmacommunitynetwork.org 4. American College of Allergy, Asthma, and Immunology: www.acaai.org   COVID-19 Vaccine Information can be found at: [email or web address] questions related to vaccine distribution or appointments, please email [email or web address] or call [phone]  We realize that you might be concerned about having an allergic reaction to the COVID19 vaccines. To help with that concern, WE ARE OFFERING THE COVID19 VACCINES IN OUR OFFICE! Ask the front desk for dates!     "Like" us[facility] Facebook and Instagram for our latest updates!      A healthy democracy works best when ALNew York Life Insurancearticipate! Make sure you are registered to vote! If you have moved or changed any of your contact information, you will need to get this updated before voting!  In some cases, you MAY be able to register to vote online: [email or web address]

## 2021-08-09 NOTE — Telephone Encounter (Signed)
Good Evening Kendra Soto,   I canceled the prescription at [facility] to try [facility] since that is where your insurance goes - the cost is $166.80. I was advised it is a deductible issue - possibly you may have not met all of your deductible yet????     I will send Dr. Ernst Bowler a message as well advising of the same to see what alternatives or options he suggest.    With our office being closed on Monday, you may not receive a response until Tuesday.   Have a great weekend!!   Darryll Capers message to provider for advice.

## 2021-08-09 NOTE — Progress Notes (Signed)
FOLLOW UP  Date of Service/Encounter:  08/09/21   Assessment:   Moderate persistent asthma, uncomplicated   Perennial and seasonal allergic rhinitis (grasses, trees, indoor molds, outdoor molds, dust mites, and cat)   History of rheumatoid arthiritis - was on MTX and prednisone without relief (discontinued January/February 2022) most recently followed by Dr. Benjamine Mola   Hypothyroidism   GERD - on omeprazole   Kendra presents for a follow-up visit.  She largely is well controlled on the current regimen, but she has a lot of nighttime symptoms.  One would think that this is related to reflux, but she is already increased her omeprazole with no improvement in the symptoms.  She does have an elevated absolute eosinophil count of 200, which would qualify her for [facility].  She is worried about the cost of the medication as well as the idea of giving it to herself.  I did tell her we are more than happy to give it in our office, but she does live around 30 minutes away.  She would like to start with a larger amount of inhaled steroid.  She is already on the top dose of her Advair, so we are going to add Arnuity 100 mcg 1 puff at night to see if this is enough to increase the anti-inflammatory activity.  I think a lot of her worsening symptoms are related to either exposure to viral URIs with her granddaughter or the fact that she has been off of prednisone which is being used on a chronic basis to control her rheumatoid arthritis.  Plan/Recommendations:   1. Moderate persistent asthma, uncomplicated - Lung testing looked good today. - We are going to add on Arnuity one puff once daily (inhaled steroid). - This might work well with the Advair to see if this can control your nighttime symptoms.  - Daily controller medication(s): Arnuity 18mg one puff once daily and Advair 500/539m one puff twice daily - Prior to physical activity: albuterol 2 puffs 10-15 minutes before physical activity. - Rescue  medications: albuterol 4 puffs every 4-6 hours as needed - Asthma control goals:  * Full participation in all desired activities (may need albuterol before activity) * Albuterol use two time or less a week on average (not counting use with activity) * Cough interfering with sleep two time or less a month * Oral steroids no more than once a year * No hospitalizations  2. Chronic rhinitis (grasses, trees, indoor molds, outdoor molds, dust mites, and cat) - Continue with: Xyzal (levocetirizine) '5mg'$  tablet once daily, Singulair (montelukast) '10mg'$  daily, and Atrovent (ipratropium) 0.03% one spray per nostril 2-3 times daily AS NEEDED (CAN BE OVER DRYING) - Continue taking: Dymista two sprays per nostril nightly  - You can use an extra dose of the antihistamine, if needed, for breakthrough symptoms.  - Consider nasal saline rinses 1-2 times daily to remove allergens from the nasal cavities as well as help with mucous clearance (this is especially helpful to do before the nasal sprays are given)  3. Return in about 6 months (around 02/09/2022).   Subjective:   Kendra Soto a [age].o. female presenting today for follow up of  Chief Complaint  Patient presents with   Asthma    Asthma has been off and on. It clears up then she feels like it gets triggered by something.  It's mostly during the night and in the morning. Phlegm in the throat and feels the need to cough that up.  Kendra Soto has a history of the following: Patient Active Problem List   Diagnosis Date Noted   Rheumatoid arthritis ([place]) 04/12/2020   High risk medication use 04/12/2020   Chronic RUQ pain 04/16/2018   Abdominal pain, chronic, epigastric 04/16/2018   GERD (gastroesophageal reflux disease) 04/16/2018   Esophageal dysphagia 04/16/2018    History obtained from: chart review and patient.  Kendra Soto is a 46 y.o. female presenting for a follow up visit.  She was last seen in March 2023.  At that time, her lung testing  looked normal but evaluated better with albuterol.  We stopped her Advair and started Trelegy 100 mcg 1 puff once daily.  I felt that Trelegy was indicated due to the additional bronchodilator as well as a co-pay card.  We continue with albuterol as needed.  She had environmental allergy testing that was positive to grasses, trees, indoor and outdoor molds, dust mite, and cat.  We stopped Flonase and Astelin and continue with Xyzal as well as Atrovent.  We started her on Ryaltris.  We talked about allergen immunotherapy, but insurance coverage was a problem.  Overall, we felt that her asthma control is worsened because she was weaned off of her prednisone and methotrexate which was treating her rheumatoid arthritis.  However, upon obtaining a second opinion, it turns out that she likely never had rheumatoid arthritis which is why these medications were being weaned.  Since last visit, she has done well.  Asthma/Respiratory Symptom History: She went back to the Advair one puff twice daily of this. She reports that this is working ok. But she is not sure if this is enough.  She did feel that the Trelegy worked better than the Advair. Symptoms tend to get worse at night. It seems to start efore she goes to bed. She can start feeling the congestion. She has started taking the albuterol and the Advair earlier in the evening. This seems to make things better overall.   Of note, she started taking care of her granddaughter more routinely in the last 9 months. Since the end of 2022, the congestion has worsened.  Therefore, she thinks a lot of her worsening symptoms might be related to viral URIs when she is catching from her granddaughter.  Allergic Rhinitis Symptom History: She is doing the Dymsita generic two sprays per nostril at night. She is doing the ipratropium in the morning.  She has not been on antibiotics at all for her symptoms.   GERD Symptom History: She is on omeprazole '40mg'$  in the morning. She was  increased from the '20mg'$  dose relatively recently.  It did not really seem to change her nighttime symptoms at all.  Otherwise, there have been no changes to her past medical history, surgical history, family history, or social history.    Review of Systems  Constitutional: Negative.  Negative for fever, malaise/fatigue and weight loss.  HENT: Negative.  Negative for congestion, ear discharge and ear pain.   Eyes:  Negative for pain, discharge and redness.  Respiratory:  Positive for cough, sputum production and wheezing. Negative for shortness of breath.   Cardiovascular: Negative.  Negative for chest pain and palpitations.  Gastrointestinal:  Negative for abdominal pain, heartburn and nausea.  Skin: Negative.  Negative for itching and rash.  Neurological:  Negative for dizziness and headaches.  Endo/Heme/Allergies:  Negative for environmental allergies. Does not bruise/bleed easily.      Objective:   Blood pressure 124/78, pulse 79, temperature 98 F (36.7 C),  resp. rate 20, last menstrual period 01/22/2021, SpO2 97 %. There is no height or weight on file to calculate BMI.    Physical Exam Vitals reviewed.  Constitutional:      Appearance: She is well-developed.  HENT:     Head: Normocephalic and atraumatic.     Right Ear: Tympanic membrane, ear canal and external ear normal. No drainage, swelling or tenderness. Tympanic membrane is not injected, scarred, erythematous, retracted or bulging.     Left Ear: Tympanic membrane, ear canal and external ear normal. No drainage, swelling or tenderness. Tympanic membrane is not injected, scarred, erythematous, retracted or bulging.     Nose: No nasal deformity, septal deviation, mucosal edema or rhinorrhea.     Right Turbinates: Enlarged, swollen and pale.     Left Turbinates: Enlarged, swollen and pale.     Right Sinus: No maxillary sinus tenderness or frontal sinus tenderness.     Left Sinus: No maxillary sinus tenderness or frontal  sinus tenderness.     Comments: No nasal polyps.    Mouth/Throat:     Lips: Pink.     Mouth: Mucous membranes are moist. Mucous membranes are not pale and not dry.     Pharynx: Uvula midline.     Comments: Cobblestoning present in the posterior oropharynx. Eyes:     General: Lids are normal. Allergic shiner present.        Right eye: No discharge.        Left eye: No discharge.     Conjunctiva/sclera: Conjunctivae normal.     Right eye: Right conjunctiva is not injected. No chemosis.    Left eye: Left conjunctiva is not injected. No chemosis.    Pupils: Pupils are equal, round, and reactive to light.  Cardiovascular:     Rate and Rhythm: Normal rate and regular rhythm.     Heart sounds: Normal heart sounds.  Pulmonary:     Effort: Pulmonary effort is normal. No tachypnea, accessory muscle usage or respiratory distress.     Breath sounds: Normal breath sounds. No wheezing, rhonchi or rales.     Comments: Moving air well in all lung fields. Chest:     Chest wall: No tenderness.  Abdominal:     Tenderness: There is no abdominal tenderness. There is no guarding or rebound.  Lymphadenopathy:     Head:     Right side of head: No submandibular, tonsillar or occipital adenopathy.     Left side of head: No submandibular, tonsillar or occipital adenopathy.     Cervical: No cervical adenopathy.  Skin:    Coloration: Skin is not pale.     Findings: No abrasion, erythema, petechiae or rash. Rash is not papular, urticarial or vesicular.  Neurological:     Mental Status: She is alert.  Psychiatric:        Behavior: Behavior is cooperative.     Diagnostic studies:    Spirometry: results normal (FEV1: 2.66/90%, FVC: 3.15/86%, FEV1/FVC: 84%).    Spirometry consistent with normal pattern.   Allergy Studies: none       Salvatore Marvel, MD  [facility] [place] [facility] [place]

## 2021-08-13 NOTE — Telephone Encounter (Signed)
What medication was this?   Salvatore Marvel, MD [facility] [place] [place]

## 2021-08-15 NOTE — Telephone Encounter (Signed)
LVM for patient to call the office back to see which medication she would prefer for [place] to send into her pharmacy. Flovent 220 mcg one puff once daily or Qvar 80 mcg one puff once daily at night.

## 2021-08-15 NOTE — Telephone Encounter (Signed)
Dr. Darnell Level,  Arnuity 100 mcg 1 puff at night.

## 2021-08-15 NOTE — Telephone Encounter (Signed)
We can try Flovent 256mg one puff once daily at night or Qvar 881m one puff once daily at night.   JoSalvatore MarvelMD [facility] [place]

## 2021-10-03 DIAGNOSIS — M549 Dorsalgia, unspecified: Secondary | ICD-10-CM | POA: Diagnosis not present

## 2021-10-03 DIAGNOSIS — Z8669 Personal history of other diseases of the nervous system and sense organs: Secondary | ICD-10-CM | POA: Diagnosis not present

## 2021-10-03 DIAGNOSIS — M542 Cervicalgia: Secondary | ICD-10-CM | POA: Diagnosis not present

## 2021-10-03 DIAGNOSIS — E871 Hypo-osmolality and hyponatremia: Secondary | ICD-10-CM | POA: Diagnosis not present

## 2021-10-03 DIAGNOSIS — R030 Elevated blood-pressure reading, without diagnosis of hypertension: Secondary | ICD-10-CM | POA: Diagnosis not present

## 2021-10-03 DIAGNOSIS — E039 Hypothyroidism, unspecified: Secondary | ICD-10-CM | POA: Diagnosis not present

## 2021-10-03 DIAGNOSIS — Z8679 Personal history of other diseases of the circulatory system: Secondary | ICD-10-CM | POA: Diagnosis not present

## 2021-10-03 DIAGNOSIS — Z6833 Body mass index (BMI) 33.0-33.9, adult: Secondary | ICD-10-CM | POA: Diagnosis not present

## 2021-10-08 ENCOUNTER — Encounter: Payer: Self-pay | Admitting: *Deleted

## 2021-10-30 ENCOUNTER — Encounter: Payer: Self-pay | Admitting: *Deleted

## 2021-10-30 NOTE — Patient Instructions (Signed)
Procedure: Colonoscopy  Estimated body mass index is 34.41 kg/m as calculated from the following:   Height as of 06/07/21: '5\' 5"'$  (1.651 m).   Weight as of 06/07/21: 206 lb 12.8 oz (93.8 kg).   Have you had a colonoscopy before?  no  Do you have family history of colon cancer  no  Do you have a family history of polyps? no  Previous colonoscopy with polyps removed? no  Do you have a history colorectal cancer?   no  Are you diabetic?  no  Do you have a prosthetic or mechanical heart valve? no  Do you have a pacemaker/defibrillator?   no  Have you had endocarditis/atrial fibrillation?  no  Do you use supplemental oxygen/CPAP?  no  Have you had joint replacement within the last 12 months?  no  Do you tend to be constipated or have to use laxatives?  no   Do you have history of alcohol use? If yes, how much and how often.  no  Do you have history or are you using drugs? If yes, what do are you  using?  no  Have you ever had a stroke/heart attack?  no  Have you ever had a heart or other vascular stent placed,?no  Do you take weight loss medication? no  female patients,: have you had a hysterectomy? yes                              are you post menopausal?  no                              do you still have your menstrual cycle? no    Date of last menstrual period. 01/2021  Do you take any blood-thinning medications such as: (Plavix, aspirin, Coumadin, Aggrenox, Brilinta, Xarelto, Eliquis, Pradaxa, Savaysa or Effient) no  If yes we need the name, milligram, dosage and who is prescribing doctor:               Current Outpatient Medications  Medication Sig Dispense Refill   albuterol (VENTOLIN HFA) 108 (90 Base) MCG/ACT inhaler Inhale 2 puffs into the lungs every 6 (six) hours as needed for wheezing or shortness of breath. 18 g 1   Azelastine-Fluticasone 137-50 MCG/ACT SUSP Place 1 spray into the nose 2 (two) times daily. 23 g 5   fluticasone-salmeterol (ADVAIR) 500-50  MCG/ACT AEPB Inhale 1 puff into the lungs in the morning and at bedtime. 60 each 5   ipratropium (ATROVENT) 0.03 % nasal spray 1 spray per nostril 2-3 times daily as needed. 30 mL 5   levocetirizine (XYZAL) 5 MG tablet Take 1 tablet (5 mg total) by mouth every evening. 30 tablet 5   levothyroxine (SYNTHROID) 75 MCG tablet Take 75 mcg by mouth daily before breakfast.     metoprolol succinate (TOPROL-XL) 25 MG 24 hr tablet Take 25 mg by mouth every morning.     montelukast (SINGULAIR) 10 MG tablet Take 10 mg by mouth at bedtime.     omeprazole (PRILOSEC) 40 MG capsule Take 40 mg by mouth daily.     oxybutynin (DITROPAN-XL) 10 MG 24 hr tablet Take 10 mg by mouth every evening.     No current facility-administered medications for this visit.    Allergies  Allergen Reactions   Bee Pollen Cough, Other (See Comments) and Shortness Of Breath   Short Ragweed  Pollen Ext Cough, Other (See Comments) and Shortness Of Breath

## 2021-10-31 ENCOUNTER — Ambulatory Visit (INDEPENDENT_AMBULATORY_CARE_PROVIDER_SITE_OTHER): Payer: 59

## 2021-10-31 ENCOUNTER — Encounter: Payer: Self-pay | Admitting: Cardiology

## 2021-10-31 ENCOUNTER — Ambulatory Visit: Payer: 59 | Admitting: Cardiology

## 2021-10-31 VITALS — BP 100/78 | HR 84 | Ht 65.0 in | Wt 207.0 lb

## 2021-10-31 DIAGNOSIS — R002 Palpitations: Secondary | ICD-10-CM | POA: Diagnosis not present

## 2021-10-31 DIAGNOSIS — I493 Ventricular premature depolarization: Secondary | ICD-10-CM | POA: Diagnosis not present

## 2021-10-31 NOTE — Progress Notes (Unsigned)
ZIO XT mailed to pt's home address. 

## 2021-10-31 NOTE — Progress Notes (Signed)
Cardiology Office Note:    Date:  10/31/2021   ID:  Kendra Soto, DOB Aug 25, 1975, MRN [id number]  PCP:  [place] Nation, MD   [facility] Providers Cardiologist:  None     Referring MD: [place] Nation, MD    History of Present Illness:    Kendra Soto is a 46 y.o. female here for the evaluation of palpitations at the request of Dr. Stana Bunting.  Saw cardiology about 2-3 years ago. Has thumping and and a pause (describes PVCs). Did several tests including stress test as well as echocardiogram see below 2021. Toprol does not seem to help. Feels at night. Left side.   Minor light headed at times, HA. No syncope. Rare sharp pain in center of center of chest. ?GERD.   She has hypothyroidism on 75 mcg of levothyroxine.  She takes metoprolol XL because of history of tachycardia in the past. Has low BP at times with Toprol.   Has a history of migraines, asthma  EKG from October 03, 2021 shows heart rate of 70 with PVC no other significant abnormalities.  Potassium 4.2 creatinine 0.89 ALT 8, white blood cell count 10, TSH 5.8 mildly elevated, LDL 74, triglycerides 69, hemoglobin A1c 5.9  Past Medical History:  Diagnosis Date   Allergies    Asthma    Dysrhythmia    GERD (gastroesophageal reflux disease)    History of kidney stones    Hypothyroid    Overactive bladder    Palpitations     Past Surgical History:  Procedure Laterality Date   CHOLECYSTECTOMY  2016   ESOPHAGOGASTRODUODENOSCOPY N/A 04/30/2018   normal. Empiric dilatation   MALONEY DILATION N/A 04/30/2018   Procedure: Venia Minks DILATION;  Surgeon: Daneil Dolin, MD;  Location: [facility];  Service: Endoscopy;  Laterality: N/A;   TUBAL LIGATION     VAGINAL HYSTERECTOMY N/A 01/22/2021   Procedure: HYSTERECTOMY VAGINAL;  Surgeon: Janyth Pupa, DO;  Location: [facility] ORS;  Service: Gynecology;  Laterality: N/A;   WISDOM TOOTH EXTRACTION      Current Medications: Current Meds  Medication Sig   albuterol  (VENTOLIN HFA) 108 (90 Base) MCG/ACT inhaler Inhale 2 puffs into the lungs every 6 (six) hours as needed for wheezing or shortness of breath.   Azelastine-Fluticasone 137-50 MCG/ACT SUSP Place 1 spray into the nose 2 (two) times daily. (Patient taking differently: Place 2 sprays into the nose daily.)   fluticasone-salmeterol (ADVAIR) 500-50 MCG/ACT AEPB Inhale 1 puff into the lungs in the morning and at bedtime.   ipratropium (ATROVENT) 0.03 % nasal spray 1 spray per nostril 2-3 times daily as needed.   levocetirizine (XYZAL) 5 MG tablet Take 1 tablet (5 mg total) by mouth every evening.   levothyroxine (SYNTHROID) 75 MCG tablet Take 75 mcg by mouth daily before breakfast.   metoprolol succinate (TOPROL-XL) 25 MG 24 hr tablet Take 25 mg by mouth as needed.   montelukast (SINGULAIR) 10 MG tablet Take 10 mg by mouth at bedtime.   omeprazole (PRILOSEC) 40 MG capsule Take 40 mg by mouth daily.   oxybutynin (DITROPAN-XL) 10 MG 24 hr tablet Take 10 mg by mouth every evening.     Allergies:   Bee pollen and Short ragweed pollen ext   Social History   Socioeconomic History   Marital status: Married    Spouse name: Not on file   Number of children: 3   Years of education: Not on file   Highest education level: Not on file  Occupational History   Not on file  Tobacco Use   Smoking status: Former    Types: Cigarettes    Quit date: 2009    Years since quitting: 14.6   Smokeless tobacco: Never  Vaping Use   Vaping Use: Never used  Substance and Sexual Activity   Alcohol use: No   Drug use: No   Sexual activity: Yes    Birth control/protection: Surgical    Comment: hyst  Other Topics Concern   Not on file  Social History Narrative   Not on file   Social Determinants of Health   Financial Resource Strain: Low Risk  (05/08/2020)   Overall Financial Resource Strain (CARDIA)    Difficulty of Paying Living Expenses: Not very hard  Food Insecurity: Food Insecurity Present (05/08/2020)    Hunger Vital Sign    Worried About Running Out of Food in the Last Year: Often true    Ran Out of Food in the Last Year: Never true  Transportation Needs: No Transportation Needs (05/08/2020)   PRAPARE - Hydrologist (Medical): No    Lack of Transportation (Non-Medical): No  Physical Activity: Inactive (05/08/2020)   Exercise Vital Sign    Days of Exercise per Week: 0 days    Minutes of Exercise per Session: 0 min  Stress: No Stress Concern Present (05/08/2020)   [place]    Feeling of Stress : Only a little  Social Connections: Moderately Isolated (05/08/2020)   Social Connection and Isolation Panel [NHANES]    Frequency of Communication with Friends and Family: Once a week    Frequency of Social Gatherings with Friends and Family: Once a week    Attends Religious Services: More than 4 times per year    Active Member of [employer] or Organizations: No    Attends Music therapist: Never    Marital Status: Married     Family History: The patient's family history includes Allergic rhinitis in her brother and father; Cancer in her father; Hypertension in her father and mother; Rheum arthritis in her sister. There is no history of Colon cancer, Celiac disease, Crohn's disease, or Colon polyps.  ROS:   Please see the history of present illness.     All other systems reviewed and are negative.  EKGs/Labs/Other Studies Reviewed:    The following studies were reviewed today:  ECHO 10/04/19:  Summary    1. The left ventricle is normal in size with normal wall thickness.    2. The left ventricular systolic function is overall normal, LVEF is  visually estimated at 50-55%.    3. The right ventricle is normal in size, with normal systolic function.    Stress test 10/2019:  Stress Findings  - The patient exercised for 5 minutes 13 seconds of a Bruce protocol reaching into stage 2 with a  peak heart rate of 157 (90% of PMHR) achieving 7.0 METS .  - The patient reported dyspnea during the stress test.   - The stress was stopped due to target heart rate achieved and fatigue.  - Overall, the patient's exercise capacity was good. Blood pressure demonstrated a normal response to exercise. Heart rate demonstrated a normal response to exercise.   Baseline ECG  - Baseline ECG shows normal sinus rhythm and normal axis.   Stress ECG  - No significant ST segment changes or arrhythmias were noted during stress.  EKG:  EKG is  ordered today.  The ekg ordered today demonstrates sinus rhythm 84, PVCs, nonspecific T wave flattening.  Recent Labs: 01/18/2021: BUN 17; Creatinine, Ser 0.68; Potassium 3.4; Sodium 138 06/10/2021: Hemoglobin 12.7; Platelets 288  Recent Lipid Panel No results found for: "CHOL", "TRIG", "HDL", "CHOLHDL", "VLDL", "LDLCALC", "LDLDIRECT"   Risk Assessment/Calculations:              Physical Exam:    VS:  BP 100/78   Pulse 84   Ht '5\' 5"'$  (1.651 m)   Wt 207 lb (93.9 kg)   LMP 01/22/2021 Comment: Pregnancy test negative on 01/18/2021  SpO2 98%   BMI 34.45 kg/m     Wt Readings from Last 3 Encounters:  10/31/21 207 lb (93.9 kg)  06/07/21 206 lb 12.8 oz (93.8 kg)  03/01/21 207 lb 3.2 oz (94 kg)     GEN:  Well nourished, well developed in no acute distress HEENT: Normal NECK: No JVD; No carotid bruits LYMPHATICS: No lymphadenopathy CARDIAC: Rare ectopy noted RRR, no murmurs, no rubs, gallops RESPIRATORY:  Clear to auscultation without rales, wheezing or rhonchi  ABDOMEN: Soft, non-tender, non-distended MUSCULOSKELETAL:  No edema; No deformity  SKIN: Warm and dry NEUROLOGIC:  Alert and oriented x 3 PSYCHIATRIC:  Normal affect   ASSESSMENT:    1. Palpitations   2. PVC's (premature ventricular contractions)    PLAN:    In order of problems listed above:  Palpitations - Has had a prior history of tachycardia and has taken Toprol-XL 25 mg once  a day. -We will check a Zio patch monitor to ensure that there are no adverse arrhythmias present.  She was concerned about atrial fibrillation after seeing a commercial on TV.  I reassured her that what we are seeing currently or PVCs only. - Her outside referral EKG shows an isolated PVC.  This could certainly be the reason for her palpitations and symptoms. -I would be fine with her tapering off her metoprolol and using it on an as-needed basis.  It does not seem to be very effective for her in controlling her PVCs. -After the monitor, and after a trial of continued conservative management, decreasing caffeine for instance (she does enjoy green tea) we can consider use of antiarrhythmic such as Multaq or flecainide.  I did briefly discuss with her PVC ablation with electrophysiology. -Thankfully she has not had any high risk symptoms such as syncope  Hypotension - Occasional hypotension is noted.  We will take her off of her metoprolol and see if this helps.        Medication Adjustments/Labs and Tests Ordered: Current medicines are reviewed at length with the patient today.  Concerns regarding medicines are outlined above.  Orders Placed This Encounter  Procedures   LONG TERM MONITOR (3-14 DAYS)   EKG 12-Lead   No orders of the defined types were placed in this encounter.   Patient Instructions  Medication Instructions:  You may take your Metoprolol on an as needed basis. Continue all other medications as listed.  *If you need a refill on your cardiac medications before your next appointment, please call your pharmacy*  Testing/Procedures: [place] Monitor Instructions  Your physician has requested you wear a ZIO patch monitor for 14 days.  This is a single patch monitor. [employer] supplies one patch monitor per enrollment. Additional stickers are not available. Please do not apply patch if you will be having a Nuclear Stress Test,  Echocardiogram, Cardiac CT, MRI, or  Chest Xray during  the period you would be wearing the  monitor. The patch cannot be worn during these tests. You cannot remove and re-apply the  ZIO XT patch monitor.  Your ZIO patch monitor will be mailed 3 day USPS to your address on file. It may take 3-5 days  to receive your monitor after you have been enrolled.  Once you have received your monitor, please review the enclosed instructions. Your monitor  has already been registered assigning a specific monitor serial # to you.  Billing and Patient Assistance Program Information  We have supplied [employer] with any of your insurance information on file for billing purposes. [employer] offers a sliding scale Patient Assistance Program for patients that do not have  insurance, or whose insurance does not completely cover the cost of the ZIO monitor.  You must apply for the Patient Assistance Program to qualify for this discounted rate.  To apply, please call [employer] at [phone], select option 4, select option 2, ask to apply for  Patient Assistance Program. Theodore Demark will ask your household income, and how many people  are in your household. They will quote your out-of-pocket cost based on that information.  [employer] will also be able to set up a 75-month interest-free payment plan if needed.  Applying the monitor   Shave hair from upper left chest.  Hold abrader disc by orange tab. Rub abrader in 40 strokes over the upper left chest as  indicated in your monitor instructions.  Clean area with 4 enclosed alcohol pads. Let dry.  Apply patch as indicated in monitor instructions. Patch will be placed under collarbone on left  side of chest with arrow pointing upward.  Rub patch adhesive wings for 2 minutes. Remove white label marked "1". Remove the white  label marked "2". Rub patch adhesive wings for 2 additional minutes.  While looking in a mirror, press and release button in center of patch. A small green light will  flash 3-4 times. This  will be your only indicator that the monitor has been turned on.  Do not shower for the first 24 hours. You may shower after the first 24 hours.  Press the button if you feel a symptom. You will hear a small click. Record Date, Time and  Symptom in the Patient Logbook.  When you are ready to remove the patch, follow instructions on the last 2 pages of Patient  Logbook. Stick patch monitor onto the last page of Patient Logbook.  Place Patient Logbook in the blue and white box. Use locking tab on box and tape box closed  securely. The blue and white box has prepaid postage on it. Please place it in the mailbox as  soon as possible. Your physician should have your test results approximately 7 days after the  monitor [facility]  Call [place]at [phone] you have questions regarding  your ZIO XT patch monitor. Call them immediately if you see an orange light blinking on your  monitor.  If your monitor falls off in less than 4 days, contact our Monitor department at [phone]  If your monitor becomes loose or falls off after 4 days call [employer] at [phone]  suggestions on securing your monitor  Follow-Up: [facility] and your [facility] needs are our [facility].  As part of our continuing mission to provide you with exceptional heart care, we have created designated Provider Care Teams.  These Care Teams include your primary Cardiologist (  physician) and Advanced Practice Providers (APPs -  Physician Assistants and Nurse Practitioners) who all work together to provide you with the care you need, when you need it.  We recommend signing up for the patient portal called "MyChart".  Sign up information is provided on this After Visit Summary.  MyChart is used to connect with patients for Virtual Visits (Telemedicine).  Patients are able to view lab/test results, encounter notes, upcoming appointments, etc.  Non-urgent messages can  be sent to your provider as well.   To learn more about what you can do with MyChart, go to [email or web address].    Your next appointment:   Follow up will be based on the results of the above testing.   Important Information About Sugar         Signed, Candee Furbish, MD  10/31/2021 10:34 AM    [facility]

## 2021-10-31 NOTE — Patient Instructions (Signed)
Medication Instructions:  You may take your Metoprolol on an as needed basis. Continue all other medications as listed.  *If you need a refill on your cardiac medications before your next appointment, please call your pharmacy*  Testing/Procedures: [place] Monitor Instructions  Your physician has requested you wear a ZIO patch monitor for 14 days.  This is a single patch monitor. [employer] supplies one patch monitor per enrollment. Additional stickers are not available. Please do not apply patch if you will be having a Nuclear Stress Test,  Echocardiogram, Cardiac CT, MRI, or Chest Xray during the period you would be wearing the  monitor. The patch cannot be worn during these tests. You cannot remove and re-apply the  ZIO XT patch monitor.  Your ZIO patch monitor will be mailed 3 day USPS to your address on file. It may take 3-5 days  to receive your monitor after you have been enrolled.  Once you have received your monitor, please review the enclosed instructions. Your monitor  has already been registered assigning a specific monitor serial # to you.  Billing and Patient Assistance Program Information  We have supplied [employer] with any of your insurance information on file for billing purposes. [employer] offers a sliding scale Patient Assistance Program for patients that do not have  insurance, or whose insurance does not completely cover the cost of the ZIO monitor.  You must apply for the Patient Assistance Program to qualify for this discounted rate.  To apply, please call [employer] at [phone], select option 4, select option 2, ask to apply for  Patient Assistance Program. Theodore Demark will ask your household income, and how many people  are in your household. They will quote your out-of-pocket cost based on that information.  [employer] will also be able to set up a 29-month interest-free payment plan if needed.  Applying the monitor   Shave hair from upper left chest.  Hold  abrader disc by orange tab. Rub abrader in 40 strokes over the upper left chest as  indicated in your monitor instructions.  Clean area with 4 enclosed alcohol pads. Let dry.  Apply patch as indicated in monitor instructions. Patch will be placed under collarbone on left  side of chest with arrow pointing upward.  Rub patch adhesive wings for 2 minutes. Remove white label marked "1". Remove the white  label marked "2". Rub patch adhesive wings for 2 additional minutes.  While looking in a mirror, press and release button in center of patch. A small green light will  flash 3-4 times. This will be your only indicator that the monitor has been turned on.  Do not shower for the first 24 hours. You may shower after the first 24 hours.  Press the button if you feel a symptom. You will hear a small click. Record Date, Time and  Symptom in the Patient Logbook.  When you are ready to remove the patch, follow instructions on the last 2 pages of Patient  Logbook. Stick patch monitor onto the last page of Patient Logbook.  Place Patient Logbook in the blue and white box. Use locking tab on box and tape box closed  securely. The blue and white box has prepaid postage on it. Please place it in the mailbox as  soon as possible. Your physician should have your test results approximately 7 days after the  monitor has been [facility]  Call [place]at [phone] you have questions regarding  your ZIO XT patch monitor. Call them immediately if you see an orange light blinking on your  monitor.  If your monitor falls off in less than 4 days, contact our Monitor department at [phone].  If your monitor becomes loose or falls off after 4 days call [employer] at [phone] for  suggestions on securing your monitor  Follow-Up: At [facility], you and your health needs are our priority.  As part of our continuing mission to provide you with exceptional heart  care, we have created designated Provider Care Teams.  These Care Teams include your primary Cardiologist (physician) and Advanced Practice Providers (APPs -  Physician Assistants and Nurse Practitioners) who all work together to provide you with the care you need, when you need it.  We recommend signing up for the patient portal called "MyChart".  Sign up information is provided on this After Visit Summary.  MyChart is used to connect with patients for Virtual Visits (Telemedicine).  Patients are able to view lab/test results, encounter notes, upcoming appointments, etc.  Non-urgent messages can be sent to your provider as well.   To learn more about what you can do with MyChart, go to [email or web address].    Your next appointment:   Follow up will be based on the results of the above testing.   Important Information About Sugar

## 2021-11-04 ENCOUNTER — Encounter: Payer: Self-pay | Admitting: *Deleted

## 2021-11-05 ENCOUNTER — Ambulatory Visit: Payer: 59 | Admitting: Psychiatry

## 2021-11-05 ENCOUNTER — Telehealth: Payer: Self-pay | Admitting: Psychiatry

## 2021-11-05 VITALS — BP 105/70 | HR 68 | Ht 65.0 in | Wt 206.0 lb

## 2021-11-05 DIAGNOSIS — G43119 Migraine with aura, intractable, without status migrainosus: Secondary | ICD-10-CM | POA: Diagnosis not present

## 2021-11-05 DIAGNOSIS — R200 Anesthesia of skin: Secondary | ICD-10-CM

## 2021-11-05 DIAGNOSIS — R519 Headache, unspecified: Secondary | ICD-10-CM | POA: Diagnosis not present

## 2021-11-05 DIAGNOSIS — M542 Cervicalgia: Secondary | ICD-10-CM | POA: Diagnosis not present

## 2021-11-05 MED ORDER — QULIPTA 60 MG PO TABS
60.0000 mg | ORAL_TABLET | Freq: Every day | ORAL | 6 refills | Status: DC
Start: 2021-11-05 — End: 2021-12-20

## 2021-11-05 MED ORDER — ZOLMITRIPTAN 5 MG PO TABS: 5.0000 mg | ORAL_TABLET | ORAL | 6 refills | Status: DC | PRN

## 2021-11-05 NOTE — Patient Instructions (Signed)
MRI brain and cervical spine  Referral to physical therapy for the neck  Start Zomig as needed for migraines  Start Qulipta 60 mg daily for migraine prevention

## 2021-11-05 NOTE — Progress Notes (Signed)
Referring:  [place], MD [place],  [place] [address]  PCP: Disautel Nation, MD  Neurology was asked to evaluate Carollyn Etcheverry, a 46 year old female for a chief complaint of headaches.  Our recommendations of care will be communicated by shared medical record.    CC:  headaches  History provided from self  HPI:  Medical co-morbidities: asthma, hypothyroidism, prediabetes, PVCs, kidney stones  The patient presents for evaluation of headaches which began in childhood but have been worsening recently. She has had headaches daily for the past weeks. No clear onset for worsening of her headaches. Headaches are described as sharp or throbbing pain from her left eye radiating to her occiput. They are associated with photophobia, phonophobia, nausea, and vomiting. Sometimes gets paresthesias on the left side of her scalp as well.   She has noticed significant neck pain, and headaches can be triggered by turning her neck L>R. Pain will radiate from her neck down her arms on both sides. Arms will go numb when she is driving and has them resting on the steering wheel. Feels her grip strength has worsened bilaterally and has been dropping things more frequently.   Lorrin Goodell and Excedrin as needed. Ubrelvy 100 mg helps some of the time and she almost always has to take a second dose. It takes several hours to start working.  She is currently undergoing workup for arrhythmias. Is planned to wear a heart monitor for the next 2 weeks.  Headache History: Onset: childhood Triggers: turning her neck L>R, salty food Aura: sparkles (rare) Location: left side, occiput, retro-orbital Quality/Description: sharp, throbbing Associated Symptoms:  Photophobia: yes  Phonophobia: yes  Nausea: yes Vomiting: yes Worse with activity?: yes Duration of headaches: up to 24 hours  Headache days per month: 21 Headache free days per month: 9  Current Treatment: Abortive Ubrelvy 100 mg  PRN Excedrin  Preventative none  Prior Therapies                                 Metoprolol Topamax - contraindicated due to kidney stones Excedrin Imitrex 100 mg PRN - helped but caused neck and jaw pain Maxalt - lack of efficacy Ubrelvy 100 mg PRN  LABS: CBC    Component Value Date/Time   WBC 10.0 06/10/2021 1501   WBC 9.3 01/18/2021 1038   RBC 3.99 06/10/2021 1501   RBC 4.37 01/18/2021 1038   HGB 12.7 06/10/2021 1501   HCT 36.5 06/10/2021 1501   PLT 288 06/10/2021 1501   MCV 92 06/10/2021 1501   MCH 31.8 06/10/2021 1501   MCH 31.8 01/18/2021 1038   MCHC 34.8 06/10/2021 1501   MCHC 33.6 01/18/2021 1038   RDW 12.4 06/10/2021 1501   LYMPHSABS 2.3 06/10/2021 1501   EOSABS 0.2 06/10/2021 1501   BASOSABS 0.1 06/10/2021 1501      Latest Ref Rng & Units 01/18/2021   10:38 AM  CMP  Glucose 70 - 99 mg/dL 83   BUN 6 - 20 mg/dL 17   Creatinine 0.44 - 1.00 mg/dL 0.68   Sodium 135 - 145 mmol/L 138   Potassium 3.5 - 5.1 mmol/L 3.4   Chloride 98 - 111 mmol/L 107   CO2 22 - 32 mmol/L 23   Calcium 8.9 - 10.3 mg/dL 8.8      IMAGING:  none   Current Outpatient Medications on File Prior to Visit  Medication  Sig Dispense Refill   albuterol (VENTOLIN HFA) 108 (90 Base) MCG/ACT inhaler Inhale 2 puffs into the lungs every 6 (six) hours as needed for wheezing or shortness of breath. 18 g 1   Azelastine-Fluticasone 137-50 MCG/ACT SUSP Place 1 spray into the nose 2 (two) times daily. (Patient taking differently: Place 2 sprays into the nose daily.) 23 g 5   fluticasone-salmeterol (ADVAIR) 500-50 MCG/ACT AEPB Inhale 1 puff into the lungs in the morning and at bedtime. 60 each 5   ipratropium (ATROVENT) 0.03 % nasal spray 1 spray per nostril 2-3 times daily as needed. 30 mL 5   levocetirizine (XYZAL) 5 MG tablet Take 1 tablet (5 mg total) by mouth every evening. 30 tablet 5   levothyroxine (SYNTHROID) 75 MCG tablet Take 75 mcg by mouth daily before breakfast.     montelukast  (SINGULAIR) 10 MG tablet Take 10 mg by mouth at bedtime.     omeprazole (PRILOSEC) 40 MG capsule Take 40 mg by mouth daily.     oxybutynin (DITROPAN-XL) 10 MG 24 hr tablet Take 10 mg by mouth every evening.     UBRELVY 100 MG TABS Take by mouth as needed for migraine.     metoprolol succinate (TOPROL-XL) 25 MG 24 hr tablet Take 25 mg by mouth as needed. (Patient not taking: Reported on 11/05/2021)     No current facility-administered medications on file prior to visit.     Allergies: Allergies  Allergen Reactions   Bee Pollen Cough, Other (See Comments) and Shortness Of Breath   Short Ragweed Pollen Ext Cough, Other (See Comments) and Shortness Of Breath    Family History: Migraine or other headaches in the family:  mother, 3 sisters Aneurysms in a first degree relative:  no Brain tumors in the family:  no Other neurological illness in the family:   sister has Chiari malformation  Past Medical History: Past Medical History:  Diagnosis Date   Allergies    Asthma    Dysrhythmia    GERD (gastroesophageal reflux disease)    History of kidney stones    Hx of migraine headaches    Hypothyroid    Overactive bladder    Palpitations     Past Surgical History Past Surgical History:  Procedure Laterality Date   CHOLECYSTECTOMY  2016   ESOPHAGOGASTRODUODENOSCOPY N/A 04/30/2018   normal. Empiric dilatation   MALONEY DILATION N/A 04/30/2018   Procedure: Venia Minks DILATION;  Surgeon: Daneil Dolin, MD;  Location: [facility];  Service: Endoscopy;  Laterality: N/A;   TUBAL LIGATION     VAGINAL HYSTERECTOMY N/A 01/22/2021   Procedure: HYSTERECTOMY VAGINAL;  Surgeon: Janyth Pupa, DO;  Location: [facility];  Service: Gynecology;  Laterality: N/A;   WISDOM TOOTH EXTRACTION      Social History: Social History   Tobacco Use   Smoking status: Former    Types: Cigarettes    Quit date: 2009    Years since quitting: 14.6   Smokeless tobacco: Never  Vaping Use   Vaping Use: Never used   Substance Use Topics   Alcohol use: No   Drug use: No   10/02/21 TSH 5.852, A1c 5.9  ROS: Negative for fevers, chills. Positive for headaches, neck pain. All other systems reviewed and negative unless stated otherwise in HPI.   Physical Exam:   Vital Signs: BP 105/70   Pulse 68   Ht '5\' 5"'$  (1.651 m)   Wt 206 lb (93.4 kg)   LMP 01/22/2021 Comment: Pregnancy test negative  on 01/18/2021  BMI 34.28 kg/m  GENERAL: well appearing,in no acute distress,alert SKIN:  Color, texture, turgor normal. No rashes or lesions HEAD:  Normocephalic/atraumatic. CV:  RRR RESP: Normal respiratory effort MSK: +tenderness to palpation over bilateral occiput, neck, and shoulders. Limited cervical range of motion turning head left and right  NEUROLOGICAL: Mental Status: Alert, oriented to person, place and time,Follows commands Cranial Nerves: PERRL, visual fields intact to confrontation, extraocular movements intact, facial sensation intact, no facial droop or ptosis, hearing grossly intact, no dysarthria, palate elevate symmetrically Motor: mildly decreased grip strength on left, muscle strength 5/5 both upper and lower extremities Reflexes: 2+ throughout Sensation: intact to light touch all 4 extremities Coordination: Finger-to- nose-finger intact bilaterally Gait: normal-based   IMPRESSION: 46 year old female with a history of asthma, hypothyroidism, prediabetes, PVCs, kidney stones who presents for evaluation of headaches and neck pain. MRI brain ordered for worsening headaches. Will also check MRI C-spine as she reports radicular pain, numbness, and weakness of bilateral upper extremities. Will try Zomig for rescue, which she can alternate with Roselyn Meier if needed. She has failed beta blockers and cannot take Topamax due to kidney stones. Would avoid antidepressants while she is undergoing arrhythmia workup. She would prefer to avoid injections if possible. Will start Qulipta for migraine  prevention.  PLAN: -MRI brain, C-spine -Prevention: Start Qulipta 60 mg daily -Rescue: Start Zomig 5 mg PRN, continue Ubrelvy 100 mg PRN -Referral to neck PT   I spent a total of 41 minutes chart reviewing and counseling the patient. Headache education was done. Discussed treatment options including preventive and acute medications. Discussed medication side effects, adverse reactions and drug interactions. Written educational materials and patient instructions outlining all of the above were given.  Follow-up: 3 months   Genia Harold, MD 11/05/2021   12:03 PM

## 2021-11-05 NOTE — Telephone Encounter (Signed)
[employer] sent to GI they obtain auth 

## 2021-11-06 DIAGNOSIS — Z6833 Body mass index (BMI) 33.0-33.9, adult: Secondary | ICD-10-CM | POA: Diagnosis not present

## 2021-11-06 DIAGNOSIS — E871 Hypo-osmolality and hyponatremia: Secondary | ICD-10-CM | POA: Diagnosis not present

## 2021-11-06 DIAGNOSIS — M549 Dorsalgia, unspecified: Secondary | ICD-10-CM | POA: Diagnosis not present

## 2021-11-06 DIAGNOSIS — R030 Elevated blood-pressure reading, without diagnosis of hypertension: Secondary | ICD-10-CM | POA: Diagnosis not present

## 2021-11-06 DIAGNOSIS — R002 Palpitations: Secondary | ICD-10-CM | POA: Diagnosis not present

## 2021-11-06 DIAGNOSIS — E039 Hypothyroidism, unspecified: Secondary | ICD-10-CM | POA: Diagnosis not present

## 2021-11-06 DIAGNOSIS — M542 Cervicalgia: Secondary | ICD-10-CM | POA: Diagnosis not present

## 2021-11-06 DIAGNOSIS — Z8679 Personal history of other diseases of the circulatory system: Secondary | ICD-10-CM | POA: Diagnosis not present

## 2021-11-06 DIAGNOSIS — Z8669 Personal history of other diseases of the nervous system and sense organs: Secondary | ICD-10-CM | POA: Diagnosis not present

## 2021-11-07 ENCOUNTER — Encounter: Payer: Self-pay | Admitting: Psychiatry

## 2021-11-07 ENCOUNTER — Other Ambulatory Visit (HOSPITAL_COMMUNITY): Payer: Self-pay | Admitting: Internal Medicine

## 2021-11-07 ENCOUNTER — Telehealth: Payer: Self-pay

## 2021-11-07 DIAGNOSIS — Z1231 Encounter for screening mammogram for malignant neoplasm of breast: Secondary | ICD-10-CM

## 2021-11-07 MED ORDER — AIMOVIG 70 MG/ML ~~LOC~~ SOAJ: 70.0000 mg | SUBCUTANEOUS | 6 refills | Status: DC

## 2021-11-07 NOTE — Telephone Encounter (Addendum)
Called patient and advised her insurance denied. She can try Aimovig if she is willing to do a monthly injection. If she still wants to avoid injections Dr Billey Gosling could do a low dose of Cymbalta instead. She would like to try Aimovig. She is aware it will require a PA. Sent to MD for Rx.

## 2021-11-07 NOTE — Telephone Encounter (Signed)
Qulipta '60MG'$  tablets PA submitted via CMM.  [employer] - PA Case ID: [id number] - Rx #: [id number] Your information has been submitted to [facility]. If [employer] has not responded to your request within 24 hours, contact [facility] at [phone].  Awaiting determination

## 2021-11-07 NOTE — Telephone Encounter (Signed)
Rx sent, thanks 

## 2021-11-07 NOTE — Telephone Encounter (Signed)
Qulipta denied. The policy states that this medication may be approved when: -The member has a clinical condition or needs a specific dosage form for which there is no alternative on the formulary OR -The listed formulary alternatives are not recommended based on published guidelines or clinical literature OR -The formulary alternatives will likely be ineffective or less effective for the member OR -The formulary alternatives will likely cause an adverse effect OR -The member is unable to take the required number of formulary alternatives for the given diagnosis due to a trial and inadequate treatment response or contraindication OR -The member has tried and failed the required number of formulary alternatives. Based on the policy and the information we have, your request is denied. We did not receive any documentation that you meet any of the criteria outlined above. Formulary alternative(s) are Aimovig. Requirement: 3 in a class with 3 or more alternatives, 2 in a class with 2 alternatives, or 1 in a class with only 1 alternative

## 2021-11-07 NOTE — Telephone Encounter (Signed)
We can try Aimovig if she is willing to do a monthly injection. If she still wants to avoid injections we could do a low dose of Cymbalta instead

## 2021-11-07 NOTE — Addendum Note (Signed)
Addended by: Genia Harold on: 11/07/2021 01:36 PM   Modules accepted: Orders

## 2021-11-14 ENCOUNTER — Encounter: Payer: Self-pay | Admitting: *Deleted

## 2021-11-14 MED ORDER — PEG 3350-KCL-NA BICARB-NACL 420 G PO SOLR: 4000.0000 mL | Freq: Once | ORAL | 0 refills | Status: AC

## 2021-11-14 NOTE — Progress Notes (Signed)
Spoke with pt. Scheduled for 9/19 at 10:30am. Aware will mail instructions. Rx for prep sent to pharmacy

## 2021-11-21 ENCOUNTER — Other Ambulatory Visit: Payer: 59

## 2021-11-21 ENCOUNTER — Encounter: Payer: Self-pay | Admitting: *Deleted

## 2021-11-21 NOTE — Progress Notes (Signed)
  Referral created in Epic TCS apt note sent to PCP

## 2021-11-25 DIAGNOSIS — R002 Palpitations: Secondary | ICD-10-CM | POA: Diagnosis not present

## 2021-11-27 ENCOUNTER — Other Ambulatory Visit: Payer: Self-pay | Admitting: Psychiatry

## 2021-11-27 ENCOUNTER — Encounter (HOSPITAL_COMMUNITY): Payer: Self-pay

## 2021-11-27 ENCOUNTER — Other Ambulatory Visit: Payer: Self-pay

## 2021-11-28 ENCOUNTER — Encounter (HOSPITAL_COMMUNITY)
Admission: RE | Admit: 2021-11-28 | Discharge: 2021-11-28 | Disposition: A | Discharge status: Home / Self Care | Payer: 59 | Source: Ambulatory Visit | Attending: Internal Medicine | Admitting: Internal Medicine

## 2021-12-03 ENCOUNTER — Ambulatory Visit (HOSPITAL_COMMUNITY)
Admission: RE | Admit: 2021-12-03 | Discharge: 2021-12-03 | Disposition: A | Discharge status: Home / Self Care | Payer: 59 | Attending: Internal Medicine | Admitting: Internal Medicine

## 2021-12-03 ENCOUNTER — Other Ambulatory Visit: Payer: Self-pay

## 2021-12-03 ENCOUNTER — Encounter (HOSPITAL_COMMUNITY): Payer: Self-pay

## 2021-12-03 ENCOUNTER — Telehealth: Payer: Self-pay | Admitting: *Deleted

## 2021-12-03 ENCOUNTER — Ambulatory Visit (HOSPITAL_COMMUNITY): Payer: 59 | Admitting: Anesthesiology

## 2021-12-03 ENCOUNTER — Ambulatory Visit (HOSPITAL_BASED_OUTPATIENT_CLINIC_OR_DEPARTMENT_OTHER): Payer: 59 | Admitting: Anesthesiology

## 2021-12-03 ENCOUNTER — Encounter (HOSPITAL_COMMUNITY)
Admission: RE | Disposition: A | Discharge status: Home / Self Care | Payer: Self-pay | Source: Home / Self Care | Attending: Internal Medicine

## 2021-12-03 DIAGNOSIS — Z139 Encounter for screening, unspecified: Secondary | ICD-10-CM | POA: Diagnosis not present

## 2021-12-03 DIAGNOSIS — Z1211 Encounter for screening for malignant neoplasm of colon: Secondary | ICD-10-CM

## 2021-12-03 DIAGNOSIS — K5730 Diverticulosis of large intestine without perforation or abscess without bleeding: Secondary | ICD-10-CM

## 2021-12-03 DIAGNOSIS — K635 Polyp of colon: Secondary | ICD-10-CM | POA: Insufficient documentation

## 2021-12-03 DIAGNOSIS — K648 Other hemorrhoids: Secondary | ICD-10-CM | POA: Diagnosis not present

## 2021-12-03 DIAGNOSIS — Z1212 Encounter for screening for malignant neoplasm of rectum: Secondary | ICD-10-CM

## 2021-12-03 DIAGNOSIS — J45909 Unspecified asthma, uncomplicated: Secondary | ICD-10-CM | POA: Diagnosis not present

## 2021-12-03 DIAGNOSIS — E039 Hypothyroidism, unspecified: Secondary | ICD-10-CM | POA: Insufficient documentation

## 2021-12-03 DIAGNOSIS — Z87891 Personal history of nicotine dependence: Secondary | ICD-10-CM | POA: Insufficient documentation

## 2021-12-03 DIAGNOSIS — K219 Gastro-esophageal reflux disease without esophagitis: Secondary | ICD-10-CM | POA: Diagnosis not present

## 2021-12-03 HISTORY — PX: POLYPECTOMY: SHX5525

## 2021-12-03 HISTORY — PX: COLONOSCOPY WITH PROPOFOL: SHX5780

## 2021-12-03 SURGERY — COLONOSCOPY WITH PROPOFOL
Anesthesia: General

## 2021-12-03 MED ORDER — PROPOFOL 500 MG/50ML IV EMUL
INTRAVENOUS | Status: DC | PRN
  Administered 2021-12-03: 200 ug/kg/min via INTRAVENOUS

## 2021-12-03 MED ORDER — LACTATED RINGERS IV SOLN: INTRAVENOUS | Status: DC

## 2021-12-03 MED ORDER — FLECAINIDE ACETATE 50 MG PO TABS: 50.0000 mg | ORAL_TABLET | Freq: Two times a day (BID) | ORAL | 3 refills | Status: DC

## 2021-12-03 MED ORDER — LIDOCAINE HCL (CARDIAC) PF 100 MG/5ML IV SOSY
PREFILLED_SYRINGE | INTRAVENOUS | Status: DC | PRN
  Administered 2021-12-03: 50 mg via INTRATRACHEAL

## 2021-12-03 MED ORDER — PROPOFOL 10 MG/ML IV BOLUS
INTRAVENOUS | Status: DC | PRN
  Administered 2021-12-03: 100 mg via INTRAVENOUS

## 2021-12-03 NOTE — Transfer of Care (Signed)
Immediate Anesthesia Transfer of Care Note  Patient: Kendra Soto  Procedure(s) Performed: COLONOSCOPY WITH PROPOFOL POLYPECTOMY  Patient Location: Short Stay  Anesthesia Type:General  Level of Consciousness: awake, alert , oriented and patient cooperative  Airway & Oxygen Therapy: Patient Spontanous Breathing  Post-op Assessment: Report given to RN, Post -op Vital signs reviewed and stable and Patient moving all extremities  Post vital signs: Reviewed and stable  Last Vitals:  Vitals Value Taken Time  BP    Temp    Pulse    Resp    SpO2      Last Pain:  Vitals:   12/03/21 1033  TempSrc:   PainSc: 4       Patients Stated Pain Goal: 6 (01/11/24 3664)  Complications: No notable events documented.

## 2021-12-03 NOTE — Telephone Encounter (Signed)
Pt aware of results and orders to start Flecainide 50 mg BID - EKG in 2 weeks.  Pt agreeable.  RX sent into [facility] as requested.  Appt scheduled with Dr Marlou Porch 10/6 for f/u with EKG.  Pt will call back if any questions or concerns

## 2021-12-03 NOTE — Telephone Encounter (Signed)
-----   Message from Jerline Pain, MD sent at 12/03/2021  2:34 PM EDT -----   Sinus rhythm heart rate average 85 bpm.   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   No atrial fibrillation present  Given the frequency of PVCs, and no significant response to Toprol-XL 25 mg, lets start flecainide 50 mg twice a day for suppression.  Lets have her come back in 2 weeks for an EKG and follow-up with me.  Candee Furbish, MD

## 2021-12-03 NOTE — Op Note (Signed)
[facility] Patient Name: Kendra Soto Procedure Date: 12/03/2021 10:25 AM MRN: [id number] Date of Birth: 17-Nov-1975 Attending MD: Elon Alas. Abbey Chatters DO CSN: [id number] Age: 46 Admit Type: Outpatient Procedure:                Colonoscopy Indications:              Screening for colorectal malignant neoplasm Providers:                Elon Alas. Abbey Chatters, DO, Lambert Mody, Caprice Kluver, Ladoris Gene Technician, Technician Referring MD:              Medicines:                See the Anesthesia note for documentation of the                            administered medications Complications:            No immediate complications. Estimated Blood Loss:     Estimated blood loss was minimal. Procedure:                Pre-Anesthesia Assessment:                           - The anesthesia plan was to use monitored                            anesthesia care (MAC).                           After obtaining informed consent, the colonoscope                            was passed under direct vision. Throughout the                            procedure, the patient's blood pressure, pulse, and                            oxygen saturations were monitored continuously. The                            [id number]-HQ190L ([id number]) scope was introduced through                            the anus and advanced to the the cecum, identified                            by appendiceal orifice and ileocecal valve. The                            colonoscopy was performed without difficulty. The                            patient tolerated the procedure  well. The quality                            of the bowel preparation was evaluated using the                            BBPS [facility] Bowel Preparation Scale) with scores                            of: Right Colon = 3, Transverse Colon = 3 and Left                            Colon = 3 (entire mucosa seen well with no residual                             staining, small fragments of stool or opaque                            liquid). The total BBPS score equals 9. Scope In: 10:37:22 AM Scope Out: 10:49:17 AM Scope Withdrawal Time: 0 hours 8 minutes 59 seconds  Total Procedure Duration: 0 hours 11 minutes 55 seconds  Findings:      The perianal and digital rectal examinations were normal.      Non-bleeding internal hemorrhoids were found during endoscopy.      Multiple small-mouthed diverticula were found in the sigmoid colon.      A 4 mm polyp was found in the sigmoid colon. The polyp was sessile. The       polyp was removed with a cold snare. Resection and retrieval were       complete. Impression:               - Non-bleeding internal hemorrhoids.                           - Diverticulosis in the sigmoid colon.                           - One 4 mm polyp in the sigmoid colon, removed with                            a cold snare. Resected and retrieved. Moderate Sedation:      Per Anesthesia Care Recommendation:           - Patient has a contact number available for                            emergencies. The signs and symptoms of potential                            delayed complications were discussed with the                            patient. Return to normal activities tomorrow.  Written discharge instructions were provided to the                            patient.                           - Resume previous diet.                           - Continue present medications.                           - Await pathology results.                           - Repeat colonoscopy in 10 years for screening                            purposes.                           - Return to GI clinic PRN. Procedure Code(s):        --- Professional ---                           [phone], Colonoscopy, flexible; with removal of                            tumor(s), polyp(s), or other lesion(s) by snare                             technique Diagnosis Code(s):        --- Professional ---                           Z12.11, Encounter for screening for malignant                            neoplasm of colon                           K64.8, Other hemorrhoids                           K63.5, Polyp of colon                           K57.30, Diverticulosis of large intestine without                            perforation or abscess without bleeding CPT copyright 2019 American Medical Association. All rights reserved. The codes documented in this report are preliminary and upon coder review may  be revised to meet current compliance requirements. Elon Alas. Abbey Chatters, DO [place] Abbey Chatters, DO 12/03/2021 10:50:58 AM This report has been signed electronically. Number of Addenda: 0

## 2021-12-03 NOTE — Anesthesia Postprocedure Evaluation (Signed)
Anesthesia Post Note  Patient: Kendra Soto  Procedure(s) Performed: COLONOSCOPY WITH PROPOFOL POLYPECTOMY  Patient location during evaluation: Phase II Anesthesia Type: General Level of consciousness: awake Pain management: pain level controlled Vital Signs Assessment: post-procedure vital signs reviewed and stable Respiratory status: spontaneous breathing and respiratory function stable Cardiovascular status: blood pressure returned to baseline and stable Postop Assessment: no headache and no apparent nausea or vomiting Anesthetic complications: no Comments: Late entry   No notable events documented.   Last Vitals:  Vitals:   12/03/21 1051 12/03/21 1054  BP: 101/66 103/64  Pulse: 86 84  Resp: 18 16  Temp: 36.6 C   SpO2: 99% 99%    Last Pain:  Vitals:   12/03/21 1054  TempSrc:   PainSc: 0-No pain                 Louann Sjogren

## 2021-12-03 NOTE — H&P (Signed)
Primary Care Physician:  Ludlow Nation, MD Primary Gastroenterologist:  Dr. Abbey Chatters  Pre-Procedure History & Physical: HPI:  Kendra Soto is a 46 y.o. female is here for first ever colonoscopy for colon cancer screening purposes.  Patient denies any family history of colorectal cancer.  No melena or hematochezia.  No abdominal pain or unintentional weight loss.  No change in bowel habits.  Overall feels well from a GI standpoint.  Past Medical History:  Diagnosis Date   Allergies    Asthma    Dysrhythmia    GERD (gastroesophageal reflux disease)    History of kidney stones    Hx of migraine headaches    Hypothyroid    Overactive bladder    Palpitations     Past Surgical History:  Procedure Laterality Date   ABDOMINAL HYSTERECTOMY     CHOLECYSTECTOMY  2016   ESOPHAGOGASTRODUODENOSCOPY N/A 04/30/2018   normal. Empiric dilatation   MALONEY DILATION N/A 04/30/2018   Procedure: Venia Minks DILATION;  Surgeon: Daneil Dolin, MD;  [facility]: [facility];  Service: Endoscopy;  Laterality: N/A;   TUBAL LIGATION     VAGINAL HYSTERECTOMY N/A 01/22/2021   Procedure: HYSTERECTOMY VAGINAL;  Surgeon: Janyth Pupa, DO;  Location: [facility];  Service: Gynecology;  Laterality: N/A;   WISDOM TOOTH EXTRACTION      Prior to Admission medications   Medication Sig Start Date End Date Taking? Authorizing Provider  albuterol (VENTOLIN HFA) 108 (90 Base) MCG/ACT inhaler Inhale 2 puffs into the lungs every 6 (six) hours as needed for wheezing or shortness of breath. 06/07/21  Yes Valentina Shaggy, MD  Azelastine-Fluticasone 137-50 MCG/ACT SUSP Place 1 spray into the nose 2 (two) times daily. Patient taking differently: Place 2 sprays into the nose at bedtime. 06/11/21  Yes Valentina Shaggy, MD  clobetasol ointment (TEMOVATE) 1.61 % Apply 1 Application topically 2 (two) times daily as needed (irritation).   Yes [provider]  Erenumab-aooe (AIMOVIG) 70 MG/ML SOAJ Inject 70 mg into the  skin every 30 (thirty) days. 11/07/21  Yes Genia Harold, MD  fluticasone 0.05%-ketoconazole 2% 1:1 cream mixture Apply 1 application  topically daily as needed for irritation.   Yes [provider]  fluticasone-salmeterol (ADVAIR) 500-50 MCG/ACT AEPB Inhale 1 puff into the lungs in the morning and at bedtime. 06/11/21  Yes Valentina Shaggy, MD  ipratropium (ATROVENT) 0.03 % nasal spray 1 spray per nostril 2-3 times daily as needed. Patient taking differently: Place 2 sprays into both nostrils every morning. 06/07/21  Yes Valentina Shaggy, MD  levocetirizine (XYZAL) 5 MG tablet Take 1 tablet (5 mg total) by mouth every evening. 06/07/21  Yes Valentina Shaggy, MD  levothyroxine (SYNTHROID) 75 MCG tablet Take 75 mcg by mouth daily before breakfast.   Yes [provider]  montelukast (SINGULAIR) 10 MG tablet Take 10 mg by mouth at bedtime. 04/12/18  Yes [provider]  omeprazole (PRILOSEC) 40 MG capsule Take 40 mg by mouth daily. 06/01/21  Yes [provider]  oxybutynin (DITROPAN-XL) 10 MG 24 hr tablet Take 10 mg by mouth every evening. 12/23/20  Yes [provider]  UBRELVY 100 MG TABS Take 100 mg by mouth daily as needed for migraine. 10/30/21  Yes [provider]  zolmitriptan (ZOMIG) 5 MG tablet Take 1 tablet (5 mg total) by mouth as needed for migraine. May repeat a dose in 2 hours if headache persists 11/05/21  Yes Chima, Anderson Malta, MD  Atogepant (QULIPTA) 60 MG TABS  Take 60 mg by mouth daily. Patient not taking: Reported on 11/21/2021 11/05/21   Genia Harold, MD    Allergies as of 11/14/2021 - Review Complete 11/05/2021  Allergen Reaction Noted   Bee pollen Cough, Other (See Comments), and Shortness Of Breath 06/07/2021   Short ragweed pollen ext Cough, Other (See Comments), and Shortness Of Breath 06/07/2021    Family History  Problem Relation Age of Onset   Hypertension Mother    Allergic rhinitis Father    Cancer Father         prostate   Hypertension Father    Rheum arthritis Sister    Allergic rhinitis Brother    Colon cancer Neg Hx    Celiac disease Neg Hx    Crohn's disease Neg Hx    Colon polyps Neg Hx     Social History   Socioeconomic History   Marital status: Married    Spouse name: Not on file   Number of children: 3   Years of education: Not on file   Highest education level: Not on file  Occupational History   Not on file  Tobacco Use   Smoking status: Former    Packs/day: 1.00    Years: 20.00    Total pack years: 20.00    Types: Cigarettes    Quit date: 2009    Years since quitting: 14.7   Smokeless tobacco: Never  Vaping Use   Vaping Use: Never used  Substance and Sexual Activity   Alcohol use: No   Drug use: No   Sexual activity: Yes    Birth control/protection: Surgical    Comment: hyst  Other Topics Concern   Not on file  Social History Narrative   Not on file   Social Determinants of Health   Financial Resource Strain: Low Risk  (05/08/2020)   Overall Financial Resource Strain (CARDIA)    Difficulty of Paying Living Expenses: Not very hard  Food Insecurity: Food Insecurity Present (05/08/2020)   Hunger Vital Sign    Worried About Running Out of Food in the Last Year: Often true    Ran Out of Food in the Last Year: Never true  Transportation Needs: No Transportation Needs (05/08/2020)   PRAPARE - Hydrologist (Medical): No    Lack of Transportation (Non-Medical): No  Physical Activity: Inactive (05/08/2020)   Exercise Vital Sign    Days of Exercise per Week: 0 days    Minutes of Exercise per Session: 0 min  Stress: No Stress Concern Present (05/08/2020)   [place]    Feeling of Stress : Only a little  Social Connections: Moderately Isolated (05/08/2020)   Social Connection and Isolation Panel [NHANES]    Frequency of Communication with Friends and Family: Once a  week    Frequency of Social Gatherings with Friends and Family: Once a week    Attends Religious Services: More than 4 times per year    Active Member of [employer] or Organizations: No    Attends Archivist Meetings: Never    Marital Status: Married  Human resources officer Violence: Not At Risk (05/08/2020)   Humiliation, Afraid, Rape, and Kick questionnaire    Fear of Current or Ex-Partner: No    Emotionally Abused: No    Physically Abused: No    Sexually Abused: No    Review of Systems: See HPI, otherwise negative ROS  Physical Exam: Vital signs in last 24 hours:  Temp:  [97.9 F (36.6 C)] 97.9 F (36.6 C) (09/19 1001) Pulse Rate:  [69] 69 (09/19 1001) Resp:  [13] 13 (09/19 1001) BP: (110)/(74) 110/74 (09/19 1001) SpO2:  [100 %] 100 % (09/19 1001)   General:   Alert,  Well-developed, well-nourished, pleasant and cooperative in NAD Head:  Normocephalic and atraumatic. Eyes:  Sclera clear, no icterus.   Conjunctiva pink. Ears:  Normal auditory acuity. Nose:  No deformity, discharge,  or lesions. Mouth:  No deformity or lesions, dentition normal. Neck:  Supple; no masses or thyromegaly. Lungs:  Clear throughout to auscultation.   No wheezes, crackles, or rhonchi. No acute distress. Heart:  Regular rate and rhythm; no murmurs, clicks, rubs,  or gallops. Abdomen:  Soft, nontender and nondistended. No masses, hepatosplenomegaly or hernias noted. Normal bowel sounds, without guarding, and without rebound.   Msk:  Symmetrical without gross deformities. Normal posture. Extremities:  Without clubbing or edema. Neurologic:  Alert and  oriented x4;  grossly normal neurologically. Skin:  Intact without significant lesions or rashes. Cervical Nodes:  No significant cervical adenopathy. Psych:  Alert and cooperative. Normal mood and affect.  Impression/Plan: Kendra Soto is here for a colonoscopy to be performed for colon cancer screening purposes.  The risks of the procedure  including infection, bleed, or perforation as well as benefits, limitations, alternatives and imponderables have been reviewed with the patient. Questions have been answered. All parties agreeable.

## 2021-12-03 NOTE — Discharge Instructions (Addendum)
  Colonoscopy Discharge Instructions  Read the instructions outlined below and refer to this sheet in the next few weeks. These discharge instructions provide you with general information on caring for yourself after you leave the hospital. Your doctor may also give you specific instructions. While your treatment has been planned according to the most current medical practices available, unavoidable complications occasionally occur.   ACTIVITY You may resume your regular activity, but move at a slower pace for the next 24 hours.  Take frequent rest periods for the next 24 hours.  Walking will help get rid of the air and reduce the bloated feeling in your belly (abdomen).  No driving for 24 hours (because of the medicine (anesthesia) used during the test).   Do not sign any important legal documents or operate any machinery for 24 hours (because of the anesthesia used during the test).  NUTRITION Drink plenty of fluids.  You may resume your normal diet as instructed by your doctor.  Begin with a light meal and progress to your normal diet. Heavy or fried foods are harder to digest and may make you feel sick to your stomach (nauseated).  Avoid alcoholic beverages for 24 hours or as instructed.  MEDICATIONS You may resume your normal medications unless your doctor tells you otherwise.  WHAT YOU CAN EXPECT TODAY Some feelings of bloating in the abdomen.  Passage of more gas than usual.  Spotting of blood in your stool or on the toilet paper.  IF YOU HAD POLYPS REMOVED DURING THE COLONOSCOPY: No aspirin products for 7 days or as instructed.  No alcohol for 7 days or as instructed.  Eat a soft diet for the next 24 hours.  FINDING OUT THE RESULTS OF YOUR TEST Not all test results are available during your visit. If your test results are not back during the visit, make an appointment with your caregiver to find out the results. Do not assume everything is normal if you have not heard from your  caregiver or the medical facility. It is important for you to follow up on all of your test results.  SEEK IMMEDIATE MEDICAL ATTENTION IF: You have more than a spotting of blood in your stool.  Your belly is swollen (abdominal distention).  You are nauseated or vomiting.  You have a temperature over 101.  You have abdominal pain or discomfort that is severe or gets worse throughout the day.   Your colonoscopy revealed 1 polyp(s) which I removed successfully. This is likely benign. Await pathology results, my office will contact you. I recommend repeating colonoscopy in 10 years for screening purposes.   You also have diverticulosis and internal hemorrhoids. I would recommend increasing fiber in your diet or adding OTC Benefiber/Metamucil. Be sure to drink at least 4 to 6 glasses of water daily. Follow-up with GI as needed.   I hope you have a great rest of your week!  Elon Alas. Abbey Chatters, D.O. Gastroenterology and Hepatology [facility]

## 2021-12-03 NOTE — Anesthesia Preprocedure Evaluation (Signed)
Anesthesia Evaluation  Patient identified by MRN, date of birth, ID band Patient awake    Reviewed: Allergy & Precautions, H&P , NPO status , Patient's Chart, lab work & pertinent test results, reviewed documented beta blocker date and time   Airway Mallampati: II  TM Distance: >3 FB Neck ROM: full    Dental no notable dental hx.    Pulmonary asthma , former smoker,    Pulmonary exam normal breath sounds clear to auscultation       Cardiovascular Exercise Tolerance: Good negative cardio ROS   Rhythm:regular Rate:Normal     Neuro/Psych negative neurological ROS  negative psych ROS   GI/Hepatic Neg liver ROS, GERD  Medicated,  Endo/Other  Hypothyroidism   Renal/GU negative Renal ROS  negative genitourinary   Musculoskeletal   Abdominal   Peds  Hematology negative hematology ROS (+)   Anesthesia Other Findings   Reproductive/Obstetrics negative OB ROS                             Anesthesia Physical Anesthesia Plan  ASA: 2  Anesthesia Plan: General   Post-op Pain Management:    Induction:   PONV Risk Score and Plan: Propofol infusion  Airway Management Planned:   Additional Equipment:   Intra-op Plan:   Post-operative Plan:   Informed Consent: I have reviewed the patients History and Physical, chart, labs and discussed the procedure including the risks, benefits and alternatives for the proposed anesthesia with the patient or authorized representative who has indicated his/her understanding and acceptance.     Dental Advisory Given  Plan Discussed with: CRNA  Anesthesia Plan Comments:         Anesthesia Quick Evaluation

## 2021-12-04 LAB — SURGICAL PATHOLOGY

## 2021-12-06 ENCOUNTER — Ambulatory Visit (HOSPITAL_COMMUNITY)
Admission: RE | Admit: 2021-12-06 | Discharge: 2021-12-06 | Disposition: A | Discharge status: Home / Self Care | Payer: 59 | Source: Ambulatory Visit | Attending: Internal Medicine | Admitting: Internal Medicine

## 2021-12-06 DIAGNOSIS — Z1231 Encounter for screening mammogram for malignant neoplasm of breast: Secondary | ICD-10-CM | POA: Insufficient documentation

## 2021-12-08 ENCOUNTER — Other Ambulatory Visit: Payer: Self-pay | Admitting: Allergy & Immunology

## 2021-12-09 ENCOUNTER — Other Ambulatory Visit (HOSPITAL_COMMUNITY): Payer: Self-pay | Admitting: Internal Medicine

## 2021-12-10 ENCOUNTER — Encounter (HOSPITAL_COMMUNITY): Payer: Self-pay | Admitting: Internal Medicine

## 2021-12-11 ENCOUNTER — Ambulatory Visit (HOSPITAL_COMMUNITY): Payer: 59 | Attending: Psychiatry | Admitting: Physical Therapy

## 2021-12-11 DIAGNOSIS — M542 Cervicalgia: Secondary | ICD-10-CM | POA: Insufficient documentation

## 2021-12-11 NOTE — Therapy (Signed)
OUTPATIENT PHYSICAL THERAPY CERVICAL EVALUATION   Patient Name: Kendra Soto MRN: [id number] DOB:January 17, 1976, 46 y.o., female Today's Date: 12/11/2021   PT End of Session - 12/11/21 1519     Visit Number 1    Number of Visits 12    Date for PT Re-Evaluation 01/22/22    Authorization Type [employer]    Progress Note Due on Visit 10    PT Start Time 1430    PT Stop Time 1515    PT Time Calculation (min) 45 min    Activity Tolerance Patient tolerated treatment well    Behavior During Therapy WFL for tasks assessed/performed             Past Medical History:  Diagnosis Date   Allergies    Asthma    Dysrhythmia    GERD (gastroesophageal reflux disease)    History of kidney stones    Hx of migraine headaches    Hypothyroid    Overactive bladder    Palpitations    Past Surgical History:  Procedure Laterality Date   ABDOMINAL HYSTERECTOMY     CHOLECYSTECTOMY  2016   COLONOSCOPY WITH PROPOFOL N/A 12/03/2021   Procedure: COLONOSCOPY WITH PROPOFOL;  Surgeon: Eloise Harman, DO;  [facility]: [facility];  Service: Endoscopy;  Laterality: N/A;  10:30am, asa 2   ESOPHAGOGASTRODUODENOSCOPY N/A 04/30/2018   normal. Empiric dilatation   MALONEY DILATION N/A 04/30/2018   Procedure: Venia Minks DILATION;  Surgeon: Daneil Dolin, MD;  [facility]: [facility];  Service: Endoscopy;  Laterality: N/A;   POLYPECTOMY  12/03/2021   Procedure: POLYPECTOMY;  Surgeon: Eloise Harman, DO;  Location: [facility];  Service: Endoscopy;;   TUBAL LIGATION     VAGINAL HYSTERECTOMY N/A 01/22/2021   Procedure: HYSTERECTOMY VAGINAL;  Surgeon: Janyth Pupa, DO;  Location: [facility];  Service: Gynecology;  Laterality: N/A;   WISDOM TOOTH EXTRACTION     Patient Active Problem List   Diagnosis Date Noted   Rheumatoid arthritis ([place]) 04/12/2020   High risk medication use 04/12/2020   Chronic RUQ pain 04/16/2018   Abdominal pain, chronic, epigastric 04/16/2018   GERD (gastroesophageal reflux disease)  04/16/2018   Esophageal dysphagia 04/16/2018    PCP: Stana Bunting MD  REFERRING PROVIDER: Genia Harold, MD   REFERRING DIAG: M54.2 (ICD-10-CM) - Cervicalgia   THERAPY DIAG:  Cervicalgia  Rationale for Evaluation and Treatment Rehabilitation  ONSET DATE: 7-8 years   SUBJECTIVE:  SUBJECTIVE STATEMENT: Patein presents to physical therapy with complaint of chronic neck pain. Patient states pain has progressively worsened over the past 8 years. She also notes increased headaches, especially when head is turned. She manages pain with prescription medication.   PERTINENT HISTORY:  NA  PAIN:  Are you having pain? Yes: NPRS scale: 1/10 Pain location: neck  Pain description: throbbing, aching, sharp shooting  Aggravating factors: turning head, arm movement  Relieving factors: meds, rest, quiet/ dark places   PRECAUTIONS: None  WEIGHT BEARING RESTRICTIONS No  FALLS:  Has patient fallen in last 6 months? No  LIVING ENVIRONMENT: Lives with: lives with their family and lives with their spouse Lives in: House/apartment Stairs: Yes: External: 4 steps; can reach both Has following equipment at home: None  OCCUPATION: Unemployed   PLOF: Independent  PATIENT GOALS Find out whats wrong/ stop headaches   OBJECTIVE:   DIAGNOSTIC FINDINGS:  None   PATIENT SURVEYS:  FOTO 48% function    COGNITION: Overall cognitive status: Within functional limits for tasks assessed   SENSATION: WFL  POSTURE: rounded shoulders  PALPATION: Mod TTP about RT cervical paraspinals, sub occipitals    CERVICAL ROM:   Active ROM A/PROM (deg) eval  Flexion 30  Extension 35  Right lateral flexion   Left lateral flexion   Right rotation 50  Left rotation 50   (Blank rows = not  tested)  UPPER EXTREMITY ROM: Bilateral shoulder AROM WFL  UPPER EXTREMITY MMT:  MMT Right eval Left eval  Shoulder flexion 5 5  Shoulder extension    Shoulder abduction    Shoulder adduction    Shoulder extension    Shoulder internal rotation 5 5  Shoulder external rotation 5 5  Middle trapezius    Lower trapezius    Elbow flexion    Elbow extension    Wrist flexion    Wrist extension    Wrist ulnar deviation    Wrist radial deviation    Wrist pronation    Wrist supination    Grip strength     (Blank rows = not tested)    TODAY'S TREATMENT:  Eval  Scap retraction Chin tuck    PATIENT EDUCATION:  Education details: on Eval findings, POC and HEP  Person educated: Patient Education method: Explanation Education comprehension: verbalized understanding    HOME EXERCISE PROGRAM: Access Code: [email or web address] URL: [email or web address] Date: 12/11/2021 Prepared by: Josue Hector  Exercises - Seated Scapular Retraction  - 2-3 x daily - 7 x weekly - 1-2 sets - 10 reps - 5 seconds hold - Seated Passive Cervical Retraction  - 2-3 x daily - 7 x weekly - 1-2 sets - 10 reps  ASSESSMENT:  CLINICAL IMPRESSION: Patient is a 46 y.o. female who presents to physical therapy with complaint of neck pain. Patient demonstrates decreased strength, ROM restriction, reduced flexibility, increased tenderness to palpation and postural abnormalities which are likely contributing to symptoms of pain and are negatively impacting patient ability to perform ADLs. Patient will benefit from skilled physical therapy services to address these deficits to reduce pain and improve level of function with ADLs    OBJECTIVE IMPAIRMENTS decreased activity tolerance, decreased ROM, increased fascial restrictions, impaired flexibility, postural dysfunction, and pain.   ACTIVITY LIMITATIONS carrying, lifting, and reach over head  PARTICIPATION LIMITATIONS: meal prep, cleaning, laundry,  driving, shopping, community activity, and yard work  PERSONAL FACTORS Time since onset of injury/illness/exacerbation are also affecting patient's functional outcome.   REHAB POTENTIAL: Good  CLINICAL DECISION  MAKING: Stable/uncomplicated  EVALUATION COMPLEXITY: Low   GOALS: SHORT TERM GOALS: Target date: 01/01/2022  Patient will be independent with initial HEP and self-management strategies to improve functional outcomes Baseline:  Goal status: INITIAL   LONG TERM GOALS: Target date: 01/22/2022  Patient will be independent with advanced HEP and self-management strategies to improve functional outcomes Baseline:  Goal status: INITIAL  2.  Patient will improve FOTO score to predicted value to indicate improvement in functional outcomes Baseline: 48% function  Goal status: INITIAL  3.  Patient will demo improved bilateral cervical rotation by at least 10 degrees in order to improve ability to scan environment for safety and while driving. Baseline: See AROM  Goal status: INITIAL  4. Patient will report a decrease in neck pain to no more than 3/10 with turning head for improved quality of life and ability to perform UE ADLs  Baseline: 7-8/10 Goal status: INITIAL   5. Patient will report at least 50% overall improvement in subjective complaint of headaches to indicate improvement in QOL and ability to participate in ADLs.  Baseline:  Goal status: INITIAL  PLAN: PT FREQUENCY: 1-2x/week  PT DURATION: 6 weeks  PLANNED INTERVENTIONS: Therapeutic exercises, Therapeutic activity, Neuromuscular re-education, Balance training, Gait training, Patient/Family education, Joint manipulation, Joint mobilization, Stair training, Aquatic Therapy, Dry Needling, Electrical stimulation, Spinal manipulation, Spinal mobilization, Cryotherapy, Moist heat, scar mobilization, Taping, Traction, Ultrasound, Biofeedback, Ionotophoresis '4mg'$ /ml Dexamethasone, and Manual therapy.   PLAN FOR NEXT  SESSION: Manual STM. Progress postural strength and posturing. SNAGs, upper trap stretching, bands when ready   3:20 PM, 12/11/21 Josue Hector PT DPT  Physical Therapist with [facility]  [phone]

## 2021-12-12 ENCOUNTER — Other Ambulatory Visit (HOSPITAL_COMMUNITY): Payer: Self-pay | Admitting: Internal Medicine

## 2021-12-12 DIAGNOSIS — R928 Other abnormal and inconclusive findings on diagnostic imaging of breast: Secondary | ICD-10-CM

## 2021-12-17 ENCOUNTER — Ambulatory Visit (HOSPITAL_COMMUNITY): Payer: 59 | Admitting: Physical Therapy

## 2021-12-19 ENCOUNTER — Ambulatory Visit (HOSPITAL_COMMUNITY): Payer: 59 | Attending: Psychiatry

## 2021-12-19 ENCOUNTER — Encounter (HOSPITAL_COMMUNITY): Payer: Self-pay

## 2021-12-19 ENCOUNTER — Ambulatory Visit (HOSPITAL_COMMUNITY)
Admission: RE | Admit: 2021-12-19 | Discharge: 2021-12-19 | Disposition: A | Discharge status: Home / Self Care | Payer: 59 | Source: Ambulatory Visit | Attending: Internal Medicine | Admitting: Internal Medicine

## 2021-12-19 ENCOUNTER — Other Ambulatory Visit: Payer: Self-pay | Admitting: Allergy & Immunology

## 2021-12-19 DIAGNOSIS — R928 Other abnormal and inconclusive findings on diagnostic imaging of breast: Secondary | ICD-10-CM

## 2021-12-19 DIAGNOSIS — R921 Mammographic calcification found on diagnostic imaging of breast: Secondary | ICD-10-CM | POA: Diagnosis not present

## 2021-12-19 DIAGNOSIS — M542 Cervicalgia: Secondary | ICD-10-CM | POA: Insufficient documentation

## 2021-12-19 NOTE — Therapy (Signed)
OUTPATIENT PHYSICAL THERAPY CERVICAL EVALUATION   Patient Name: Kendra Soto MRN: [id number] DOB:07-13-1975, 46 y.o., female Today's Date: 12/19/2021   PT End of Session - 12/19/21 0956     Visit Number 2    Number of Visits 12    Date for PT Re-Evaluation 01/22/22    Authorization Type [employer]    Progress Note Due on Visit 10    PT Start Time 0955    PT Stop Time 1030    PT Time Calculation (min) 35 min    Activity Tolerance Patient tolerated treatment well    Behavior During Therapy [facility] for tasks assessed/performed             Past Medical History:  Diagnosis Date   Allergies    Asthma    Dysrhythmia    GERD (gastroesophageal reflux disease)    History of kidney stones    Hx of migraine headaches    Hypothyroid    Overactive bladder    Palpitations    Past Surgical History:  Procedure Laterality Date   ABDOMINAL HYSTERECTOMY     CHOLECYSTECTOMY  2016   COLONOSCOPY WITH PROPOFOL N/A 12/03/2021   Procedure: COLONOSCOPY WITH PROPOFOL;  Surgeon: Eloise Harman, DO;  [facility];  Service: Endoscopy;  Laterality: N/A;  10:30am, asa 2   ESOPHAGOGASTRODUODENOSCOPY N/A 04/30/2018   normal. Empiric dilatation   MALONEY DILATION N/A 04/30/2018   Procedure: Venia Minks DILATION;  Surgeon: Daneil Dolin, MD;  [facility];  Service: Endoscopy;  Laterality: N/A;   POLYPECTOMY  12/03/2021   Procedure: POLYPECTOMY;  Surgeon: Eloise Harman, DO;  [facility];  Service: Endoscopy;;   TUBAL LIGATION     VAGINAL HYSTERECTOMY N/A 01/22/2021   Procedure: HYSTERECTOMY VAGINAL;  Surgeon: Janyth Pupa, DO;  Location: [facility] OR[facility];  Service: Gynecology;  Laterality: N/A;   WISDOM TOOTH EXTRACTION     Patient Active Problem List   Diagnosis Date Noted   Rheumatoid arthritis ([place]) 04/12/2020   High risk medication use 04/12/2020   Chronic RUQ pain 04/16/2018   Abdominal pain, chronic, epigastric 04/16/2018   GERD (gastroesophageal reflux disease)  04/16/2018   Esophageal dysphagia 04/16/2018    PCP: Stana Bunting MD  REFERRING PROVIDER: Genia Harold, MD   REFERRING DIAG: M54.2 (ICD-10-CM) - Cervicalgia   THERAPY DIAG:  Cervicalgia  Rationale for Evaluation and Treatment Rehabilitation  ONSET DATE: 7-8 years   SUBJECTIVE:  SUBJECTIVE STATEMENT: Today she explains that at rest she is feeling okay however always has the chronic pain when moving, hard to avoid that. Especially hard and feels like she uses trunk to twist to look over shoulder driving to avoid as much pain and compensate.  PERTINENT HISTORY:  NA  PAIN:  Are you having pain? Yes: NPRS scale: 4/10 Pain location: neck  Pain description: throbbing, aching, sharp shooting  Aggravating factors: turning head, arm movement  Relieving factors: meds, rest, quiet/ dark places   PRECAUTIONS: None  WEIGHT BEARING RESTRICTIONS No  FALLS:  Has patient fallen in last 6 months? No  LIVING ENVIRONMENT: Lives with: lives with their family and lives with their spouse Lives in: House/apartment Stairs: Yes: External: 4 steps; can reach both Has following equipment at home: None  OCCUPATION: Unemployed   PLOF: Independent  PATIENT GOALS Find out whats wrong/ stop headaches   OBJECTIVE:   DIAGNOSTIC FINDINGS:  None   PATIENT SURVEYS:  FOTO 48% function    COGNITION: Overall cognitive status: Within functional limits for tasks assessed   SENSATION: WFL  POSTURE: rounded shoulders  PALPATION: Mod TTP about RT cervical paraspinals, sub occipitals    CERVICAL ROM:   Active ROM A/PROM (deg) eval  Flexion 30  Extension 35  Right lateral flexion   Left lateral flexion   Right rotation 50  Left rotation 50   (Blank rows = not tested)  UPPER  EXTREMITY ROM: Bilateral shoulder AROM WFL  UPPER EXTREMITY MMT:  MMT Right eval Left eval  Shoulder flexion 5 5  Shoulder extension    Shoulder abduction    Shoulder adduction    Shoulder extension    Shoulder internal rotation 5 5  Shoulder external rotation 5 5  Middle trapezius    Lower trapezius    Elbow flexion    Elbow extension    Wrist flexion    Wrist extension    Wrist ulnar deviation    Wrist radial deviation    Wrist pronation    Wrist supination    Grip strength     (Blank rows = not tested)    TODAY'S TREATMENT:  12/19/21 = Discussion and education on evaluation findings, review Goals, and discuss as below  There-Act = postural awareness and breathing   Manual = Supine positioning, wedge under knees, 2 pillows under head   - STW to gross C/S with focus on upper trap and suboccipitals, suboccipital release, UT lengthening   - Trial of grade III central cervical PA PIVMS x 30 reps each C7 up to C3   - PROM stretching cervical upper rotation and lateral flexion x 20 sec x 6 reps each B C/S   There-Ex/ review HEP  =    - Chin tuck x 10 reps with cue for eyes and head level   - Scap retraction x 10 reps with "no money" cue   - SNAG central with towel with chin tuck x 10 reps   - SNAG central with towel with C/S upper extension x 10 reps   - SNAG rotational with towel arms crossed for C/S upper rotation B x 10 reps    Eval  Scap retraction Chin tuck    PATIENT EDUCATION:  Education details: on Eval findings, POC and HEP  Person educated: Patient Education method: Explanation Education comprehension: verbalized understanding    HOME EXERCISE PROGRAM: Access Code: [email or web address] URL: [email or web address] Date: 12/11/2021 Prepared by: Josue Hector  Exercises - Seated Scapular Retraction  -  2-3 x daily - 7 x weekly - 1-2 sets - 10 reps - 5 seconds hold - Seated Passive Cervical Retraction  - 2-3 x daily - 7 x weekly - 1-2 sets - 10  reps  Added 12/19/21 =  - Cervical Extension AROM with Strap  - 2-3 x daily - 7 x weekly - 1-2 sets - 10 reps - Seated Assisted Cervical Rotation with Towel  - 2-3 x daily - 7 x weekly - 1-2 sets - 10 reps  ASSESSMENT:  CLINICAL IMPRESSION: Patient is a 46 y.o. female who presents to first full physical therapy treatment today.  Today's session review goals and findings with education on overall postural imbalances and tensions. Patient tolerated new trial of manual work well with supported HEP for continued upper cervical opening with SNAGs.  Demonstrated high guarding and tensions into suboccipitals mostly today.  Patient will benefit from skilled physical therapy services to address these deficits to reduce pain and improve level of function with ADLs    OBJECTIVE IMPAIRMENTS decreased activity tolerance, decreased ROM, increased fascial restrictions, impaired flexibility, postural dysfunction, and pain.   ACTIVITY LIMITATIONS carrying, lifting, and reach over head  PARTICIPATION LIMITATIONS: meal prep, cleaning, laundry, driving, shopping, community activity, and yard work  PERSONAL FACTORS Time since onset of injury/illness/exacerbation are also affecting patient's functional outcome.   REHAB POTENTIAL: Good  CLINICAL DECISION MAKING: Stable/uncomplicated  EVALUATION COMPLEXITY: Low   GOALS: SHORT TERM GOALS: Target date: 01/01/2022  Patient will be independent with initial HEP and self-management strategies to improve functional outcomes Baseline:  Goal status: IN PROGRESS   LONG TERM GOALS: Target date: 01/22/2022  Patient will be independent with advanced HEP and self-management strategies to improve functional outcomes Baseline:  Goal status: IN PROGRESS  2.  Patient will improve FOTO score to predicted value to indicate improvement in functional outcomes Baseline: 48% function  Goal status: IN PROGRESS  3.  Patient will demo improved bilateral cervical rotation by  at least 10 degrees in order to improve ability to scan environment for safety and while driving. Baseline: See AROM  Goal status: IN PROGRESS  4. Patient will report a decrease in neck pain to no more than 3/10 with turning head for improved quality of life and ability to perform UE ADLs  Baseline: 7-8/10 Goal status: IN PROGRESS   5. Patient will report at least 50% overall improvement in subjective complaint of headaches to indicate improvement in QOL and ability to participate in ADLs.  Baseline:  Goal status: IN PROGRESS  PLAN: PT FREQUENCY: 1-2x/week  PT DURATION: 6 weeks  PLANNED INTERVENTIONS: Therapeutic exercises, Therapeutic activity, Neuromuscular re-education, Balance training, Gait training, Patient/Family education, Joint manipulation, Joint mobilization, Stair training, Aquatic Therapy, Dry Needling, Electrical stimulation, Spinal manipulation, Spinal mobilization, Cryotherapy, Moist heat, scar mobilization, Taping, Traction, Ultrasound, Biofeedback, Ionotophoresis '4mg'$ /ml Dexamethasone, and Manual therapy.   PLAN FOR NEXT SESSION: Cont Manual STM. Progress postural strength and posturing. Review SNAGs,  build upper trap stretching, bands when ready    10:45 AM, 12/19/21  Margarette Asal. Carlis Abbott PT, DPT  Contract Physical Therapist at  [facility] [phone]

## 2021-12-20 ENCOUNTER — Encounter: Payer: Self-pay | Admitting: Cardiology

## 2021-12-20 ENCOUNTER — Ambulatory Visit: Payer: 59 | Attending: Cardiology | Admitting: Cardiology

## 2021-12-20 VITALS — BP 100/80 | HR 83 | Ht 65.0 in | Wt 209.0 lb

## 2021-12-20 DIAGNOSIS — R002 Palpitations: Secondary | ICD-10-CM | POA: Diagnosis not present

## 2021-12-20 DIAGNOSIS — Z79899 Other long term (current) drug therapy: Secondary | ICD-10-CM | POA: Diagnosis not present

## 2021-12-20 NOTE — Patient Instructions (Signed)
Medication Instructions:  The current medical regimen is effective;  continue present plan and medications.  *If you need a refill on your cardiac medications before your next appointment, please call your pharmacy*   Testing/Procedures: Your physician has requested that you have an exercise tolerance test. For further information please visit [email or web address]. Please also follow instruction sheet, as given.   Follow-Up: At [facility], you and your health needs are our priority.  As part of our continuing mission to provide you with exceptional heart care, we have created designated Provider Care Teams.  These Care Teams include your primary Cardiologist (physician) and Advanced Practice Providers (APPs -  Physician Assistants and Nurse Practitioners) who all work together to provide you with the care you need, when you need it.  We recommend signing up for the patient portal called "MyChart".  Sign up information is provided on this After Visit Summary.  MyChart is used to connect with patients for Virtual Visits (Telemedicine).  Patients are able to view lab/test results, encounter notes, upcoming appointments, etc.  Non-urgent messages can be sent to your provider as well.   To learn more about what you can do with MyChart, go to [email or web address].    Your next appointment:   6 month(s)  The format for your next appointment:   In Person  Provider:   Dr Candee Furbish      Important Information About Sugar

## 2021-12-20 NOTE — Progress Notes (Signed)
Cardiology Office Note:    Date:  12/20/2021   ID:  Kendra Soto, DOB 01-18-1976, MRN [id number]  PCP:  [place] Nation, MD   [facility] Providers Cardiologist:  None     Referring MD: [place] Nation, MD    History of Present Illness:    Kendra Soto is a 46 y.o. female here for the follow up of palpitations.   At the last visit she was taken off the metoprolol and we ordered a monitor and it showed frequent PVC's at 12.8% burden . Given the frequency of PVCs, and no significant response to Toprol-XL 25 mg, she was started on flecainide 50 mg twice a day for suppression.    From original consult visit: Saw cardiology about 2-3 years ago. Had thumping and a pause (describes PVCs). Did several tests including stress test as well as echocardiogram; 2021. Toprol did not seem to help. Feels at night. Left side.   Minor light headed at times, HA. No syncope. Rare sharp pain in center of center of chest. ?GERD.   She had hypothyroidism on 75 mcg of levothyroxine.  She took metoprolol XL because of history of tachycardia in the past. Has low BP at times with Toprol.   Has a history of migraines, asthma  EKG from October 03, 2021 showed heart rate of 70 with PVC no other significant abnormalities.  Potassium 4.2 creatinine 0.89 ALT 8, white blood cell count 10, TSH 5.8 mildly elevated, LDL 74, triglycerides 69, hemoglobin A1c 5.9   Today:  She states that Flecainide has been helping; she doesn't notice as many palpitations, and when she does it is mild. She denies noticing rapid heart rates.   She complains of swollen feet and legs, usually L>R. She states that compression socks are painful for her, she thinks it might be because it isn't the right fit for her.   She is also taking bladder control medications.   She was concerned about her blood pressure but denies any symptoms. At prior clinic visits her blood pressure has been low.  She  denies any chest pain, or shortness  of breath. No lightheadedness, headaches, syncope, orthopnea, or PND.   Past Medical History:  Diagnosis Date   Allergies    Asthma    Dysrhythmia    GERD (gastroesophageal reflux disease)    History of kidney stones    Hx of migraine headaches    Hypothyroid    Overactive bladder    Palpitations     Past Surgical History:  Procedure Laterality Date   ABDOMINAL HYSTERECTOMY     CHOLECYSTECTOMY  2016   COLONOSCOPY WITH PROPOFOL N/A 12/03/2021   Procedure: COLONOSCOPY WITH PROPOFOL;  Surgeon: Eloise Harman, DO;  [facility];  Service: Endoscopy;  Laterality: N/A;  10:30am, asa 2   ESOPHAGOGASTRODUODENOSCOPY N/A 04/30/2018   normal. Empiric dilatation   MALONEY DILATION N/A 04/30/2018   Procedure: Venia Minks DILATION;  Surgeon: Daneil Dolin, MD;  [facility];  Service: Endoscopy;  Laterality: N/A;   POLYPECTOMY  12/03/2021   Procedure: POLYPECTOMY;  Surgeon: Eloise Harman, DO;  [facility];  Service: Endoscopy;;   TUBAL LIGATION     VAGINAL HYSTERECTOMY N/A 01/22/2021   Procedure: HYSTERECTOMY VAGINAL;  Surgeon: Janyth Pupa, DO;  Location: [facility];  Service: Gynecology;  Laterality: N/A;   WISDOM TOOTH EXTRACTION      Current Medications: Current Meds  Medication Sig   albuterol (VENTOLIN HFA) 108 (90 Base)  MCG/ACT inhaler Inhale 2 puffs into the lungs every 6 (six) hours as needed for wheezing or shortness of breath.   Azelastine-Fluticasone 137-50 MCG/ACT SUSP Place 1 spray into the nose 2 (two) times daily.   clobetasol ointment (TEMOVATE) 4.69 % Apply 1 Application topically 2 (two) times daily as needed (irritation).   Erenumab-aooe (AIMOVIG) 70 MG/ML SOAJ Inject 70 mg into the skin every 30 (thirty) days.   flecainide (TAMBOCOR) 50 MG tablet Take 1 tablet (50 mg total) by mouth 2 (two) times daily.   fluticasone 0.05%-ketoconazole 2% 1:1 cream mixture Apply 1 application  topically daily as needed for irritation.    fluticasone-salmeterol (ADVAIR) 500-50 MCG/ACT AEPB INHALE 1 PUFF INTO THE LUNGS EVERY MORNING AND AT BEDTIME   ipratropium (ATROVENT) 0.03 % nasal spray 1 spray per nostril 2-3 times daily as needed.   levocetirizine (XYZAL) 5 MG tablet Take 1 tablet (5 mg total) by mouth every evening.   levothyroxine (SYNTHROID) 75 MCG tablet Take 75 mcg by mouth daily before breakfast.   montelukast (SINGULAIR) 10 MG tablet TAKE 1 TABLET BY MOUTH AT BEDTIME   omeprazole (PRILOSEC) 40 MG capsule Take 40 mg by mouth daily.   oxybutynin (DITROPAN-XL) 10 MG 24 hr tablet Take 10 mg by mouth every evening.   UBRELVY 100 MG TABS Take 100 mg by mouth daily as needed for migraine.   zolmitriptan (ZOMIG) 5 MG tablet Take 1 tablet (5 mg total) by mouth as needed for migraine. May repeat a dose in 2 hours if headache persists     Allergies:   Bee pollen and Short ragweed pollen ext   Social History   Socioeconomic History   Marital status: Married    Spouse name: Not on file   Number of children: 3   Years of education: Not on file   Highest education level: Not on file  Occupational History   Not on file  Tobacco Use   Smoking status: Former    Packs/day: 1.00    Years: 20.00    Total pack years: 20.00    Types: Cigarettes    Quit date: 2009    Years since quitting: 14.7   Smokeless tobacco: Never  Vaping Use   Vaping Use: Never used  Substance and Sexual Activity   Alcohol use: No   Drug use: No   Sexual activity: Yes    Birth control/protection: Surgical    Comment: hyst  Other Topics Concern   Not on file  Social History Narrative   Not on file   Social Determinants of Health   Financial Resource Strain: Low Risk  (05/08/2020)   Overall Financial Resource Strain (CARDIA)    Difficulty of Paying Living Expenses: Not very hard  Food Insecurity: Food Insecurity Present (05/08/2020)   Hunger Vital Sign    Worried About Running Out of Food in the Last Year: Often true    Ran Out of Food in  the Last Year: Never true  Transportation Needs: No Transportation Needs (05/08/2020)   PRAPARE - Hydrologist (Medical): No    Lack of Transportation (Non-Medical): No  Physical Activity: Inactive (05/08/2020)   Exercise Vital Sign    Days of Exercise per Week: 0 days    Minutes of Exercise per Session: 0 min  Stress: No Stress Concern Present (05/08/2020)   [place]    Feeling of Stress : Only a little  Social Connections: Moderately Isolated (  05/08/2020)   Social Connection and Isolation Panel [NHANES]    Frequency of Communication with Friends and Family: Once a week    Frequency of Social Gatherings with Friends and Family: Once a week    Attends Religious Services: More than 4 times per year    Active Member of [employer] or Organizations: No    Attends Music therapist: Never    Marital Status: Married     Family History: The patient's family history includes Allergic rhinitis in her brother and father; Cancer in her father; Hypertension in her father and mother; Rheum arthritis in her sister. There is no history of Colon cancer, Celiac disease, Crohn's disease, or Colon polyps.  ROS:   Please see the history of present illness.    (+) Bilateral LE edema (L>R) (+) Palpitations  All other systems reviewed and are negative.  EKGs/Labs/Other Studies Reviewed:    The following studies were reviewed today:   Monitor 11/26/2021: - Sinus rhythm heart rate average 85 bpm. - Rare PACs - Frequent PVCs, 12.8%. - No atrial fibrillation present  Myocardial Perfusion Spect Multiple 10/28/2019: ([place]) Impressions:  1. No reversible ischemia or infarction.   2. Septal hypokinesis..   3. Left ventricular ejection fraction 45%   4. Non invasive risk stratification*: Intermediate     Stress test 10/2019: [facility])  Stress Findings  - The patient exercised for 5  minutes 13 seconds of a Bruce protocol reaching into stage 2 with a peak heart rate of 157 (90% of PMHR) achieving 7.0 METS .  - The patient reported dyspnea during the stress test.   - The stress was stopped due to target heart rate achieved and fatigue.  - Overall, the patient's exercise capacity was good. Blood pressure demonstrated a normal response to exercise. Heart rate demonstrated a normal response to exercise.   Baseline ECG  - Baseline ECG shows normal sinus rhythm and normal axis.   Stress ECG  - No significant ST segment changes or arrhythmias were noted during stress.  ECHO 10/04/19: ([place])  Summary    1. The left ventricle is normal in size with normal wall thickness.    2. The left ventricular systolic function is overall normal, LVEF is  visually estimated at 50-55%.    3. The right ventricle is normal in size, with normal systolic function.     EKG:  EKG is personally reviewed.    12/20/2021: Sinus rhythm . Rate 83 bpm. LVH. QRS duration 72 ms 10/31/2021: Sinus rhythm 84, PVCs, nonspecific T wave flattening.  Recent Labs: 01/18/2021: BUN 17; Creatinine, Ser 0.68; Potassium 3.4; Sodium 138 06/10/2021: Hemoglobin 12.7; Platelets 288  Recent Lipid Panel No results found for: "CHOL", "TRIG", "HDL", "CHOLHDL", "VLDL", "LDLCALC", "LDLDIRECT"   Risk Assessment/Calculations:              Physical Exam:    VS:  BP 100/80 (BP Location: Left Arm, Patient Position: Sitting, Cuff Size: Normal)   Pulse 83   Ht '5\' 5"'$  (1.651 m)   Wt 209 lb (94.8 kg)   LMP 01/22/2021 Comment: Pregnancy test negative on 01/18/2021  BMI 34.78 kg/m     Wt Readings from Last 3 Encounters:  12/20/21 209 lb (94.8 kg)  11/27/21 207 lb (93.9 kg)  11/05/21 206 lb (93.4 kg)     GEN:  Well nourished, well developed in no acute distress HEENT: Normal NECK: No JVD; No carotid bruits LYMPHATICS: No lymphadenopathy CARDIAC: Rare ectopy  noted RRR, no murmurs, no rubs,  gallops RESPIRATORY:  Clear to auscultation without rales, wheezing or rhonchi  ABDOMEN: Soft, non-tender, non-distended MUSCULOSKELETAL:  Bilateral mild LE edema (R>L); No deformity  SKIN: Warm and dry NEUROLOGIC:  Alert and oriented x 3 PSYCHIATRIC:  Normal affect   ASSESSMENT:    1. Palpitations   2. Medication management     PLAN:    In order of problems listed above:  Palpitations - Has had a prior history of tachycardia and has taken Toprol-XL 25 mg once a day but this did not have much of an effect.  After Zio patch monitor showed 12% PVCs, we started flecainide 50 mg twice a day.  She is feeling better on this medication.  Less palpitations.  Less frequency. - We will go ahead and check an exercise treadmill test for flecainide use.  Make sure that there are no adverse arrhythmias. She was concerned about atrial fibrillation after seeing a commercial on TV.  I reassured her that what we are seeing currently or PVCs only. - Her outside referral EKG shows an isolated PVC.  This could certainly be the reason for her palpitations and symptoms.  Hypotension - Occasional hypotension is noted.  We have taken her off of her metoprolol and see if this helps.  Blood pressure is a little bit better today.  Pump function is reassuring.  Follow up: 6 months  Medication Adjustments/Labs and Tests Ordered: Current medicines are reviewed at length with the patient today.  Concerns regarding medicines are outlined above.   Orders Placed This Encounter  Procedures   Exercise Tolerance Test   EKG 12-Lead   No orders of the defined types were placed in this encounter.  Patient Instructions  Medication Instructions:  The current medical regimen is effective;  continue present plan and medications.  *If you need a refill on your cardiac medications before your next appointment, please call your pharmacy*   Testing/Procedures: Your physician has requested that you have an exercise  tolerance test. For further information please visit [email or web address]. Please also follow instruction sheet, as given.   Follow-Up: At [facility], you and your health needs are our priority.  As part of our continuing mission to provide you with exceptional heart care, we have created designated Provider Care Teams.  These Care Teams include your primary Cardiologist (physician) and Advanced Practice Providers (APPs -  Physician Assistants and Nurse Practitioners) who all work together to provide you with the care you need, when you need it.  We recommend signing up for the patient portal called "MyChart".  Sign up information is provided on this After Visit Summary.  MyChart is used to connect with patients for Virtual Visits (Telemedicine).  Patients are able to view lab/test results, encounter notes, upcoming appointments, etc.  Non-urgent messages can be sent to your provider as well.   To learn more about what you can do with MyChart, go to [email or web address].    Your next appointment:   6 month(s)  The format for your next appointment:   In Person  Provider:   Dr Candee Furbish      Important Information About Sugar          I,Mathew Stumpf,acting as a scribe for Candee Furbish, MD.,have documented all relevant documentation on the behalf of Candee Furbish, MD,as directed by  Candee Furbish, MD while in the presence of Candee Furbish, MD.  I, Candee Furbish, MD, have reviewed all documentation for this visit. The documentation  on 12/20/21 for the exam, diagnosis, procedures, and orders are all accurate and complete.   Signed, Candee Furbish, MD  12/20/2021 3:26 PM    [place]

## 2021-12-25 ENCOUNTER — Ambulatory Visit: Payer: 59 | Attending: Cardiology

## 2021-12-25 DIAGNOSIS — R002 Palpitations: Secondary | ICD-10-CM | POA: Diagnosis not present

## 2021-12-25 DIAGNOSIS — Z79899 Other long term (current) drug therapy: Secondary | ICD-10-CM | POA: Diagnosis not present

## 2021-12-25 LAB — EXERCISE TOLERANCE TEST
Angina Index: 0
Duke Treadmill Score: 6
Estimated workload: 7.2
Exercise duration (min): 6 min
Exercise duration (sec): 10 s
MPHR: 173 {beats}/min
Peak HR: 155 {beats}/min
Percent HR: 89 %
Rest HR: 84 {beats}/min
ST Depression (mm): 0 mm

## 2021-12-26 ENCOUNTER — Ambulatory Visit (HOSPITAL_COMMUNITY): Payer: 59

## 2021-12-26 ENCOUNTER — Encounter (HOSPITAL_COMMUNITY): Payer: Self-pay

## 2021-12-26 DIAGNOSIS — M542 Cervicalgia: Secondary | ICD-10-CM

## 2021-12-26 DIAGNOSIS — R928 Other abnormal and inconclusive findings on diagnostic imaging of breast: Secondary | ICD-10-CM | POA: Diagnosis not present

## 2021-12-26 NOTE — Therapy (Signed)
OUTPATIENT PHYSICAL THERAPY CERVICAL TREATMENT   Patient Name: Kendra Soto MRN: [id number] DOB:04-18-1975, 46 y.o., female Today's Date: 12/26/2021   PT End of Session - 12/26/21 1527     Visit Number 3    Number of Visits 12    Date for PT Re-Evaluation 01/22/22    Authorization Type [employer]    Progress Note Due on Visit 10    PT Start Time 1518    PT Stop Time 1604    PT Time Calculation (min) 46 min    Activity Tolerance Patient tolerated treatment well    Behavior During Therapy [facility] for tasks assessed/performed              Past Medical History:  Diagnosis Date   Allergies    Asthma    Dysrhythmia    GERD (gastroesophageal reflux disease)    History of kidney stones    Hx of migraine headaches    Hypothyroid    Overactive bladder    Palpitations    Past Surgical History:  Procedure Laterality Date   ABDOMINAL HYSTERECTOMY     CHOLECYSTECTOMY  2016   COLONOSCOPY WITH PROPOFOL N/A 12/03/2021   Procedure: COLONOSCOPY WITH PROPOFOL;  Surgeon: Eloise Harman, DO;  [facility];  Service: Endoscopy;  Laterality: N/A;  10:30am, asa 2   ESOPHAGOGASTRODUODENOSCOPY N/A 04/30/2018   normal. Empiric dilatation   MALONEY DILATION N/A 04/30/2018   Procedure: Venia Minks DILATION;  Surgeon: Daneil Dolin, MD;  Location: [facility];  Service: Endoscopy;  Laterality: N/A;   POLYPECTOMY  12/03/2021   Procedure: POLYPECTOMY;  Surgeon: Eloise Harman, DO;  Location: [facility];  Service: Endoscopy;;   TUBAL LIGATION     VAGINAL HYSTERECTOMY N/A 01/22/2021   Procedure: HYSTERECTOMY VAGINAL;  Surgeon: Janyth Pupa, DO;  Location: [facility];  Service: Gynecology;  Laterality: N/A;   WISDOM TOOTH EXTRACTION     Patient Active Problem List   Diagnosis Date Noted   Rheumatoid arthritis ([place]) 04/12/2020   High risk medication use 04/12/2020   Chronic RUQ pain 04/16/2018   Abdominal pain, chronic, epigastric 04/16/2018   GERD (gastroesophageal reflux  disease) 04/16/2018   Esophageal dysphagia 04/16/2018    PCP: Stana Bunting MD  REFERRING PROVIDER: Genia Harold, MD   REFERRING DIAG: M54.2 (ICD-10-CM) - Cervicalgia   THERAPY DIAG:  Cervicalgia  Rationale for Evaluation and Treatment Rehabilitation  ONSET DATE: 7-8 years   SUBJECTIVE:  SUBJECTIVE STATEMENT: Reports she is pain free without movements, increased pain with rotation.  Noticed she has began to twist at trunk when driving to avoid pain.  PERTINENT HISTORY:  NA  PAIN:  Are you having pain? Yes: NPRS scale: no pain still, increased 5-6 with movement/10 Pain location: neck  Pain description: throbbing, sore, aching, sharp   Aggravating factors: turning head, arm movement  Relieving factors: meds, rest, quiet/ dark places   PRECAUTIONS: None  WEIGHT BEARING RESTRICTIONS No  FALLS:  Has patient fallen in last 6 months? No  LIVING ENVIRONMENT: Lives with: lives with their family and lives with their spouse Lives in: House/apartment Stairs: Yes: External: 4 steps; can reach both Has following equipment at home: None  OCCUPATION: Unemployed   PLOF: Independent  PATIENT GOALS Find out whats wrong/ stop headaches   OBJECTIVE:   DIAGNOSTIC FINDINGS:  None   PATIENT SURVEYS:  FOTO 48% function    COGNITION: Overall cognitive status: Within functional limits for tasks assessed   SENSATION: WFL  POSTURE: rounded shoulders  PALPATION: Mod TTP about RT cervical paraspinals, sub occipitals    CERVICAL ROM:   Active ROM A/PROM (deg) eval  Flexion 30  Extension 35  Right lateral flexion   Left lateral flexion   Right rotation 50  Left rotation 50   (Blank rows = not tested)  UPPER EXTREMITY ROM: Bilateral shoulder AROM WFL  UPPER  EXTREMITY MMT:  MMT Right eval Left eval  Shoulder flexion 5 5  Shoulder extension    Shoulder abduction    Shoulder adduction    Shoulder extension    Shoulder internal rotation 5 5  Shoulder external rotation 5 5  Middle trapezius    Lower trapezius    Elbow flexion    Elbow extension    Wrist flexion    Wrist extension    Wrist ulnar deviation    Wrist radial deviation    Wrist pronation    Wrist supination    Grip strength     (Blank rows = not tested)    TODAY'S TREATMENT:  12/26/21 Seated: 3D excursion 5x each direction pain free range  WBack 10x   Scapular retraction with ER  Reviewed SNAG for  extension and rotation 10" holds  Chin tuck 10x 5"   12/19/21 = Discussion and education on evaluation findings, review Goals, and discuss as below  There-Act = postural awareness and breathing   Manual = Supine positioning, wedge under knees, 2 pillows under head   - STW to gross C/S with focus on upper trap and suboccipitals, suboccipital release, UT lengthening   - Trial of grade III central cervical PA PIVMS x 30 reps each C7 up to C3   - PROM stretching cervical upper rotation and lateral flexion x 20 sec x 6 reps each B C/S   There-Ex/ review HEP  =    - Chin tuck x 10 reps with cue for eyes and head level   - Scap retraction x 10 reps with "no money" cue   - SNAG central with towel with chin tuck x 10 reps   - SNAG central with towel with C/S upper extension x 10 reps   - SNAG rotational with towel arms crossed for C/S upper rotation B x 10 reps    Eval  Scap retraction Chin tuck    PATIENT EDUCATION:  Education details: on Eval findings, POC and HEP  Person educated: Patient Education method: Explanation Education comprehension: verbalized understanding  HOME EXERCISE PROGRAM: Access Code: [email or web address] URL: [email or web address] Date: 12/11/2021 Prepared by: Josue Hector  12/26/21:  Upper trap stretch 3x 30" Exercises - Seated  Scapular Retraction  - 2-3 x daily - 7 x weekly - 1-2 sets - 10 reps - 5 seconds hold - Seated Passive Cervical Retraction  - 2-3 x daily - 7 x weekly - 1-2 sets - 10 reps  Added 12/19/21 =  - Cervical Extension AROM with Strap  - 2-3 x daily - 7 x weekly - 1-2 sets - 10 reps - Seated Assisted Cervical Rotation with Towel  - 2-3 x daily - 7 x weekly - 1-2 sets - 10 reps  ASSESSMENT:  CLINICAL IMPRESSION: Patient is a 46 y.o. female who presents to first full physical therapy treatment today.  Today's session review goals and findings with education on overall postural imbalances and tensions. Patient tolerated new trial of manual work well with supported HEP for continued upper cervical opening with SNAGs.  Demonstrated high guarding and tensions into suboccipitals mostly today.  Patient will benefit from skilled physical therapy services to address these deficits to reduce pain and improve level of function with ADLs  Session focus with postural strengthening and mobility exercises to address tightness.  Pt able to recall and demonstrate good mechanics with current HEP and additional SNAGs.  Added some seated postural strengthening exercises and 3D excursion to POC with good tolerance.  Manual STM complete to address cervical restrictions with moderate tightness in upper trap and levator scapula, pt felt relief with manual traction, noted tension with suboccipitals.  Added UT stretch to HEP with printout given.     OBJECTIVE IMPAIRMENTS decreased activity tolerance, decreased ROM, increased fascial restrictions, impaired flexibility, postural dysfunction, and pain.   ACTIVITY LIMITATIONS carrying, lifting, and reach over head  PARTICIPATION LIMITATIONS: meal prep, cleaning, laundry, driving, shopping, community activity, and yard work  PERSONAL FACTORS Time since onset of injury/illness/exacerbation are also affecting patient's functional outcome.   REHAB POTENTIAL: Good  CLINICAL DECISION  MAKING: Stable/uncomplicated  EVALUATION COMPLEXITY: Low   GOALS: SHORT TERM GOALS: Target date: 01/01/2022  Patient will be independent with initial HEP and self-management strategies to improve functional outcomes Baseline:  Goal status: IN PROGRESS   LONG TERM GOALS: Target date: 01/22/2022  Patient will be independent with advanced HEP and self-management strategies to improve functional outcomes Baseline:  Goal status: IN PROGRESS  2.  Patient will improve FOTO score to predicted value to indicate improvement in functional outcomes Baseline: 48% function  Goal status: IN PROGRESS  3.  Patient will demo improved bilateral cervical rotation by at least 10 degrees in order to improve ability to scan environment for safety and while driving. Baseline: See AROM  Goal status: IN PROGRESS  4. Patient will report a decrease in neck pain to no more than 3/10 with turning head for improved quality of life and ability to perform UE ADLs  Baseline: 7-8/10 Goal status: IN PROGRESS   5. Patient will report at least 50% overall improvement in subjective complaint of headaches to indicate improvement in QOL and ability to participate in ADLs.  Baseline:  Goal status: IN PROGRESS  PLAN: PT FREQUENCY: 1-2x/week  PT DURATION: 6 weeks  PLANNED INTERVENTIONS: Therapeutic exercises, Therapeutic activity, Neuromuscular re-education, Balance training, Gait training, Patient/Family education, Joint manipulation, Joint mobilization, Stair training, Aquatic Therapy, Dry Needling, Electrical stimulation, Spinal manipulation, Spinal mobilization, Cryotherapy, Moist heat, scar mobilization, Taping, Traction, Ultrasound, Biofeedback, Ionotophoresis '4mg'$ /ml Dexamethasone, and Manual therapy.  PLAN FOR NEXT SESSION: Cont Manual STM. Progress postural strength and posturing.  build upper trap stretching, bands when ready.  Give pt massage therapist list and begin RTB rows/shoulder extension next session.   Add thoracic mobility.   Ihor Austin, LPTA/CLT; CBIS [phone]  4:20 PM, 12/26/21

## 2022-01-01 ENCOUNTER — Ambulatory Visit (HOSPITAL_COMMUNITY): Payer: 59 | Admitting: Physical Therapy

## 2022-01-01 DIAGNOSIS — M542 Cervicalgia: Secondary | ICD-10-CM | POA: Diagnosis not present

## 2022-01-01 DIAGNOSIS — R928 Other abnormal and inconclusive findings on diagnostic imaging of breast: Secondary | ICD-10-CM | POA: Diagnosis not present

## 2022-01-01 NOTE — Therapy (Signed)
OUTPATIENT PHYSICAL THERAPY CERVICAL TREATMENT   Patient Name: Kendra Soto MRN: [id number] DOB:1976/01/06, 46 y.o., female Today's Date: 01/01/2022   PT End of Session - 01/01/22 1306     Visit Number 4    Number of Visits 12    Date for PT Re-Evaluation 01/22/22    Authorization Type [employer]    Progress Note Due on Visit 10    PT Start Time 1303    PT Stop Time 1342    PT Time Calculation (min) 39 min    Activity Tolerance Patient tolerated treatment well    Behavior During Therapy WFL for tasks assessed/performed              Past Medical History:  Diagnosis Date   Allergies    Asthma    Dysrhythmia    GERD (gastroesophageal reflux disease)    History of kidney stones    Hx of migraine headaches    Hypothyroid    Overactive bladder    Palpitations    Past Surgical History:  Procedure Laterality Date   ABDOMINAL HYSTERECTOMY     CHOLECYSTECTOMY  2016   COLONOSCOPY WITH PROPOFOL N/A 12/03/2021   Procedure: COLONOSCOPY WITH PROPOFOL;  Surgeon: Eloise Harman, DO;  [facility];  Service: Endoscopy;  Laterality: N/A;  10:30am, asa 2   ESOPHAGOGASTRODUODENOSCOPY N/A 04/30/2018   normal. Empiric dilatation   MALONEY DILATION N/A 04/30/2018   Procedure: Venia Minks DILATION;  Surgeon: Daneil Dolin, MD;  [facility];  Service: Endoscopy;  Laterality: N/A;   POLYPECTOMY  12/03/2021   Procedure: POLYPECTOMY;  Surgeon: Eloise Harman, DO;  [facility];  Service: Endoscopy;;   TUBAL LIGATION     VAGINAL HYSTERECTOMY N/A 01/22/2021   Procedure: HYSTERECTOMY VAGINAL;  Surgeon: Janyth Pupa, DO;  Location: [facility];  Service: Gynecology;  Laterality: N/A;   WISDOM TOOTH EXTRACTION     Patient Active Problem List   Diagnosis Date Noted   Rheumatoid arthritis ([place]) 04/12/2020   High risk medication use 04/12/2020   Chronic RUQ pain 04/16/2018   Abdominal pain, chronic, epigastric 04/16/2018   GERD (gastroesophageal reflux  disease) 04/16/2018   Esophageal dysphagia 04/16/2018    PCP: Stana Bunting MD  REFERRING PROVIDER: Genia Harold, MD   REFERRING DIAG: M54.2 (ICD-10-CM) - Cervicalgia   THERAPY DIAG:  Cervicalgia  Rationale for Evaluation and Treatment Rehabilitation  ONSET DATE: 7-8 years   SUBJECTIVE:  SUBJECTIVE STATEMENT: Ongoing pain on RT side. Feels like it is the way she sleeps. Compliant with HEP but no difference in ROM noted.  PERTINENT HISTORY:  NA  PAIN:  Are you having pain? Yes: NPRS scale: 3/10 Pain location: Rt side neck  Pain description: throbbing, sore, aching, sharp   Aggravating factors: turning head, arm movement  Relieving factors: meds, rest, quiet/ dark places   PRECAUTIONS: None  WEIGHT BEARING RESTRICTIONS No  FALLS:  Has patient fallen in last 6 months? No  LIVING ENVIRONMENT: Lives with: lives with their family and lives with their spouse Lives in: House/apartment Stairs: Yes: External: 4 steps; can reach both Has following equipment at home: None  OCCUPATION: Unemployed   PLOF: Independent  PATIENT GOALS Find out whats wrong/ stop headaches   OBJECTIVE:   DIAGNOSTIC FINDINGS:  None   PATIENT SURVEYS:  FOTO 48% function    COGNITION: Overall cognitive status: Within functional limits for tasks assessed   SENSATION: WFL  POSTURE: rounded shoulders  PALPATION: Mod TTP about RT cervical paraspinals, sub occipitals    CERVICAL ROM:   Active ROM A/PROM (deg) eval  Flexion 30  Extension 35  Right lateral flexion   Left lateral flexion   Right rotation 50  Left rotation 50   (Blank rows = not tested)  UPPER EXTREMITY ROM: Bilateral shoulder AROM WFL  UPPER EXTREMITY MMT:  MMT Right eval Left eval  Shoulder flexion 5 5   Shoulder extension    Shoulder abduction    Shoulder adduction    Shoulder extension    Shoulder internal rotation 5 5  Shoulder external rotation 5 5  Middle trapezius    Lower trapezius    Elbow flexion    Elbow extension    Wrist flexion    Wrist extension    Wrist ulnar deviation    Wrist radial deviation    Wrist pronation    Wrist supination    Grip strength     (Blank rows = not tested)    TODAY'S TREATMENT:  01/01/22 Chin tuck 10 x 3"  Chin tuck with extension x10 Seated upper trap stretch 3 x30"  Bilateral SNAG rotation 10 x 3" each  SNAG cervical extension 10 x 3" Thoracic rotation seated x10 each  Band row RTB 2 x 10  Shoulder extension RTB 2 x 10  Manual STM to RT upper trap with active release during trap stretch   12/26/21 Seated: 3D excursion 5x each direction pain free range  WBack 10x   Scapular retraction with ER  Reviewed SNAG for  extension and rotation 10" holds  Chin tuck 10x 5"   12/19/21 = Discussion and education on evaluation findings, review Goals, and discuss as below  There-Act = postural awareness and breathing   Manual = Supine positioning, wedge under knees, 2 pillows under head   - STW to gross C/S with focus on upper trap and suboccipitals, suboccipital release, UT lengthening   - Trial of grade III central cervical PA PIVMS x 30 reps each C7 up to C3   - PROM stretching cervical upper rotation and lateral flexion x 20 sec x 6 reps each B C/S   There-Ex/ review HEP  =    - Chin tuck x 10 reps with cue for eyes and head level   - Scap retraction x 10 reps with "no money" cue   - SNAG central with towel with chin tuck x 10 reps   - SNAG central with  towel with C/S upper extension x 10 reps   - SNAG rotational with towel arms crossed for C/S upper rotation B x 10 reps    Eval  Scap retraction Chin tuck    PATIENT EDUCATION:  Education details: on Eval findings, POC and HEP  Person educated: Patient Education method:  Explanation Education comprehension: verbalized understanding    HOME EXERCISE PROGRAM: Access Code: [email or web address] URL: [email or web address] 01/01/22  Standing Shoulder Row with Anchored Resistance  - 1-2 x daily - 7 x weekly - 2 sets - 10 reps - Shoulder extension with resistance - Neutral  - 1-2 x daily - 7 x weekly - 2 sets - 10 reps  12/26/21:  Upper trap stretch 3x 30" Exercises - Seated Scapular Retraction  - 2-3 x daily - 7 x weekly - 1-2 sets - 10 reps - 5 seconds hold - Seated Passive Cervical Retraction  - 2-3 x daily - 7 x weekly - 1-2 sets - 10 reps  Added 12/19/21 =  - Cervical Extension AROM with Strap  - 2-3 x daily - 7 x weekly - 1-2 sets - 10 reps - Seated Assisted Cervical Rotation with Towel  - 2-3 x daily - 7 x weekly - 1-2 sets - 10 reps  ASSESSMENT:  CLINICAL IMPRESSION: Patient tolerated session well overall. Continues to demo neck stiffness and restricted cervical ROM. Improved 4-6 degrees in bilateral rotation following SNAG with verbal cue and demo for proper mechanics. Also added thoracic rotation for improved mobility. Added band rows and shoulder extension to HEP and issued handout. Patient will continue to benefit from skilled therapy services to reduce remaining deficits and improve functional ability.   OBJECTIVE IMPAIRMENTS decreased activity tolerance, decreased ROM, increased fascial restrictions, impaired flexibility, postural dysfunction, and pain.   ACTIVITY LIMITATIONS carrying, lifting, and reach over head  PARTICIPATION LIMITATIONS: meal prep, cleaning, laundry, driving, shopping, community activity, and yard work  PERSONAL FACTORS Time since onset of injury/illness/exacerbation are also affecting patient's functional outcome.   REHAB POTENTIAL: Good  CLINICAL DECISION MAKING: Stable/uncomplicated  EVALUATION COMPLEXITY: Low   GOALS: SHORT TERM GOALS: Target date: 01/01/2022  Patient will be independent with initial HEP and  self-management strategies to improve functional outcomes Baseline:  Goal status: IN PROGRESS   LONG TERM GOALS: Target date: 01/22/2022  Patient will be independent with advanced HEP and self-management strategies to improve functional outcomes Baseline:  Goal status: IN PROGRESS  2.  Patient will improve FOTO score to predicted value to indicate improvement in functional outcomes Baseline: 48% function  Goal status: IN PROGRESS  3.  Patient will demo improved bilateral cervical rotation by at least 10 degrees in order to improve ability to scan environment for safety and while driving. Baseline: See AROM  Goal status: IN PROGRESS  4. Patient will report a decrease in neck pain to no more than 3/10 with turning head for improved quality of life and ability to perform UE ADLs  Baseline: 7-8/10 Goal status: IN PROGRESS   5. Patient will report at least 50% overall improvement in subjective complaint of headaches to indicate improvement in QOL and ability to participate in ADLs.  Baseline:  Goal status: IN PROGRESS  PLAN: PT FREQUENCY: 1-2x/week  PT DURATION: 6 weeks  PLANNED INTERVENTIONS: Therapeutic exercises, Therapeutic activity, Neuromuscular re-education, Balance training, Gait training, Patient/Family education, Joint manipulation, Joint mobilization, Stair training, Aquatic Therapy, Dry Needling, Electrical stimulation, Spinal manipulation, Spinal mobilization, Cryotherapy, Moist heat, scar mobilization, Taping, Traction, Ultrasound, Biofeedback, Ionotophoresis '4mg'$ /ml Dexamethasone,  and Manual therapy.   PLAN FOR NEXT SESSION:  Progress postural strength and posturing.  Give pt massage therapist list. Thoracic mobility.  1:07 PM, 01/01/22 Josue Hector PT DPT  Physical Therapist with [facility]  [phone]

## 2022-01-07 ENCOUNTER — Other Ambulatory Visit: Payer: Self-pay | Admitting: Allergy & Immunology

## 2022-01-08 ENCOUNTER — Ambulatory Visit (HOSPITAL_COMMUNITY): Payer: 59 | Admitting: Physical Therapy

## 2022-01-08 DIAGNOSIS — J320 Chronic maxillary sinusitis: Secondary | ICD-10-CM | POA: Diagnosis not present

## 2022-01-08 DIAGNOSIS — Z6833 Body mass index (BMI) 33.0-33.9, adult: Secondary | ICD-10-CM | POA: Diagnosis not present

## 2022-01-08 DIAGNOSIS — M542 Cervicalgia: Secondary | ICD-10-CM | POA: Diagnosis not present

## 2022-01-08 DIAGNOSIS — R059 Cough, unspecified: Secondary | ICD-10-CM | POA: Diagnosis not present

## 2022-01-08 DIAGNOSIS — Z20828 Contact with and (suspected) exposure to other viral communicable diseases: Secondary | ICD-10-CM | POA: Diagnosis not present

## 2022-01-08 DIAGNOSIS — R030 Elevated blood-pressure reading, without diagnosis of hypertension: Secondary | ICD-10-CM | POA: Diagnosis not present

## 2022-01-08 DIAGNOSIS — R928 Other abnormal and inconclusive findings on diagnostic imaging of breast: Secondary | ICD-10-CM | POA: Diagnosis not present

## 2022-01-08 NOTE — Therapy (Signed)
OUTPATIENT PHYSICAL THERAPY CERVICAL TREATMENT   Patient Name: Kendra Soto MRN: [id number] DOB:1975/06/26, 46 y.o., female Today's Date: 01/08/2022   PT End of Session - 01/08/22 1512     Visit Number 5    Number of Visits 12    Date for PT Re-Evaluation 01/22/22    Authorization Type [employer]    Progress Note Due on Visit 10    PT Start Time 1512    PT Stop Time 1552    PT Time Calculation (min) 40 min    Activity Tolerance Patient tolerated treatment well    Behavior During Therapy WFL for tasks assessed/performed              Past Medical History:  Diagnosis Date   Allergies    Asthma    Dysrhythmia    GERD (gastroesophageal reflux disease)    History of kidney stones    Hx of migraine headaches    Hypothyroid    Overactive bladder    Palpitations    Past Surgical History:  Procedure Laterality Date   ABDOMINAL HYSTERECTOMY     CHOLECYSTECTOMY  2016   COLONOSCOPY WITH PROPOFOL N/A 12/03/2021   Procedure: COLONOSCOPY WITH PROPOFOL;  Surgeon: Eloise Harman, DO;  [facility];  Service: Endoscopy;  Laterality: N/A;  10:30am, asa 2   ESOPHAGOGASTRODUODENOSCOPY N/A 04/30/2018   normal. Empiric dilatation   MALONEY DILATION N/A 04/30/2018   Procedure: Venia Minks DILATION;  Surgeon: Daneil Dolin, MD;  Location: [facility];  Service: Endoscopy;  Laterality: N/A;   POLYPECTOMY  12/03/2021   Procedure: POLYPECTOMY;  Surgeon: Eloise Harman, DO;  Location: [facility];  Service: Endoscopy;;   TUBAL LIGATION     VAGINAL HYSTERECTOMY N/A 01/22/2021   Procedure: HYSTERECTOMY VAGINAL;  Surgeon: Janyth Pupa, DO;  Location: [facility];  Service: Gynecology;  Laterality: N/A;   WISDOM TOOTH EXTRACTION     Patient Active Problem List   Diagnosis Date Noted   Rheumatoid arthritis ([place]) 04/12/2020   High risk medication use 04/12/2020   Chronic RUQ pain 04/16/2018   Abdominal pain, chronic, epigastric 04/16/2018   GERD (gastroesophageal reflux  disease) 04/16/2018   Esophageal dysphagia 04/16/2018    PCP: Stana Bunting MD  REFERRING PROVIDER: Genia Harold, MD   REFERRING DIAG: M54.2 (ICD-10-CM) - Cervicalgia   THERAPY DIAG:  Cervicalgia  Rationale for Evaluation and Treatment Rehabilitation  ONSET DATE: 7-8 years   SUBJECTIVE:  SUBJECTIVE STATEMENT: Neck is stiff and sore. Not feeling the greatest today. Had a headache yesterday. Manual muscle release may have contributed to a headache last time.   PERTINENT HISTORY:  NA  PAIN:  Are you having pain? Yes: NPRS scale: 4/10 Pain location: Rt side neck  Pain description: throbbing, sore, aching, sharp   Aggravating factors: turning head, arm movement  Relieving factors: meds, rest, quiet/ dark places   PRECAUTIONS: None  WEIGHT BEARING RESTRICTIONS No  FALLS:  Has patient fallen in last 6 months? No  LIVING ENVIRONMENT: Lives with: lives with their family and lives with their spouse Lives in: House/apartment Stairs: Yes: External: 4 steps; can reach both Has following equipment at home: None  OCCUPATION: Unemployed   PLOF: Independent  PATIENT GOALS Find out whats wrong/ stop headaches   OBJECTIVE:   DIAGNOSTIC FINDINGS:  None   PATIENT SURVEYS:  FOTO 48% function    COGNITION: Overall cognitive status: Within functional limits for tasks assessed   SENSATION: WFL  POSTURE: rounded shoulders  PALPATION: Mod TTP about RT cervical paraspinals, sub occipitals    CERVICAL ROM:   Active ROM A/PROM (deg) eval AROM 01/08/22  Flexion 30   Extension 35   Right lateral flexion    Left lateral flexion    Right rotation 50 59  Left rotation 50 57   (Blank rows = not tested)  UPPER EXTREMITY ROM: Bilateral shoulder AROM WFL  UPPER  EXTREMITY MMT:  MMT Right eval Left eval  Shoulder flexion 5 5  Shoulder extension    Shoulder abduction    Shoulder adduction    Shoulder extension    Shoulder internal rotation 5 5  Shoulder external rotation 5 5  Middle trapezius    Lower trapezius    Elbow flexion    Elbow extension    Wrist flexion    Wrist extension    Wrist ulnar deviation    Wrist radial deviation    Wrist pronation    Wrist supination    Grip strength     (Blank rows = not tested)    TODAY'S TREATMENT:   01/08/22 Supine: Chin tuck 15 x 3"  Scapular retraction 15 x 3"   Cervical rotation AROM x10    Band ER YTB x20   Shoulder abduction YTB x20   Shoulder flexion stretch 10 x5"  Seated:  Bilateral SNAG rotation 10 x 3" each  Cervical sidebend iso 10x5"  Standing: Band row YTB 2 x 10  Shoulder extension YTB 2 x 10 Shoulder flexion/ scaption 1lb 2 x 10 each    01/01/22 Chin tuck 10 x 3"  Chin tuck with extension x10 Seated upper trap stretch 3 x30"  Bilateral SNAG rotation 10 x 3" each  SNAG cervical extension 10 x 3" Thoracic rotation seated x10 each  Band row RTB 2 x 10  Shoulder extension RTB 2 x 10  Manual STM to RT upper trap with active release during trap stretch   12/26/21 Seated: 3D excursion 5x each direction pain free range  WBack 10x   Scapular retraction with ER  Reviewed SNAG for  extension and rotation 10" holds  Chin tuck 10x 5"   12/19/21 = Discussion and education on evaluation findings, review Goals, and discuss as below  There-Act = postural awareness and breathing   Manual = Supine positioning, wedge under knees, 2 pillows under head   - STW to gross C/S with focus on upper trap and suboccipitals, suboccipital release, UT lengthening   -  Trial of grade III central cervical PA PIVMS x 30 reps each C7 up to C3   - PROM stretching cervical upper rotation and lateral flexion x 20 sec x 6 reps each B C/S   There-Ex/ review HEP  =    - Chin tuck x 10 reps  with cue for eyes and head level   - Scap retraction x 10 reps with "no money" cue   - SNAG central with towel with chin tuck x 10 reps   - SNAG central with towel with C/S upper extension x 10 reps   - SNAG rotational with towel arms crossed for C/S upper rotation B x 10 reps    Eval  Scap retraction Chin tuck    PATIENT EDUCATION:  Education details: on Eval findings, POC and HEP  Person educated: Patient Education method: Explanation Education comprehension: verbalized understanding    HOME EXERCISE PROGRAM: Access Code: [email or web address] URL: [email or web address] 01/08/22 - Seated Isometric Cervical Sidebending  - 1-2 x daily - 7 x weekly - 1 sets - 10 reps - 5 second hold - Supine Cervical Retraction with Towel  - 1-2 x daily - 7 x weekly - 2 sets - 10 reps - Supine Scapular Retraction  - 1-2 x daily - 7 x weekly - 2 sets - 10 reps - 5 sec hold - Supine Shoulder External Rotation with Resistance  - 1-2 x daily - 7 x weekly - 2 sets - 10 reps  01/01/22  Standing Shoulder Row with Anchored Resistance  - 1-2 x daily - 7 x weekly - 2 sets - 10 reps - Shoulder extension with resistance - Neutral  - 1-2 x daily - 7 x weekly - 2 sets - 10 reps  12/26/21:  Upper trap stretch 3x 30" Exercises - Seated Scapular Retraction  - 2-3 x daily - 7 x weekly - 1-2 sets - 10 reps - 5 seconds hold - Seated Passive Cervical Retraction  - 2-3 x daily - 7 x weekly - 1-2 sets - 10 reps  Added 12/19/21 =  - Cervical Extension AROM with Strap  - 2-3 x daily - 7 x weekly - 1-2 sets - 10 reps - Seated Assisted Cervical Rotation with Towel  - 2-3 x daily - 7 x weekly - 1-2 sets - 10 reps  ASSESSMENT:  CLINICAL IMPRESSION: Patient continues to report ongoing neck pain though does show improving cervical AROM. Held manual per subjective report. Held upper trap stretch per report of "pinching". Added cervical isometrics. Updated HEP to reflect ther ex modifications today. Patient will continue  to benefit from skilled therapy services to reduce remaining deficits and improve functional ability.   OBJECTIVE IMPAIRMENTS decreased activity tolerance, decreased ROM, increased fascial restrictions, impaired flexibility, postural dysfunction, and pain.   ACTIVITY LIMITATIONS carrying, lifting, and reach over head  PARTICIPATION LIMITATIONS: meal prep, cleaning, laundry, driving, shopping, community activity, and yard work  PERSONAL FACTORS Time since onset of injury/illness/exacerbation are also affecting patient's functional outcome.   REHAB POTENTIAL: Good  CLINICAL DECISION MAKING: Stable/uncomplicated  EVALUATION COMPLEXITY: Low   GOALS: SHORT TERM GOALS: Target date: 01/01/2022  Patient will be independent with initial HEP and self-management strategies to improve functional outcomes Baseline:  Goal status: IN PROGRESS   LONG TERM GOALS: Target date: 01/22/2022  Patient will be independent with advanced HEP and self-management strategies to improve functional outcomes Baseline:  Goal status: IN PROGRESS  2.  Patient will improve FOTO score to predicted value to indicate  improvement in functional outcomes Baseline: 48% function  Goal status: IN PROGRESS  3.  Patient will demo improved bilateral cervical rotation by at least 10 degrees in order to improve ability to scan environment for safety and while driving. Baseline: See AROM  Goal status: IN PROGRESS  4. Patient will report a decrease in neck pain to no more than 3/10 with turning head for improved quality of life and ability to perform UE ADLs  Baseline: 7-8/10 Goal status: IN PROGRESS   5. Patient will report at least 50% overall improvement in subjective complaint of headaches to indicate improvement in QOL and ability to participate in ADLs.  Baseline:  Goal status: IN PROGRESS  PLAN: PT FREQUENCY: 1-2x/week  PT DURATION: 6 weeks  PLANNED INTERVENTIONS: Therapeutic exercises, Therapeutic activity,  Neuromuscular re-education, Balance training, Gait training, Patient/Family education, Joint manipulation, Joint mobilization, Stair training, Aquatic Therapy, Dry Needling, Electrical stimulation, Spinal manipulation, Spinal mobilization, Cryotherapy, Moist heat, scar mobilization, Taping, Traction, Ultrasound, Biofeedback, Ionotophoresis '4mg'$ /ml Dexamethasone, and Manual therapy.   PLAN FOR NEXT SESSION:  Progress postural strength and posturing.  Give pt massage therapist list. Thoracic mobility.  3:13 PM, 01/08/22 Josue Hector PT DPT  Physical Therapist with [facility]  [phone]

## 2022-01-14 ENCOUNTER — Ambulatory Visit (HOSPITAL_COMMUNITY): Payer: 59 | Admitting: Physical Therapy

## 2022-01-14 DIAGNOSIS — R928 Other abnormal and inconclusive findings on diagnostic imaging of breast: Secondary | ICD-10-CM | POA: Diagnosis not present

## 2022-01-14 DIAGNOSIS — M542 Cervicalgia: Secondary | ICD-10-CM

## 2022-01-14 NOTE — Therapy (Signed)
OUTPATIENT PHYSICAL THERAPY CERVICAL TREATMENT   Patient Name: Kendra Soto MRN: [id number] DOB:1975-06-07, 46 y.o., female Today's Date: 01/14/2022   PT End of Session - 01/14/22 1438     Visit Number 6    Number of Visits 12    Date for PT Re-Evaluation 01/22/22    Authorization Type [employer]    Progress Note Due on Visit 10    PT Start Time 1437    PT Stop Time 1515    PT Time Calculation (min) 38 min    Activity Tolerance Patient tolerated treatment well    Behavior During Therapy WFL for tasks assessed/performed              Past Medical History:  Diagnosis Date   Allergies    Asthma    Dysrhythmia    GERD (gastroesophageal reflux disease)    History of kidney stones    Hx of migraine headaches    Hypothyroid    Overactive bladder    Palpitations    Past Surgical History:  Procedure Laterality Date   ABDOMINAL HYSTERECTOMY     CHOLECYSTECTOMY  2016   COLONOSCOPY WITH PROPOFOL N/A 12/03/2021   Procedure: COLONOSCOPY WITH PROPOFOL;  Surgeon: Eloise Harman, DO;  [facility];  Service: Endoscopy;  Laterality: N/A;  10:30am, asa 2   ESOPHAGOGASTRODUODENOSCOPY N/A 04/30/2018   normal. Empiric dilatation   MALONEY DILATION N/A 04/30/2018   Procedure: Venia Minks DILATION;  Surgeon: Daneil Dolin, MD;  [facility];  Service: Endoscopy;  Laterality: N/A;   POLYPECTOMY  12/03/2021   Procedure: POLYPECTOMY;  Surgeon: Eloise Harman, DO;  [facility];  Service: Endoscopy;;   TUBAL LIGATION     VAGINAL HYSTERECTOMY N/A 01/22/2021   Procedure: HYSTERECTOMY VAGINAL;  Surgeon: Janyth Pupa, DO;  Location: [facility];  Service: Gynecology;  Laterality: N/A;   WISDOM TOOTH EXTRACTION     Patient Active Problem List   Diagnosis Date Noted   Rheumatoid arthritis ([place]) 04/12/2020   High risk medication use 04/12/2020   Chronic RUQ pain 04/16/2018   Abdominal pain, chronic, epigastric 04/16/2018   GERD (gastroesophageal reflux  disease) 04/16/2018   Esophageal dysphagia 04/16/2018    PCP: Stana Bunting MD  REFERRING PROVIDER: Genia Harold, MD   REFERRING DIAG: M54.2 (ICD-10-CM) - Cervicalgia   THERAPY DIAG:  Cervicalgia  Rationale for Evaluation and Treatment Rehabilitation  ONSET DATE: 7-8 years   SUBJECTIVE:  SUBJECTIVE STATEMENT: Feeling a little better. Exercises seem to help loosen up neck this morning. Still has some stiff spots.   PERTINENT HISTORY:  NA  PAIN:  Are you having pain? Yes: NPRS scale: 2/10 Pain location: Rt side neck  Pain description: throbbing, sore, aching, sharp   Aggravating factors: turning head, arm movement  Relieving factors: meds, rest, quiet/ dark places   PRECAUTIONS: None  WEIGHT BEARING RESTRICTIONS No  FALLS:  Has patient fallen in last 6 months? No  LIVING ENVIRONMENT: Lives with: lives with their family and lives with their spouse Lives in: House/apartment Stairs: Yes: External: 4 steps; can reach both Has following equipment at home: None  OCCUPATION: Unemployed   PLOF: Independent  PATIENT GOALS Find out whats wrong/ stop headaches   OBJECTIVE:   DIAGNOSTIC FINDINGS:  None   PATIENT SURVEYS:  FOTO 48% function    COGNITION: Overall cognitive status: Within functional limits for tasks assessed   SENSATION: WFL  POSTURE: rounded shoulders  PALPATION: Mod TTP about RT cervical paraspinals, sub occipitals    CERVICAL ROM:   Active ROM A/PROM (deg) eval AROM 01/08/22  Flexion 30   Extension 35   Right lateral flexion    Left lateral flexion    Right rotation 50 59  Left rotation 50 57   (Blank rows = not tested)  UPPER EXTREMITY ROM: Bilateral shoulder AROM WFL  UPPER EXTREMITY MMT:  MMT Right eval Left eval   Shoulder flexion 5 5  Shoulder extension    Shoulder abduction    Shoulder adduction    Shoulder extension    Shoulder internal rotation 5 5  Shoulder external rotation 5 5  Middle trapezius    Lower trapezius    Elbow flexion    Elbow extension    Wrist flexion    Wrist extension    Wrist ulnar deviation    Wrist radial deviation    Wrist pronation    Wrist supination    Grip strength     (Blank rows = not tested)    TODAY'S TREATMENT:  01/14/22 Supine: Chin tuck 10 x 3"  Scapular retraction 10 x 3" Cervical rotation AROM x10  Band ER RTB x20 Shoulder abduction RTB x20 Archers RTB x20 each  Open books 10 x 5" each   Standing:   Band row RTB 2 x 10  Shoulder extension RTB 2 x 10 Shoulder flexion/ scaption 2.5 lb x 10 each    01/08/22 Supine: Chin tuck 15 x 3"  Scapular retraction 15 x 3"   Cervical rotation AROM x10    Band ER YTB x20   Shoulder abduction YTB x20   Shoulder flexion stretch 10 x5"  Seated:  Bilateral SNAG rotation 10 x 3" each  Cervical sidebend iso 10x5"  Standing: Band row YTB 2 x 10  Shoulder extension YTB 2 x 10 Shoulder flexion/ scaption 1lb 2 x 10 each    01/01/22 Chin tuck 10 x 3"  Chin tuck with extension x10 Seated upper trap stretch 3 x30"  Bilateral SNAG rotation 10 x 3" each  SNAG cervical extension 10 x 3" Thoracic rotation seated x10 each  Band row RTB 2 x 10  Shoulder extension RTB 2 x 10  Manual STM to RT upper trap with active release during trap stretch   PATIENT EDUCATION:  Education details: on Eval findings, POC and HEP  Person educated: Patient Education method: Explanation Education comprehension: verbalized understanding    HOME EXERCISE PROGRAM: Access Code:  [email or web address] URL: [email or web address] 01/14/22 - Drawing Bow  - 2 x daily - 7 x weekly - 2 sets - 10 reps - Sidelying Thoracic and Shoulder Rotation  - 2 x daily - 7 x weekly - 1 sets - 10 reps - 5 sec hold  01/08/22 -  Seated Isometric Cervical Sidebending  - 1-2 x daily - 7 x weekly - 1 sets - 10 reps - 5 second hold - Supine Cervical Retraction with Towel  - 1-2 x daily - 7 x weekly - 2 sets - 10 reps - Supine Scapular Retraction  - 1-2 x daily - 7 x weekly - 2 sets - 10 reps - 5 sec hold - Supine Shoulder External Rotation with Resistance  - 1-2 x daily - 7 x weekly - 2 sets - 10 reps  01/01/22  Standing Shoulder Row with Anchored Resistance  - 1-2 x daily - 7 x weekly - 2 sets - 10 reps - Shoulder extension with resistance - Neutral  - 1-2 x daily - 7 x weekly - 2 sets - 10 reps  12/26/21:  Upper trap stretch 3x 30" Exercises - Seated Scapular Retraction  - 2-3 x daily - 7 x weekly - 1-2 sets - 10 reps - 5 seconds hold - Seated Passive Cervical Retraction  - 2-3 x daily - 7 x weekly - 1-2 sets - 10 reps  Added 12/19/21 =  - Cervical Extension AROM with Strap  - 2-3 x daily - 7 x weekly - 1-2 sets - 10 reps - Seated Assisted Cervical Rotation with Towel  - 2-3 x daily - 7 x weekly - 1-2 sets - 10 reps  ASSESSMENT:  CLINICAL IMPRESSION: Tolerated session well today with no increased complaint of pain. Progressed scapular/ postural strengthening with increase to red thera band. Patient educated on purpose and function of added activity. Noted restriction with LT thoracic rotation but improved with added open book stretch. Patient will continue to benefit from skilled therapy services to reduce remaining deficits and improve functional ability.    OBJECTIVE IMPAIRMENTS decreased activity tolerance, decreased ROM, increased fascial restrictions, impaired flexibility, postural dysfunction, and pain.   ACTIVITY LIMITATIONS carrying, lifting, and reach over head  PARTICIPATION LIMITATIONS: meal prep, cleaning, laundry, driving, shopping, community activity, and yard work  PERSONAL FACTORS Time since onset of injury/illness/exacerbation are also affecting patient's functional outcome.   REHAB POTENTIAL:  Good  CLINICAL DECISION MAKING: Stable/uncomplicated  EVALUATION COMPLEXITY: Low   GOALS: SHORT TERM GOALS: Target date: 01/01/2022  Patient will be independent with initial HEP and self-management strategies to improve functional outcomes Baseline:  Goal status: IN PROGRESS   LONG TERM GOALS: Target date: 01/22/2022  Patient will be independent with advanced HEP and self-management strategies to improve functional outcomes Baseline:  Goal status: IN PROGRESS  2.  Patient will improve FOTO score to predicted value to indicate improvement in functional outcomes Baseline: 48% function  Goal status: IN PROGRESS  3.  Patient will demo improved bilateral cervical rotation by at least 10 degrees in order to improve ability to scan environment for safety and while driving. Baseline: See AROM  Goal status: IN PROGRESS  4. Patient will report a decrease in neck pain to no more than 3/10 with turning head for improved quality of life and ability to perform UE ADLs  Baseline: 7-8/10 Goal status: IN PROGRESS   5. Patient will report at least 50% overall improvement in subjective complaint of headaches to indicate improvement in QOL and  ability to participate in ADLs.  Baseline:  Goal status: IN PROGRESS  PLAN: PT FREQUENCY: 1-2x/week  PT DURATION: 6 weeks  PLANNED INTERVENTIONS: Therapeutic exercises, Therapeutic activity, Neuromuscular re-education, Balance training, Gait training, Patient/Family education, Joint manipulation, Joint mobilization, Stair training, Aquatic Therapy, Dry Needling, Electrical stimulation, Spinal manipulation, Spinal mobilization, Cryotherapy, Moist heat, scar mobilization, Taping, Traction, Ultrasound, Biofeedback, Ionotophoresis '4mg'$ /ml Dexamethasone, and Manual therapy.   PLAN FOR NEXT SESSION:  Reassess. Progress postural strength and posturing. Thoracic mobility.  3:05 PM, 01/14/22 Josue Hector PT DPT  Physical Therapist with [facility]  [phone]

## 2022-01-19 ENCOUNTER — Other Ambulatory Visit: Payer: Self-pay | Admitting: Allergy & Immunology

## 2022-01-21 ENCOUNTER — Ambulatory Visit (HOSPITAL_COMMUNITY): Payer: 59 | Attending: Psychiatry | Admitting: Physical Therapy

## 2022-01-21 DIAGNOSIS — M542 Cervicalgia: Secondary | ICD-10-CM | POA: Insufficient documentation

## 2022-01-21 NOTE — Therapy (Signed)
OUTPATIENT PHYSICAL THERAPY CERVICAL TREATMENT   Patient Name: Kendra Soto MRN: [id number] DOB:Mar 13, 1976, 46 y.o., female Today's Date: 01/21/2022  PHYSICAL THERAPY DISCHARGE SUMMARY  Visits from Start of Care: 7  Current functional level related to goals / functional outcomes: See below   Remaining deficits: See below   Education / Equipment: See assessment    Patient agrees to discharge. Patient goals were partially met. Patient is being discharged due to maximized rehab potential.    PT End of Session - 01/21/22 1523     Visit Number 7    Number of Visits 12    Date for PT Re-Evaluation 01/22/22    Authorization Type [employer]    Progress Note Due on Visit 10    PT Start Time 1518    PT Stop Time 1548    PT Time Calculation (min) 30 min    Activity Tolerance Patient tolerated treatment well    Behavior During Therapy WFL for tasks assessed/performed              Past Medical History:  Diagnosis Date   Allergies    Asthma    Dysrhythmia    GERD (gastroesophageal reflux disease)    History of kidney stones    Hx of migraine headaches    Hypothyroid    Overactive bladder    Palpitations    Past Surgical History:  Procedure Laterality Date   ABDOMINAL HYSTERECTOMY     CHOLECYSTECTOMY  2016   COLONOSCOPY WITH PROPOFOL N/A 12/03/2021   Procedure: COLONOSCOPY WITH PROPOFOL;  Surgeon: Eloise Harman, DO;  [facility];  Service: Endoscopy;  Laterality: N/A;  10:30am, asa 2   ESOPHAGOGASTRODUODENOSCOPY N/A 04/30/2018   normal. Empiric dilatation   MALONEY DILATION N/A 04/30/2018   Procedure: Venia Minks DILATION;  Surgeon: Daneil Dolin, MD;  [facility];  Service: Endoscopy;  Laterality: N/A;   POLYPECTOMY  12/03/2021   Procedure: POLYPECTOMY;  Surgeon: Eloise Harman, DO;  [facility];  Service: Endoscopy;;   TUBAL LIGATION     VAGINAL HYSTERECTOMY N/A 01/22/2021   Procedure: HYSTERECTOMY VAGINAL;  Surgeon: Janyth Pupa, DO;  Location: [facility];  Service: Gynecology;  Laterality: N/A;   WISDOM TOOTH EXTRACTION     Patient Active Problem List   Diagnosis Date Noted   Rheumatoid arthritis ([place]) 04/12/2020   High risk medication use 04/12/2020   Chronic RUQ pain 04/16/2018   Abdominal pain, chronic, epigastric 04/16/2018   GERD (gastroesophageal reflux disease) 04/16/2018   Esophageal dysphagia 04/16/2018    PCP: Stana Bunting MD  REFERRING PROVIDER: Genia Harold, MD   REFERRING DIAG: M54.2 (ICD-10-CM) - Cervicalgia   THERAPY DIAG:  Cervicalgia  Rationale for Evaluation and Treatment Rehabilitation  ONSET DATE: 7-8 years   SUBJECTIVE:  SUBJECTIVE STATEMENT: Fells pain is better overall, not really having pain unless turning head to end range. Feels ROM is better, but still feels really tight   PERTINENT HISTORY:  NA  PAIN:  Are you having pain? Yes: NPRS scale: 0/10 Pain location: Rt side neck  Pain description: throbbing, sore, aching, sharp   Aggravating factors: turning head, arm movement  Relieving factors: meds, rest, quiet/ dark places   PRECAUTIONS: None  WEIGHT BEARING RESTRICTIONS No  FALLS:  Has patient fallen in last 6 months? No  LIVING ENVIRONMENT: Lives with: lives with their family and lives with their spouse Lives in: House/apartment Stairs: Yes: External: 4 steps; can reach both Has following equipment at home: None  OCCUPATION: Unemployed   PLOF: Independent  PATIENT GOALS Find out whats wrong/ stop headaches   OBJECTIVE:   DIAGNOSTIC FINDINGS:  None   PATIENT SURVEYS:  FOTO 49% (was 48% function)    COGNITION: Overall cognitive status: Within functional limits for tasks assessed   SENSATION: WFL  POSTURE: rounded  shoulders  PALPATION: Mod TTP about RT cervical paraspinals, sub occipitals    CERVICAL ROM:   Active ROM A/PROM (deg) eval AROM 01/08/22 AOM 01/21/22  Flexion 30  32  Extension 35  36  Right lateral flexion     Left lateral flexion     Right rotation 50 59 62  Left rotation 50 57 65   (Blank rows = not tested)  UPPER EXTREMITY ROM: Bilateral shoulder AROM WFL  UPPER EXTREMITY MMT:  MMT Right eval Left eval  Shoulder flexion 5 5  Shoulder extension    Shoulder abduction    Shoulder adduction    Shoulder extension    Shoulder internal rotation 5 5  Shoulder external rotation 5 5  Middle trapezius    Lower trapezius    Elbow flexion    Elbow extension    Wrist flexion    Wrist extension    Wrist ulnar deviation    Wrist radial deviation    Wrist pronation    Wrist supination    Grip strength     (Blank rows = not tested)    TODAY'S TREATMENT:  01/21/22 Reassess FOTO ROM  HEP review  01/14/22 Supine: Chin tuck 10 x 3"  Scapular retraction 10 x 3" Cervical rotation AROM x10  Band ER RTB x20 Shoulder abduction RTB x20 Archers RTB x20 each  Open books 10 x 5" each   Standing:   Band row RTB 2 x 10  Shoulder extension RTB 2 x 10 Shoulder flexion/ scaption 2.5 lb x 10 each   PATIENT EDUCATION:  Education details: on Eval findings, POC and HEP  Person educated: Patient Education method: Explanation Education comprehension: verbalized understanding    HOME EXERCISE PROGRAM: Access Code: [email or web address] URL: [email or web address] 01/14/22 - Drawing Bow  - 2 x daily - 7 x weekly - 2 sets - 10 reps - Sidelying Thoracic and Shoulder Rotation  - 2 x daily - 7 x weekly - 1 sets - 10 reps - 5 sec hold  01/08/22 - Seated Isometric Cervical Sidebending  - 1-2 x daily - 7 x weekly - 1 sets - 10 reps - 5 second hold - Supine Cervical Retraction with Towel  - 1-2 x daily - 7 x weekly - 2 sets - 10 reps - Supine Scapular Retraction  - 1-2 x daily  - 7 x weekly - 2 sets - 10 reps - 5 sec hold - Supine Shoulder External Rotation with  Resistance  - 1-2 x daily - 7 x weekly - 2 sets - 10 reps  01/01/22  Standing Shoulder Row with Anchored Resistance  - 1-2 x daily - 7 x weekly - 2 sets - 10 reps - Shoulder extension with resistance - Neutral  - 1-2 x daily - 7 x weekly - 2 sets - 10 reps  12/26/21:  Upper trap stretch 3x 30" Exercises - Seated Scapular Retraction  - 2-3 x daily - 7 x weekly - 1-2 sets - 10 reps - 5 seconds hold - Seated Passive Cervical Retraction  - 2-3 x daily - 7 x weekly - 1-2 sets - 10 reps  Added 12/19/21 =  - Cervical Extension AROM with Strap  - 2-3 x daily - 7 x weekly - 1-2 sets - 10 reps - Seated Assisted Cervical Rotation with Towel  - 2-3 x daily - 7 x weekly - 1-2 sets - 10 reps  ASSESSMENT:  CLINICAL IMPRESSION: Patient has made moderate progress since initial evaluation. She has significant improvement in cervical rotation as well as pain at rest. She has not had much change in FOTO score but does note improvement in overall function. She continues to be limited in Cervical extension/ flexion and reports ongoing pain at end range, and has ongoing fascial restrictions that are relatively unchanged despite manual therapies performed. At this point, patient will be DC to return to referring provider for further evaluation. Reviewed comprehsive HEP and answered all patient questions. Patient encouraged to follow up with therapy services with any further questions or concerns.   OBJECTIVE IMPAIRMENTS decreased activity tolerance, decreased ROM, increased fascial restrictions, impaired flexibility, postural dysfunction, and pain.   ACTIVITY LIMITATIONS carrying, lifting, and reach over head  PARTICIPATION LIMITATIONS: meal prep, cleaning, laundry, driving, shopping, community activity, and yard work  PERSONAL FACTORS Time since onset of injury/illness/exacerbation are also affecting patient's functional  outcome.   REHAB POTENTIAL: Good  CLINICAL DECISION MAKING: Stable/uncomplicated  EVALUATION COMPLEXITY: Low   GOALS: SHORT TERM GOALS: Target date: 01/01/2022  Patient will be independent with initial HEP and self-management strategies to improve functional outcomes Baseline:  Goal status: MET   LONG TERM GOALS: Target date: 01/22/2022  Patient will be independent with advanced HEP and self-management strategies to improve functional outcomes Baseline:  Goal status: MET  2.  Patient will improve FOTO score to predicted value to indicate improvement in functional outcomes Baseline: 49% function  Goal status: NOT MET  3.  Patient will demo improved bilateral cervical rotation by at least 10 degrees in order to improve ability to scan environment for safety and while driving. Baseline: See AROM  Goal status: MET  4. Patient will report a decrease in neck pain to no more than 3/10 with turning head for improved quality of life and ability to perform UE ADLs  Baseline: 8/10 Goal status: NOT MET   5. Patient will report at least 50% overall improvement in subjective complaint of headaches to indicate improvement in QOL and ability to participate in ADLs.  Baseline: Reports 70-75% improved Goal status: MET  PLAN: PT FREQUENCY: 1-2x/week  PT DURATION: 6 weeks  PLANNED INTERVENTIONS: Therapeutic exercises, Therapeutic activity, Neuromuscular re-education, Balance training, Gait training, Patient/Family education, Joint manipulation, Joint mobilization, Stair training, Aquatic Therapy, Dry Needling, Electrical stimulation, Spinal manipulation, Spinal mobilization, Cryotherapy, Moist heat, scar mobilization, Taping, Traction, Ultrasound, Biofeedback, Ionotophoresis 45m/ml Dexamethasone, and Manual therapy.   PLAN FOR NEXT SESSION:  DC to HEP   3:50 PM, 01/21/22  Josue Hector PT DPT  Physical Therapist with [facility]  [phone]

## 2022-02-03 ENCOUNTER — Encounter: Payer: Self-pay | Admitting: Adult Health

## 2022-02-03 ENCOUNTER — Ambulatory Visit: Payer: 59 | Admitting: Adult Health

## 2022-02-03 ENCOUNTER — Telehealth: Payer: Self-pay | Admitting: Adult Health

## 2022-02-03 VITALS — BP 102/66 | HR 68 | Ht 65.0 in | Wt 210.0 lb

## 2022-02-03 DIAGNOSIS — M542 Cervicalgia: Secondary | ICD-10-CM

## 2022-02-03 DIAGNOSIS — R200 Anesthesia of skin: Secondary | ICD-10-CM | POA: Diagnosis not present

## 2022-02-03 DIAGNOSIS — G43119 Migraine with aura, intractable, without status migrainosus: Secondary | ICD-10-CM

## 2022-02-03 MED ORDER — AIMOVIG 140 MG/ML ~~LOC~~ SOAJ: 140.0000 mg | SUBCUTANEOUS | 11 refills | Status: DC

## 2022-02-03 MED ORDER — NURTEC 75 MG PO TBDP: ORAL_TABLET | ORAL | 11 refills | Status: DC

## 2022-02-03 NOTE — Progress Notes (Signed)
PATIENT: Kendra Soto DOB: [birth year]  REASON FOR VISIT: follow up HISTORY FROM: patient PRIMARY NEUROLOGIST: Dr. Billey Gosling  Chief Complaint  Patient presents with   Follow-up    Pt in 7 Pt here for Migraine f/u Pt states migraine everyday in the last week Pt states no migraine this am Pt states took PT and she wants to know if she qualifies for MRI brain and cervical      HISTORY OF PRESENT ILLNESS: Today 02/03/22  Kendra Soto is a 46 y.o. female who has been followed in this office for migraine headaches.. Returns today for follow-up.  She was originally prescribed Lenoria Chime however her insurance would not cover this.  She is now on Aimovig 70 mg monthly injection.  Reports that it has helped. But the last 1.5 weeks has been getting a headache daily. Left side and starts at the base of the neck and moves to the front. Some nausea and vomiting with severe headaches. Photophobia and phonophobia. Reports sister has a history of chiari malformation.  Patient was sent to physical therapy for neck pain and paresthesias radiating down both arms.  She reports that it has persisted. Some improvement with ROM. Also notices a tremor in both arms with tedious activities such as eating, tying shoes. No family history of tremor.   Insurance only approved MRI of the brain did not approve cervical.  HISTORY Medical co-morbidities: asthma, hypothyroidism, prediabetes, PVCs, kidney stones   The patient presents for evaluation of headaches which began in childhood but have been worsening recently. She has had headaches daily for the past weeks. No clear onset for worsening of her headaches. Headaches are described as sharp or throbbing pain from her left eye radiating to her occiput. They are associated with photophobia, phonophobia, nausea, and vomiting. Sometimes gets paresthesias on the left side of her scalp as well.    She has noticed significant neck pain, and headaches can be triggered by turning her  neck L>R. Pain will radiate from her neck down her arms on both sides. Arms will go numb when she is driving and has them resting on the steering wheel. Feels her grip strength has worsened bilaterally and has been dropping things more frequently.    Lorrin Goodell and Excedrin as needed. Ubrelvy 100 mg helps some of the time and she almost always has to take a second dose. It takes several hours to start working.   She is currently undergoing workup for arrhythmias. Is planned to wear a heart monitor for the next 2 weeks.   Headache History: Onset: childhood Triggers: turning her neck L>R, salty food Aura: sparkles (rare) Location: left side, occiput, retro-orbital Quality/Description: sharp, throbbing Associated Symptoms:             Photophobia: yes             Phonophobia: yes             Nausea: yes Vomiting: yes Worse with activity?: yes Duration of headaches: up to 24 hours   Headache days per month: 21 Headache free days per month: 9   Current Treatment: Abortive Ubrelvy 100 mg PRN Excedrin   Preventative none   Prior Therapies                                 Metoprolol Topamax - contraindicated due to kidney stones Excedrin Imitrex 100 mg PRN - helped but caused neck and  jaw pain Maxalt - lack of efficacy Ubrelvy 100 mg PRN    REVIEW OF SYSTEMS: Out of a complete 14 system review of symptoms, the patient complains only of the following symptoms, and all other reviewed systems are negative.  ALLERGIES: Allergies  Allergen Reactions   Bee Pollen Cough, Other (See Comments) and Shortness Of Breath   Short Ragweed Pollen Ext Cough, Other (See Comments) and Shortness Of Breath    HOME MEDICATIONS: Outpatient Medications Prior to Visit  Medication Sig Dispense Refill   albuterol (VENTOLIN HFA) 108 (90 Base) MCG/ACT inhaler Inhale 2 puffs into the lungs every 6 (six) hours as needed for wheezing or shortness of breath. 18 g 1   Azelastine-Fluticasone 137-50  MCG/ACT SUSP USE ONE SPRAY IN EACH NOSTRIL TWICE DAILY 23 g 1   clobetasol ointment (TEMOVATE) 6.27 % Apply 1 Application topically 2 (two) times daily as needed (irritation).     Erenumab-aooe (AIMOVIG) 70 MG/ML SOAJ Inject 70 mg into the skin every 30 (thirty) days. 1.12 mL 6   flecainide (TAMBOCOR) 50 MG tablet Take 1 tablet (50 mg total) by mouth 2 (two) times daily. 180 tablet 3   fluticasone 0.05%-ketoconazole 2% 1:1 cream mixture Apply 1 application  topically daily as needed for irritation.     fluticasone-salmeterol (ADVAIR) 500-50 MCG/ACT AEPB INHALE 1 PUFF INTO THE LUNGS EVERY MORNING AND AT BEDTIME 60 each 0   ipratropium (ATROVENT) 0.03 % nasal spray 1 spray per nostril 2-3 times daily as needed. 30 mL 5   levocetirizine (XYZAL) 5 MG tablet Take 1 tablet (5 mg total) by mouth every evening. 30 tablet 5   levothyroxine (SYNTHROID) 75 MCG tablet Take 75 mcg by mouth daily before breakfast.     montelukast (SINGULAIR) 10 MG tablet TAKE 1 TABLET BY MOUTH AT BEDTIME 30 tablet 2   omeprazole (PRILOSEC) 40 MG capsule Take 40 mg by mouth daily.     oxybutynin (DITROPAN-XL) 10 MG 24 hr tablet Take 10 mg by mouth every evening.     UBRELVY 100 MG TABS Take 100 mg by mouth daily as needed for migraine.     zolmitriptan (ZOMIG) 5 MG tablet Take 1 tablet (5 mg total) by mouth as needed for migraine. May repeat a dose in 2 hours if headache persists 10 tablet 6   No facility-administered medications prior to visit.    PAST MEDICAL HISTORY: Past Medical History:  Diagnosis Date   Allergies    Asthma    Dysrhythmia    GERD (gastroesophageal reflux disease)    History of kidney stones    Hx of migraine headaches    Hypothyroid    Overactive bladder    Palpitations     PAST SURGICAL HISTORY: Past Surgical History:  Procedure Laterality Date   ABDOMINAL HYSTERECTOMY     CHOLECYSTECTOMY  2016   COLONOSCOPY WITH PROPOFOL N/A 12/03/2021   Procedure: COLONOSCOPY WITH PROPOFOL;  Surgeon:  Eloise Harman, DO;  [facility];  Service: Endoscopy;  Laterality: N/A;  10:30am, asa 2   ESOPHAGOGASTRODUODENOSCOPY N/A 04/30/2018   normal. Empiric dilatation   MALONEY DILATION N/A 04/30/2018   Procedure: Venia Minks DILATION;  Surgeon: Daneil Dolin, MD;  [facility];  Service: Endoscopy;  Laterality: N/A;   POLYPECTOMY  12/03/2021   Procedure: POLYPECTOMY;  Surgeon: Eloise Harman, DO;  [facility];  Service: Endoscopy;;   TUBAL LIGATION     VAGINAL HYSTERECTOMY N/A 01/22/2021   Procedure: HYSTERECTOMY VAGINAL;  Surgeon: Janyth Pupa, DO;  Location: [facility];  Service: Gynecology;  Laterality: N/A;   WISDOM TOOTH EXTRACTION      FAMILY HISTORY: Family History  Problem Relation Age of Onset   Hypertension Mother    Allergic rhinitis Father    Cancer Father        prostate   Hypertension Father    Rheum arthritis Sister    Allergic rhinitis Brother    Colon cancer Neg Hx    Celiac disease Neg Hx    Crohn's disease Neg Hx    Colon polyps Neg Hx     SOCIAL HISTORY: Social History   Socioeconomic History   Marital status: Married    Spouse name: Not on file   Number of children: 3   Years of education: Not on file   Highest education level: Not on file  Occupational History   Not on file  Tobacco Use   Smoking status: Former    Packs/day: 1.00    Years: 20.00    Total pack years: 20.00    Types: Cigarettes    Quit date: 2009    Years since quitting: 14.8   Smokeless tobacco: Never  Vaping Use   Vaping Use: Never used  Substance and Sexual Activity   Alcohol use: No   Drug use: No   Sexual activity: Yes    Birth control/protection: Surgical    Comment: hyst  Other Topics Concern   Not on file  Social History Narrative   Not on file   Social Determinants of Health   Financial Resource Strain: Low Risk  (05/08/2020)   Overall Financial Resource Strain (CARDIA)    Difficulty of Paying Living Expenses: Not very hard   Food Insecurity: Food Insecurity Present (05/08/2020)   Hunger Vital Sign    Worried About Running Out of Food in the Last Year: Often true    Ran Out of Food in the Last Year: Never true  Transportation Needs: No Transportation Needs (05/08/2020)   PRAPARE - Hydrologist (Medical): No    Lack of Transportation (Non-Medical): No  Physical Activity: Inactive (05/08/2020)   Exercise Vital Sign    Days of Exercise per Week: 0 days    Minutes of Exercise per Session: 0 min  Stress: No Stress Concern Present (05/08/2020)   [place]    Feeling of Stress : Only a little  Social Connections: Moderately Isolated (05/08/2020)   Social Connection and Isolation Panel [NHANES]    Frequency of Communication with Friends and Family: Once a week    Frequency of Social Gatherings with Friends and Family: Once a week    Attends Religious Services: More than 4 times per year    Active Member of [employer] or Organizations: No    Attends Archivist Meetings: Never    Marital Status: Married  Human resources officer Violence: Not At Risk (05/08/2020)   Humiliation, Afraid, Rape, and Kick questionnaire    Fear of Current or Ex-Partner: No    Emotionally Abused: No    Physically Abused: No    Sexually Abused: No      PHYSICAL EXAM  Vitals:   02/03/22 1125  BP: 102/66  Pulse: 68  Weight: 210 lb (95.3 kg)  Height: '5\' 5"'$  (1.651 m)   Body mass index is 34.95 kg/m.  Generalized: Well developed, in no acute distress   Neurological examination  Mentation: Alert oriented to time,  place, history taking. Follows all commands speech and language fluent Cranial nerve II-XII: Pupils were equal round reactive to light. Extraocular movements were full, visual field were full on confrontational test. Facial sensation and strength were normal.  Head turning and shoulder shrug  were normal and symmetric. Motor: The  motor testing reveals 5 over 5 strength of all 4 extremities. Good symmetric motor tone is noted throughout.  Sensory: Sensory testing is intact to soft touch on all 4 extremities. No evidence of extinction is noted.  Coordination: Cerebellar testing reveals good finger-nose-finger and heel-to-shin bilaterally.  Gait and station: Gait is normal.  Reflexes: Deep tendon reflexes are symmetric and normal bilaterally.   DIAGNOSTIC DATA (LABS, IMAGING, TESTING) - I reviewed patient records, labs, notes, testing and imaging myself where available.  Lab Results  Component Value Date   WBC 10.0 06/10/2021   HGB 12.7 06/10/2021   HCT 36.5 06/10/2021   MCV 92 06/10/2021   PLT 288 06/10/2021      Component Value Date/Time   NA 138 01/18/2021 1038   K 3.4 (L) 01/18/2021 1038   CL 107 01/18/2021 1038   CO2 23 01/18/2021 1038   GLUCOSE 83 01/18/2021 1038   BUN 17 01/18/2021 1038   CREATININE 0.68 01/18/2021 1038   CALCIUM 8.8 (L) 01/18/2021 1038   GFRNONAA >60 01/18/2021 1038     ASSESSMENT AND PLAN 46 y.o. year old female  has a past medical history of Allergies, Asthma, Dysrhythmia, GERD (gastroesophageal reflux disease), History of kidney stones, migraine headaches, Hypothyroid, Overactive bladder, and Palpitations. here with:  Migraine headaches Neck pain with paresthesias both arms  - Increase Aimovig 140 mg monthly injection  - Stop Zomig and Ubrelvy  - Start Nurtec 75 mg daily. 1 tab in 24 hours. For abortive measures - reorder MRI brain and cervical spine  - FU in 6 months.      Ward Givens, MSN, NP-C 02/03/2022, 11:23 AM [facility] [address], [place], [place] [address] [phone]

## 2022-02-03 NOTE — Telephone Encounter (Signed)
Previous MRIs were not authorized because insurance was requiring PT first. The patient has completed 6 weeks of PT and is still in pain. I sent a message to GI asking them to try to get the MRI authorized again and get her scheduled. [phone]

## 2022-02-03 NOTE — Patient Instructions (Signed)
-   increase Aimovig to 140 mg monthly  - stop Zomig and ubrelvy  - Start nurtec 75 mg. Take at the onset of migraine. 1 tab in 24 hours - will check on MRI orders

## 2022-02-12 ENCOUNTER — Encounter: Payer: Self-pay | Admitting: Psychiatry

## 2022-02-14 ENCOUNTER — Telehealth: Payer: Self-pay

## 2022-02-14 ENCOUNTER — Encounter: Payer: Self-pay | Admitting: Allergy & Immunology

## 2022-02-14 ENCOUNTER — Other Ambulatory Visit (HOSPITAL_COMMUNITY): Payer: Self-pay

## 2022-02-14 ENCOUNTER — Ambulatory Visit: Payer: 59 | Admitting: Family Medicine

## 2022-02-14 ENCOUNTER — Encounter: Payer: Self-pay | Admitting: Psychiatry

## 2022-02-14 VITALS — BP 110/64 | HR 86 | Temp 98.0°F | Resp 20 | Ht 65.5 in | Wt 214.5 lb

## 2022-02-14 DIAGNOSIS — J4540 Moderate persistent asthma, uncomplicated: Secondary | ICD-10-CM

## 2022-02-14 DIAGNOSIS — J3089 Other allergic rhinitis: Secondary | ICD-10-CM

## 2022-02-14 DIAGNOSIS — K219 Gastro-esophageal reflux disease without esophagitis: Secondary | ICD-10-CM | POA: Diagnosis not present

## 2022-02-14 DIAGNOSIS — J302 Other seasonal allergic rhinitis: Secondary | ICD-10-CM

## 2022-02-14 MED ORDER — ALBUTEROL SULFATE HFA 108 (90 BASE) MCG/ACT IN AERS: 2.0000 | INHALATION_SPRAY | Freq: Four times a day (QID) | RESPIRATORY_TRACT | 1 refills | Status: DC | PRN

## 2022-02-14 MED ORDER — AZELASTINE-FLUTICASONE 137-50 MCG/ACT NA SUSP: 1.0000 | Freq: Two times a day (BID) | NASAL | 5 refills | Status: DC

## 2022-02-14 MED ORDER — OMEPRAZOLE 40 MG PO CPDR: 40.0000 mg | DELAYED_RELEASE_CAPSULE | Freq: Every morning | ORAL | 5 refills | Status: DC

## 2022-02-14 MED ORDER — LEVOCETIRIZINE DIHYDROCHLORIDE 5 MG PO TABS: 5.0000 mg | ORAL_TABLET | Freq: Every day | ORAL | 5 refills | Status: DC | PRN

## 2022-02-14 MED ORDER — SPACER/AERO-HOLDING CHAMBERS DEVI: 1.0000 | 1 refills | Status: AC

## 2022-02-14 MED ORDER — FAMOTIDINE 40 MG PO TABS: 40.0000 mg | ORAL_TABLET | Freq: Every evening | ORAL | 5 refills | Status: DC

## 2022-02-14 MED ORDER — BREZTRI AEROSPHERE 160-9-4.8 MCG/ACT IN AERO: 2.0000 | INHALATION_SPRAY | Freq: Two times a day (BID) | RESPIRATORY_TRACT | 5 refills | Status: DC

## 2022-02-14 MED ORDER — ALBUTEROL SULFATE (2.5 MG/3ML) 0.083% IN NEBU: 2.5000 mg | INHALATION_SOLUTION | RESPIRATORY_TRACT | 1 refills | Status: DC | PRN

## 2022-02-14 MED ORDER — NEBULIZER MASK ADULT MISC: 1.0000 | 1 refills | Status: DC

## 2022-02-14 NOTE — Telephone Encounter (Signed)
Trelegy is preferred over Breztri by patients plan

## 2022-02-14 NOTE — Patient Instructions (Addendum)
Asthma Continue montelukast 10 mg once a day to prevent cough or wheeze Begin Breztri 2 puffs twice a day with a spacer to prevent cough or wheeze.  This will replace Advair Continue albuterol 2 puffs every 4 hours as needed for cough or wheeze OR Instead use albuterol 0.083% solution via nebulizer one unit vial every 4 hours as needed for cough or wheeze Consider a biologic therapy to control your asthma symptoms.  Written information provided for Fasenra  Allergic rhinitis Continue allergen avoidance measures directed toward grass pollen, tree pollen, mold, dust mite, and cat as listed below Continue Xyzal 5 mg once a day as needed for runny nose or itch Continue Dymista up to twice a day as needed for nasal symptoms Continue Atrovent 2 sprays in each nostril twice a day as needed for over runny nose Consider saline nasal rinses as needed for nasal symptoms. Use this before any medicated nasal sprays for best result Consider allergen immunotherapy if your symptoms are not well controlled with the treatment plan as listed above.  Written information provided for traditional injections and for RUSH injections  Reflux Continue dietary and lifestyle modifications as listed below Continue omeprazole once a day to control reflux.  Take this medication 30 minutes before your first meal for best results Begin famotidine 40 mg every evening to prevent reflux  Call the clinic if this treatment plan is not working well for you.  Follow up in 3 months or sooner if needed.  Reducing Pollen Exposure The American Academy of Allergy, Asthma and Immunology suggests the following steps to reduce your exposure to pollen during allergy seasons. Do not hang sheets or clothing out to dry; pollen may collect on these items. Do not mow lawns or spend time around freshly cut grass; mowing stirs up pollen. Keep windows closed at night.  Keep car windows closed while driving. Minimize morning activities outdoors,  a time when pollen counts are usually at their highest. Stay indoors as much as possible when pollen counts or humidity is high and on windy days when pollen tends to remain in the air longer. Use air conditioning when possible.  Many air conditioners have filters that trap the pollen spores. Use a HEPA room air filter to remove pollen form the indoor air you breathe.  Control of Dog or Cat Allergen Avoidance is the best way to manage a dog or cat allergy. If you have a dog or cat and are allergic to dog or cats, consider removing the dog or cat from the home. If you have a dog or cat but don't want to find it a new home, or if your family wants a pet even though someone in the household is allergic, here are some strategies that may help keep symptoms at bay:  Keep the pet out of your bedroom and restrict it to only a few rooms. Be advised that keeping the dog or cat in only one room will not limit the allergens to that room. Don't pet, hug or kiss the dog or cat; if you do, wash your hands with soap and water. High-efficiency particulate air (HEPA) cleaners run continuously in a bedroom or living room can reduce allergen levels over time. Regular use of a high-efficiency vacuum cleaner or a central vacuum can reduce allergen levels. Giving your dog or cat a bath at least once a week can reduce airborne allergen.   Control of Dust Mite Allergen Dust mites play a major role in allergic asthma and  rhinitis. They occur in environments with high humidity wherever human skin is found. Dust mites absorb humidity from the atmosphere (ie, they do not drink) and feed on organic matter (including shed human and animal skin). Dust mites are a microscopic type of insect that you cannot see with the naked eye. High levels of dust mites have been detected from mattresses, pillows, carpets, upholstered furniture, bed covers, clothes, soft toys and any woven material. The principal allergen of the dust mite is  found in its feces. A gram of dust may contain 1,000 mites and 250,000 fecal particles. Mite antigen is easily measured in the air during house cleaning activities. Dust mites do not bite and do not cause harm to humans, other than by triggering allergies/asthma.  Ways to decrease your exposure to dust mites in your home:  1. Encase mattresses, box springs and pillows with a mite-impermeable barrier or cover  2. Wash sheets, blankets and drapes weekly in hot water (130 F) with detergent and dry them in a dryer on the hot setting.  3. Have the room cleaned frequently with a vacuum cleaner and a damp dust-mop. For carpeting or rugs, vacuuming with a vacuum cleaner equipped with a high-efficiency particulate air (HEPA) filter. The dust mite allergic individual should not be in a room which is being cleaned and should wait 1 hour after cleaning before going into the room.  4. Do not sleep on upholstered furniture (eg, couches).  5. If possible removing carpeting, upholstered furniture and drapery from the home is ideal. Horizontal blinds should be eliminated in the rooms where the person spends the most time (bedroom, study, television room). Washable vinyl, roller-type shades are optimal.  6. Remove all non-washable stuffed toys from the bedroom. Wash stuffed toys weekly like sheets and blankets above.  7. Reduce indoor humidity to less than 50%. Inexpensive humidity monitors can be purchased at most hardware stores. Do not use a humidifier as can make the problem worse and are not recommended.

## 2022-02-14 NOTE — Progress Notes (Signed)
[facility], [address] [place] [address] Dept: [phone]  FOLLOW UP NOTE  Patient ID: Kendra Soto, female    DOB: [birth year]  Age: 46 y.o. MRN: [id number] Date of Office Visit: 02/14/2022  Assessment  Chief Complaint: Asthma (Uses albuterol before using advair- everyday ), Allergic Rhinitis  (Some issues-breakthrough drainage ), and Gastroesophageal Reflux (Reflux causes dry throat, hoarseness. Has to moisturize her throat )  HPI Kendra Soto is a 46 year old female who presents to the clinic for a follow up visit. She was last seen in this clinic on 08/09/2021 by Dr. Ernst Bowler for evaluation of asthma, allergic rhinitis, and reflux. Her problem list is also significant for rheumatoid arthritis which is managed by Dr. Benjamine Mola. At today's visit, she reports that her asthma has been moderately well controlled with symptoms including shortness of breath with activity, occasional wheeze, and frequent cough producing thick mucus ranging in color from light yellow to green. She continues Advair 500-1 puff twice a day, montelukast 10 mg once a day, and is using albuterol twice a day before each Adviar dose to ensure popper deposition of Advair. Chart review indicates absolute eosinophil count 200 on 06/10/2021. Allergic rhinitis is reported as moderately well controlled with symptoms including clear rhinorrhea when the weather is cold, nasal congestion which alternates between nostrils, occasional sneezing, and thick post nasal drainage with morning coughing and frequent throat cleaning. She continues Dymista and Atrovent daily with mild relief of symptoms. Reflux is reported as moderately well controlled with frequent heartburn and voice hoarseness. She currently sings in the church choir. She continues omeprazole 40 mg once a day at least 30 minutes before breakfast. Her current medications are listed in the chart.   Drug Allergies:  Allergies  Allergen Reactions   Bee Pollen Cough, Other  (See Comments) and Shortness Of Breath   Short Ragweed Pollen Ext Cough, Other (See Comments) and Shortness Of Breath    Physical Exam: BP 110/64   Pulse 86   Temp 98 F (36.7 C)   Resp 20   Ht 5' 5.5" (1.664 m)   Wt 214 lb 8 oz (97.3 kg)  SpO2 97%   BMI 35.15 kg/m    Physical Exam Vitals reviewed.  Constitutional:      Appearance: Normal appearance.  HENT:     Head: Normocephalic and atraumatic.     Right Ear: Tympanic membrane normal.     Left Ear: Tympanic membrane normal.     Nose:     Comments: Bilateral nares slightly erythematous with clear nasal drainage noted. Pharynx slightly erythematous with no exudate. Ears normal. Eyes normal. Eyes:     Conjunctiva/sclera: Conjunctivae normal.  Cardiovascular:     Rate and Rhythm: Normal rate and regular rhythm.     Heart sounds: Normal heart sounds. No murmur heard. Pulmonary:     Effort: Pulmonary effort is normal.     Breath sounds: Normal breath sounds.     Comments: Lungs clear to auscultation Musculoskeletal:        General: Normal range of motion.     Cervical back: Normal range of motion and neck supple.  Skin:    General: Skin is warm and dry.  Neurological:     Mental Status: She is alert and oriented to person, place, and time.  Psychiatric:        Mood and Affect: Mood normal.        Behavior: Behavior normal.        Thought Content:  Thought content normal.        Judgment: Judgment normal.     Diagnostics: FVC 3.17, FEV1 2.68. Predicted FVC 3.64, predicted FEV1 2.92. Spirometry indicates normal ventilatory function.   Assessment and Plan: 1. Not well controlled moderate persistent asthma   2. Seasonal and perennial allergic rhinitis   3. Gastroesophageal reflux disease, unspecified whether esophagitis present     Meds ordered this encounter  Medications   Budeson-Glycopyrrol-Formoterol (BREZTRI AEROSPHERE) 160-9-4.8 MCG/ACT AERO    Sig: Inhale 2 puffs into the lungs in the morning and at  bedtime.    Dispense:  10.7 g    Refill:  5   Spacer/Aero-Holding Chambers DEVI    Sig: 1 Device by Does not apply route as directed.    Dispense:  1 each    Refill:  1   albuterol (VENTOLIN HFA) 108 (90 Base) MCG/ACT inhaler    Sig: Inhale 2 puffs into the lungs every 6 (six) hours as needed for wheezing or shortness of breath.    Dispense:  18 g    Refill:  1   albuterol (PROVENTIL) (2.5 MG/3ML) 0.083% nebulizer solution    Sig: Take 3 mLs (2.5 mg total) by nebulization every 4 (four) hours as needed for wheezing or shortness of breath.    Dispense:  150 mL    Refill:  1   Respiratory Therapy Supplies (NEBULIZER MASK ADULT) MISC    Sig: 1 Device by Does not apply route as directed.    Dispense:  1 each    Refill:  1   levocetirizine (XYZAL) 5 MG tablet    Sig: Take 1 tablet (5 mg total) by mouth daily as needed for allergies (Can take an extra dose during flare ups.).    Dispense:  60 tablet    Refill:  5   Azelastine-Fluticasone 137-50 MCG/ACT SUSP    Sig: Place 1 spray into both nostrils 2 (two) times daily.    Dispense:  23 g    Refill:  5   omeprazole (PRILOSEC) 40 MG capsule    Sig: Take 1 capsule (40 mg total) by mouth in the morning.    Dispense:  30 capsule    Refill:  5   famotidine (PEPCID) 40 MG tablet    Sig: Take 1 tablet (40 mg total) by mouth at bedtime.    Dispense:  30 tablet    Refill:  5    Patient Instructions  Asthma Continue montelukast 10 mg once a day to prevent cough or wheeze Begin Breztri 2 puffs twice a day with a spacer to prevent cough or wheeze.  This will replace Advair Continue albuterol 2 puffs every 4 hours as needed for cough or wheeze OR Instead use albuterol 0.083% solution via nebulizer one unit vial every 4 hours as needed for cough or wheeze Consider a biologic therapy to control your asthma symptoms.  Written information provided for Fasenra  Allergic rhinitis Continue allergen avoidance measures directed toward grass pollen,  tree pollen, mold, dust mite, and cat as listed below Continue Xyzal 5 mg once a day as needed for runny nose or itch Continue Dymista up to twice a day as needed for nasal symptoms Continue Atrovent 2 sprays in each nostril twice a day as needed for over runny nose Consider saline nasal rinses as needed for nasal symptoms. Use this before any medicated nasal sprays for best result Consider allergen immunotherapy if your symptoms are not well controlled with the treatment plan as  listed above.  Written information provided for traditional injections and for RUSH injections  Reflux Continue dietary and lifestyle modifications as listed below Continue omeprazole once a day to control reflux.  Take this medication 30 minutes before your first meal for best results Begin famotidine 40 mg every evening to prevent reflux  Call the clinic if this treatment plan is not working well for you.  Follow up in 3 months or sooner if needed.   Return in about 3 months (around 05/16/2022), or if symptoms worsen or fail to improve.    Thank you for the opportunity to care for this patient.  Please do not hesitate to contact me with questions.  Gareth Morgan, FNP Allergy and [place] [facility] [place]

## 2022-02-14 NOTE — Telephone Encounter (Signed)
PA request received through [facility] for Breztri Aerosphere 160-9-4.8MCG/ACT aerosol from [employer].  PA not submitted.  Per benefits investigation Trelegy is covered under the patients current plan.  Please advise whether med change to Trelegy is appropriate or to continue processing PA.

## 2022-02-18 ENCOUNTER — Telehealth: Payer: Self-pay | Admitting: Neurology

## 2022-02-18 NOTE — Telephone Encounter (Signed)
PA completed on CMM/ aetna for [employer] [id number] Will await determination

## 2022-02-18 NOTE — Telephone Encounter (Signed)
Lexia is very cost averse. With the [facility] copay card, I think this will be cheaper for her. Thanks!   Salvatore Marvel, MD Allergy and [place] [facility] [place]

## 2022-02-19 NOTE — Telephone Encounter (Signed)
PA approved for the patient until 02/19/2023 through [facility] [employer].

## 2022-02-28 ENCOUNTER — Encounter: Payer: Self-pay | Admitting: Allergy & Immunology

## 2022-03-03 NOTE — Telephone Encounter (Signed)
Addressed in additional encounter. Trelegy is preferred however per Dr. Ernst Bowler patient should have co-pay card for [facility].

## 2022-03-05 ENCOUNTER — Encounter: Payer: Self-pay | Admitting: Obstetrics & Gynecology

## 2022-03-05 ENCOUNTER — Ambulatory Visit (INDEPENDENT_AMBULATORY_CARE_PROVIDER_SITE_OTHER): Payer: 59 | Admitting: Obstetrics & Gynecology

## 2022-03-05 VITALS — BP 114/72 | HR 70 | Ht 66.0 in | Wt 211.4 lb

## 2022-03-05 DIAGNOSIS — N3281 Overactive bladder: Secondary | ICD-10-CM

## 2022-03-05 DIAGNOSIS — N8110 Cystocele, unspecified: Secondary | ICD-10-CM | POA: Diagnosis not present

## 2022-03-05 MED ORDER — MIRABEGRON ER 25 MG PO TB24: 25.0000 mg | ORAL_TABLET | Freq: Every day | ORAL | 11 refills | Status: DC

## 2022-03-05 NOTE — Progress Notes (Signed)
   GYN VISIT Patient name: Kendra Soto MRN [id number]  Date of birth: 06-13-1975 Chief Complaint:   Gynecologic Exam  History of Present Illness:   Kendra Soto is a 46 y.o. G32P3003 PH female being seen today for the following concern:  -Urinary concerns/OAB: Urinary frequency and urgency.  Denies urge incontinence, but feels like it's close.  Typically 1-2x per week drinking sweet tea or green tea on occasion.   Sometimes notes stress incontinence.  Up at least 1-3x per night.  She is taking oxybutynin and it does not seem to be helping.  - Vaginal discomfort: Notes feel raw all the time.  Notes burning sensation especially after intercourse.  Denies dyspareuenia, rather just feels raw- not all the time, but a lot.         Patient's last menstrual period was 01/22/2021.     03/05/2022    2:34 PM 05/08/2020    2:41 PM  Depression screen PHQ 2/9  Decreased Interest 2 1  Down, Depressed, Hopeless 1 0  PHQ - 2 Score 3 1  Altered sleeping 3 2  Tired, decreased energy 3 2  Change in appetite 1 0  Feeling bad or failure about yourself  1 0  Trouble concentrating 2 2  Moving slowly or fidgety/restless 1 0  Suicidal thoughts 0 0  PHQ-9 Score 14 7     Review of Systems:   Pertinent items are noted in HPI Denies fever/chills, dizziness, headaches, visual disturbances, fatigue, shortness of breath, chest pain, abdominal pain, vomiting. Pertinent History Reviewed:  Reviewed past medical,surgical, social, obstetrical and family history.  Reviewed problem list, medications and allergies. Physical Assessment:   Vitals:   03/05/22 1428  BP: 114/72  Pulse: 70  Weight: 211 lb 6.4 oz (95.9 kg)  Height: '5\' 6"'$  (1.676 m)  Body mass index is 34.12 kg/m.       Physical Examination:   General appearance: alert, well appearing, and in no distress  Psych: mood appropriate, normal affect  Skin: warm & dry   Cardiovascular: normal heart rate noted  Respiratory: normal respiratory effort, no  distress  Abdomen: soft, non-tender   Pelvic: VULVA: normal appearing vulva with no masses, tenderness or lesions, VAGINA: normal appearing vagina with normal color and discharge, no lesions.  Stage 1 cystocele noted.  Uterus and cervix surgically absent.  ADNEXA: normal adnexa in size, nontender and no masses  Extremities: no edema   Chaperone:  pt declined     Assessment & Plan:  1) OAB, Cystocele -oxybutynin is not working, tried sample of gemtesa from PCP and felt it worked better -given Development worker, international aid and Rx sent in -discussed pelvic floor therapy, pt previously did her own exercises and plans to return to these exercise  Pt had questions regarding weight loss and options- advised f/u with PCP  Meds ordered this encounter  Medications   mirabegron ER (MYRBETRIQ) 25 MG TB24 tablet    Sig: Take 1 tablet (25 mg total) by mouth daily.    Dispense:  30 tablet    Refill:  11     Return in about 3 months (around 06/04/2022) for Medication follow up.   Janyth Pupa, DO Attending [place], [facility] for [employer], [place]

## 2022-03-07 ENCOUNTER — Other Ambulatory Visit (HOSPITAL_COMMUNITY): Payer: Self-pay

## 2022-03-07 NOTE — Telephone Encounter (Signed)
PA has been submitted for [facility].  PA is pending determination through CMM.   Key: [place]

## 2022-03-11 ENCOUNTER — Other Ambulatory Visit: Payer: Self-pay | Admitting: Allergy & Immunology

## 2022-03-11 ENCOUNTER — Telehealth: Payer: Self-pay

## 2022-03-11 NOTE — Telephone Encounter (Signed)
PA submitted for Breztri Aerosphere 160-9-4.8MCG/ACT aerosol to [employer].  PA has been DENIED due to:   Coverage for this medication is denied for the following reason(s). We reviewed the information we received about your condition and circumstances. We used the plan approved policy when making this decision. The policy states that this medication may be approved when: -The member has a clinical condition or needs a specific dosage form for which there is no alternative on the formulary OR -The listed formulary alternatives are not recommended based on published guidelines or clinical literature OR -The formulary alternatives will likely be ineffective or less effective for the member OR -The formulary alternatives will likely cause an adverse effect OR -The member is unable to take the required number of formulary alternatives for the given diagnosis due to a trial and inadequate treatment response or contraindication OR -The member has tried and failed the required number of formulary alternatives. Based on the policy and the information we have, your request is denied. We did not receive any documentation that you meet any of the criteria outlined above. Formulary alternative(s) are Anoro Ellipta, tiotropium bromide/Respimat, Bevespi. Requirement: 3 in a class with 3 or more alternatives, 2 in a class with 2 alternatives, or 1 in a class with only 1 alternative. Please refer to your plan documents for a complete list of alternatives.  Key: [place]

## 2022-03-11 NOTE — Telephone Encounter (Signed)
Denial letter attached in patient documents.

## 2022-03-12 ENCOUNTER — Other Ambulatory Visit (HOSPITAL_COMMUNITY): Payer: Self-pay

## 2022-03-12 NOTE — Telephone Encounter (Signed)
[employer], [place] [address], [place] [address] Phone: [phone]  Fax: [phone]

## 2022-03-12 NOTE — Telephone Encounter (Signed)
What pharmacy is this? I am going to ask our Rep to contact the pharmacy.   Salvatore Marvel, MD Allergy and [place] [facility] [place]

## 2022-03-12 NOTE — Telephone Encounter (Signed)
Pa for breztri denied insurance prefers [employer], tiotropium bromide/Respimat, Bevespi.

## 2022-03-13 ENCOUNTER — Ambulatory Visit
Admission: RE | Admit: 2022-03-13 | Discharge: 2022-03-13 | Disposition: A | Discharge status: Home / Self Care | Payer: 59 | Source: Ambulatory Visit | Attending: Psychiatry | Admitting: Psychiatry

## 2022-03-13 DIAGNOSIS — R200 Anesthesia of skin: Secondary | ICD-10-CM

## 2022-03-13 DIAGNOSIS — R519 Headache, unspecified: Secondary | ICD-10-CM

## 2022-03-13 DIAGNOSIS — M5412 Radiculopathy, cervical region: Secondary | ICD-10-CM | POA: Diagnosis not present

## 2022-03-13 MED ORDER — GADOPICLENOL 0.5 MMOL/ML IV SOLN
10.0000 mL | Freq: Once | INTRAVENOUS | Status: AC | PRN
  Administered 2022-03-13: 10 mL via INTRAVENOUS

## 2022-03-13 NOTE — Telephone Encounter (Signed)
MyChart message sent to patient so that she is aware of where we are at the moment with approval. Asked patient if she needs more samples of Breztri until then.

## 2022-03-13 NOTE — Telephone Encounter (Signed)
Ok I reached out to [facility] to see if they could help.  Salvatore Marvel, MD Allergy and [place] [facility] [place]

## 2022-03-14 NOTE — Telephone Encounter (Signed)
I heard from [facility], the [place] Rep today:   Spent some time talking with the nice folks at [facility]. I told them Kendra Soto is 90% preferred on [employer] and most not covered are only covering dual therapy, so triple therapy of any kind would require a PA. She ran it again and it comes up as not on formulary, but it doesn't specify which of the many plans the patient has and offers no covered substitutes. Very odd!! They said and explanation for the denial will go to the patient/provider. Without knowing why they denied it beyond formulary and more importantly, what they want the patient to try, it's hard to know what to advise. The first step will be to check the explanation for denial. Sorry I couldn't just fix it over the phone.   It should be noted that she has been on an failed Trelegy, if that is a step edit needed.   Salvatore Marvel, MD Allergy and [place] [facility] [place]

## 2022-03-14 NOTE — Telephone Encounter (Signed)
Sample was given to patients daughter today as she was in office today.

## 2022-03-17 ENCOUNTER — Encounter: Payer: Self-pay | Admitting: Obstetrics & Gynecology

## 2022-03-19 NOTE — Telephone Encounter (Signed)
Can we get a sample or two of Breztri at the front for her?  Salvatore Marvel, MD Allergy and [place] [facility] [place]

## 2022-03-19 NOTE — Telephone Encounter (Signed)
Rushie's daughter picked up samples of Breztri last week.

## 2022-03-25 ENCOUNTER — Other Ambulatory Visit: Payer: Self-pay | Admitting: Obstetrics & Gynecology

## 2022-03-25 DIAGNOSIS — N3281 Overactive bladder: Secondary | ICD-10-CM

## 2022-03-25 MED ORDER — OXYBUTYNIN CHLORIDE ER 15 MG PO TB24: 15.0000 mg | ORAL_TABLET | Freq: Every day | ORAL | 4 refills | Status: DC

## 2022-03-25 NOTE — Progress Notes (Signed)
Myrbetriq not covered, currently on oxy ER '10mg'$  daily and helping but not as well as she would like.  Increased to '15mg'$  ER daily  Janyth Pupa, DO Attending [place], [facility] for [employer], [place]

## 2022-03-28 MED ORDER — SPIRIVA RESPIMAT 1.25 MCG/ACT IN AERS: 2.0000 | INHALATION_SPRAY | Freq: Every day | RESPIRATORY_TRACT | 5 refills | Status: DC

## 2022-03-28 NOTE — Addendum Note (Signed)
Addended by: Valentina Shaggy on: 03/28/2022 03:08 PM   Modules accepted: Orders

## 2022-04-11 ENCOUNTER — Other Ambulatory Visit: Payer: Self-pay | Admitting: Allergy & Immunology

## 2022-04-11 NOTE — Telephone Encounter (Signed)
PATIENT IS ON BREZTRI PER DR. GALLAGHER

## 2022-04-18 ENCOUNTER — Other Ambulatory Visit: Payer: Self-pay | Admitting: *Deleted

## 2022-04-18 MED ORDER — FLUTICASONE-SALMETEROL 500-50 MCG/ACT IN AEPB: INHALATION_SPRAY | RESPIRATORY_TRACT | 5 refills | Status: DC

## 2022-04-18 MED ORDER — SPIRIVA RESPIMAT 1.25 MCG/ACT IN AERS: 2.0000 | INHALATION_SPRAY | Freq: Every day | RESPIRATORY_TRACT | 5 refills | Status: DC

## 2022-04-25 ENCOUNTER — Ambulatory Visit: Payer: 59 | Admitting: Allergy & Immunology

## 2022-04-25 ENCOUNTER — Other Ambulatory Visit: Payer: Self-pay

## 2022-04-25 ENCOUNTER — Encounter: Payer: Self-pay | Admitting: Allergy & Immunology

## 2022-04-25 VITALS — BP 118/72 | HR 95 | Temp 97.8°F | Resp 20 | Ht 65.0 in | Wt 213.0 lb

## 2022-04-25 DIAGNOSIS — J4540 Moderate persistent asthma, uncomplicated: Secondary | ICD-10-CM | POA: Diagnosis not present

## 2022-04-25 DIAGNOSIS — J302 Other seasonal allergic rhinitis: Secondary | ICD-10-CM

## 2022-04-25 DIAGNOSIS — K219 Gastro-esophageal reflux disease without esophagitis: Secondary | ICD-10-CM

## 2022-04-25 DIAGNOSIS — J3089 Other allergic rhinitis: Secondary | ICD-10-CM

## 2022-04-25 NOTE — Progress Notes (Signed)
FOLLOW UP  Date of Service/Encounter:  04/25/22   Assessment:   Moderate persistent asthma, uncomplicated   Perennial and seasonal allergic rhinitis (grasses, trees, indoor molds, outdoor molds, dust mites, and cat)   History of rheumatoid arthiritis - was on MTX and prednisone without relief (discontinued January/February 2022) most recently followed by Dr. Benjamine Mola   Hypothyroidism   GERD - on omeprazole   Overall, Mannie is doing very well from a breathing perspective.  We have finally found a combination of medicines that is both affordable and efficacious for her.  She does not feel like she needs to advance to a biologic at this point in time.  Her allergies are still not under great control, although obviously the medications make things better.  She has a high deductible plan and is worried about her cost associated with the allergy shots.  She is can to see if she gets closer to her deductible over the course of the year and make a decision about starting them by the end of 2024.  I think they would be very beneficial to her.  If she does meet her deductible, she would benefit from rush immunotherapy so that she could save some trips to the office during her buildup phase.  In addition, her husband is going to be starting a new job which might have better healthcare coverage.  That could change the equation as well.  Plan/Recommendations:   1. Moderate persistent asthma, uncomplicated - Lung testing looked good today. - We are not going to make any changes today. - Daily controller medication(s): Advair 500/36mg one puff twice daily and Spiriva 1.243m two puffs once daily - Prior to physical activity: albuterol 2 puffs 10-15 minutes before physical activity. - Rescue medications: albuterol 4 puffs every 4-6 hours as needed - Asthma control goals:  * Full participation in all desired activities (may need albuterol before activity) * Albuterol use two time or less a week on  average (not counting use with activity) * Cough interfering with sleep two time or less a month * Oral steroids no more than once a year * No hospitalizations  2. Chronic rhinitis (grasses, trees, indoor molds, outdoor molds, dust mites, and cat) - Continue with: Xyzal (levocetirizine) 34m97mablet once daily, Singulair (montelukast) 28m37mily, and Atrovent (ipratropium) 0.03% one spray per nostril 2-3 times daily AS NEEDED (CAN BE OVER DRYING) - Continue taking: Dymista two sprays per nostril nightly (splitting it into two separate sprays is fine) - You can use an extra dose of the antihistamine, if needed, for breakthrough symptoms.  - Consider nasal saline rinses 1-2 times daily to remove allergens from the nasal cavities as well as help with mucous clearance (this is especially helpful to do before the nasal sprays are given)  3. GERD - Stay on the omeprazole 40ng in the morning. - Stop the famotidine and see if this helps over the next week.  - Try Mucinex to see if this can help, too.   4. Return in about 6 months (around 10/24/2022).    Subjective:   RuthMeliah Droleta 46 y66. female presenting today for follow up of  Chief Complaint  Patient presents with   Follow-up    Pt states she has been doing pretty good and noticed a difference with the inhaler spriva.    RuthAmonee Dorwart a history of the following: Patient Active Problem List   Diagnosis Date Noted   Seasonal and perennial allergic rhinitis 02/14/2022  Not well controlled moderate persistent asthma 02/14/2022   Rheumatoid arthritis ([place]) 04/12/2020   High risk medication use 04/12/2020   Chronic RUQ pain 04/16/2018   Abdominal pain, chronic, epigastric 04/16/2018   GERD (gastroesophageal reflux disease) 04/16/2018   Esophageal dysphagia 04/16/2018    History obtained from: chart review and patient.  Tameeka is a 47 y.o. female presenting for a follow up visit.  I last saw her in May 2023.  At that time, symptoms  were under better control, but she was having a lot of nighttime symptoms.  She already was on maximized reflux medications.  We ended up adding Arnuity 1 puff once daily to her Advair 500/50 mcg 1 puff twice daily.  For her allergic rhinitis, we continue with Xyzal as well as Singulair, Atrovent, and Dymista.  She saw Webb Silversmith one of our nurse practitioners in December 2023.  At that time, she was started on Breztri 2 puffs twice daily.  She had a difficult time getting any of her inhalers approved for an affordable price.  She did talk about Biologics to help with her asthma control.  For her allergic rhinitis, she was continued on Xyzal as well as Astelin and Atrovent.  She was also continued on omeprazole.  She was started on famotidine to help supplement the omeprazole's activity.  Since the last visit, she has done well. She is getting over a sinus infection two weeks ago. She has been treating with OTC medications. It was clearing on its own, so she did not go to the doctor office.   Asthma/Respiratory Symptom History: She is still on the Advair 500/49mg one puff twice daily and the Spiriva two puffs once daily.  She has Advair for $25 a month and then the Spiriva is either $5 or $0. She does feel that the current regimen has been helping her to keep healthy. She is feeling happy with how things are going.  She has not been on prednisone.  She has not been to the emergency room.  Allergic Rhinitis Symptom History: Allergic rhinitis symptoms have been fairly severe. Her current regimen includes montelukast and the levocetirizine. Insurance covers some levocetirizine; she is not sure how much the remainder is. Insurance does not want to cover generic Dymista; they are switching it into the two nasal spray. She has the Atrovent as well. She is now taking three nasal sprays.  She is confused by the billing. Deductible is around $5,000.  They get their insurance through the [employer]  GERD Symptom  History: She remains on the omeprazole 40 mg in the morning. She also has famotidine that she takes at night. She reports that there is a lot of congestion and mucous in her throat. She feels that she is making her throat raw and nothing was coming up. She has been taking earlier in the day - around 5:30 or so. This has helped a lot.  She does drink plenty of fluids. She has not tried Mucinex. She is having a lot of dry throat.   She remains on the Amiovig and the Neutrec are expensive. This is helping with her deductible.   Otherwise, there have been no changes to her past medical history, surgical history, family history, or social history.    Review of Systems  Constitutional: Negative.  Negative for chills, fever, malaise/fatigue and weight loss.  HENT:  Positive for congestion. Negative for ear discharge and ear pain.        Positive for postnasal drip.  Positive for mucous plugs in her throat  Eyes:  Negative for pain, discharge and redness.  Respiratory:  Negative for cough, sputum production, shortness of breath and wheezing.   Cardiovascular: Negative.  Negative for chest pain and palpitations.  Gastrointestinal:  Negative for abdominal pain, constipation, diarrhea, heartburn, nausea and vomiting.  Skin: Negative.  Negative for itching and rash.  Neurological:  Negative for dizziness and headaches.  Endo/Heme/Allergies:  Positive for environmental allergies. Does not bruise/bleed easily.       Objective:   Blood pressure 118/72, pulse 95, temperature 97.8 F (36.6 C), resp. rate 20, height 5' 5"$  (1.651 m), weight 213 lb (96.6 kg), last menstrual period 01/22/2021, SpO2 96 %. Body mass index is 35.45 kg/m.    Physical Exam Vitals reviewed.  Constitutional:      Appearance: She is well-developed.  HENT:     Head: Normocephalic and atraumatic.     Right Ear: Tympanic membrane, ear canal and external ear normal. No drainage, swelling or tenderness. Tympanic membrane is not  injected, scarred, erythematous, retracted or bulging.     Left Ear: Tympanic membrane, ear canal and external ear normal. No drainage, swelling or tenderness. Tympanic membrane is not injected, scarred, erythematous, retracted or bulging.     Nose: Mucosal edema and rhinorrhea present. No nasal deformity or septal deviation.     Right Turbinates: Enlarged, swollen and pale.     Left Turbinates: Enlarged, swollen and pale.     Right Sinus: No maxillary sinus tenderness or frontal sinus tenderness.     Left Sinus: No maxillary sinus tenderness or frontal sinus tenderness.     Comments: No nasal polyps.    Mouth/Throat:     Lips: Pink.     Mouth: Mucous membranes are moist. Mucous membranes are not pale and not dry.     Pharynx: Uvula midline.     Comments: Moderate cobblestoning present in the posterior oropharynx. Eyes:     General: Lids are normal. Allergic shiner present.        Right eye: No discharge.        Left eye: No discharge.     Conjunctiva/sclera: Conjunctivae normal.     Right eye: Right conjunctiva is not injected. No chemosis.    Left eye: Left conjunctiva is not injected. No chemosis.    Pupils: Pupils are equal, round, and reactive to light.  Cardiovascular:     Rate and Rhythm: Normal rate and regular rhythm.     Heart sounds: Normal heart sounds.  Pulmonary:     Effort: Pulmonary effort is normal. No tachypnea, accessory muscle usage or respiratory distress.     Breath sounds: Normal breath sounds. No wheezing, rhonchi or rales.     Comments: Moving air well in all lung fields. Chest:     Chest wall: No tenderness.  Abdominal:     Tenderness: There is no abdominal tenderness. There is no guarding or rebound.  Lymphadenopathy:     Head:     Right side of head: No submandibular, tonsillar or occipital adenopathy.     Left side of head: No submandibular, tonsillar or occipital adenopathy.     Cervical: No cervical adenopathy.  Skin:    General: Skin is warm.      Capillary Refill: Capillary refill takes less than 2 seconds.     Coloration: Skin is not pale.     Findings: No abrasion, erythema, petechiae or rash. Rash is not papular, urticarial or vesicular.  Neurological:  Mental Status: She is alert.  Psychiatric:        Behavior: Behavior is cooperative.      Diagnostic studies:    Spirometry: results normal (FEV1: 2.78/95%, FVC: 3.39/93%, FEV1/FVC: 82%).    Spirometry consistent with normal pattern.   Allergy Studies: none        Salvatore Marvel, MD  [facility] [place]

## 2022-04-25 NOTE — Patient Instructions (Addendum)
1. Moderate persistent asthma, uncomplicated - Lung testing looked good today. - We are not going to make any changes today. - Daily controller medication(s): Advair 500/73mg one puff twice daily and Spiriva 1.257m two puffs once daily - Prior to physical activity: albuterol 2 puffs 10-15 minutes before physical activity. - Rescue medications: albuterol 4 puffs every 4-6 hours as needed - Asthma control goals:  * Full participation in all desired activities (may need albuterol before activity) * Albuterol use two time or less a week on average (not counting use with activity) * Cough interfering with sleep two time or less a month * Oral steroids no more than once a year * No hospitalizations  2. Chronic rhinitis (grasses, trees, indoor molds, outdoor molds, dust mites, and cat) - Continue with: Xyzal (levocetirizine) 50m80mablet once daily, Singulair (montelukast) 69m72mily, and Atrovent (ipratropium) 0.03% one spray per nostril 2-3 times daily AS NEEDED (CAN BE OVER DRYING) - Continue taking: Dymista two sprays per nostril nightly (splitting it into two separate sprays is fine) - You can use an extra dose of the antihistamine, if needed, for breakthrough symptoms.  - Consider nasal saline rinses 1-2 times daily to remove allergens from the nasal cavities as well as help with mucous clearance (this is especially helpful to do before the nasal sprays are given)  3. GERD - Stay on the omeprazole 40ng in the morning. - Stop the famotidine and see if this helps over the next week.  - Try Mucinex to see if this can help, too.   4. Return in about 6 months (around 10/24/2022).    Please inform us o[place]oreaany Emergency Department visits, hospitalizations, or changes in symptoms. Call us bKoreaore going to the ED for breathing or allergy symptoms since we might be able to fit you in for a sick visit. Feel free to contact us aKoreatime with any questions, problems, or concerns.  It was a pleasure to see  you again today!  Websites that have reliable patient information: 1. American Academy of Asthma, Allergy, and Immunology: www.aaaai.org 2. Food Allergy Research and Education (FARE): foodallergy.org 3. Mothers of Asthmatics: http://www.asthmacommunitynetwork.org 4. American College of Allergy, Asthma, and Immunology: www.acaai.org   COVID-19 Vaccine Information can be found at: [email or web address] questions related to vaccine distribution or appointments, please email [email or web address] or call [phone] realize that you might be concerned about having an allergic reaction to the COVID19 vaccines. To help with that concern, WE ARE OFFERING THE COVID19 VACCINES IN OUR OFFICE! Ask the front desk for dates!     "Like" us oKoreaFacebook and Instagram for our latest updates!      A healthy democracy works best when ALL [employer]! Make sure you are registered to vote! If you have moved or changed any of your contact information, you will need to get this updated before voting!  In some cases, you MAY be able to register to vote online: [email or web address]

## 2022-05-05 ENCOUNTER — Encounter (HOSPITAL_COMMUNITY): Payer: Self-pay | Admitting: Internal Medicine

## 2022-05-07 ENCOUNTER — Other Ambulatory Visit (HOSPITAL_COMMUNITY): Payer: Self-pay | Admitting: Internal Medicine

## 2022-05-07 DIAGNOSIS — R921 Mammographic calcification found on diagnostic imaging of breast: Secondary | ICD-10-CM

## 2022-05-30 ENCOUNTER — Ambulatory Visit: Payer: 59 | Admitting: Cardiology

## 2022-06-09 ENCOUNTER — Encounter: Payer: Self-pay | Admitting: Obstetrics & Gynecology

## 2022-06-09 ENCOUNTER — Ambulatory Visit: Payer: 59 | Admitting: Obstetrics & Gynecology

## 2022-06-09 VITALS — BP 104/63 | HR 70 | Ht 66.0 in | Wt 211.0 lb

## 2022-06-09 DIAGNOSIS — N3281 Overactive bladder: Secondary | ICD-10-CM

## 2022-06-09 DIAGNOSIS — N8110 Cystocele, unspecified: Secondary | ICD-10-CM

## 2022-06-09 MED ORDER — OXYBUTYNIN CHLORIDE ER 15 MG PO TB24: 15.0000 mg | ORAL_TABLET | Freq: Every day | ORAL | 4 refills | Status: AC

## 2022-06-09 NOTE — Progress Notes (Signed)
   GYN VISIT Patient name: Kendra Soto MRN [id number]  Date of birth: 09-03-1975 Chief Complaint:   Follow-up (Bladder control seems better than it was)  History of Present Illness:   Kendra Soto is a 47 y.o. [id number] PH female being seen today for OAB  Last visit- Oxybutynin increased from 10 to 15 ER daily.  Myrbetriq was not covered.  Today she notes that she can definitely notice a difference.  It's not perfect, but better.  She occasional will note unsteady stream, will "push" and then have more urine come out.    Denies feeling of incomplete void.  Denies incontinence.      Patient's last menstrual period was 01/22/2021.     03/05/2022    2:34 PM 05/08/2020    2:41 PM  Depression screen PHQ 2/9  Decreased Interest 2 1  Down, Depressed, Hopeless 1 0  PHQ - 2 Score 3 1  Altered sleeping 3 2  Tired, decreased energy 3 2  Change in appetite 1 0  Feeling bad or failure about yourself  1 0  Trouble concentrating 2 2  Moving slowly or fidgety/restless 1 0  Suicidal thoughts 0 0  PHQ-9 Score 14 7     Review of Systems:   Pertinent items are noted in HPI Denies fever/chills, dizziness, headaches, visual disturbances, fatigue, shortness of breath, chest pain, abdominal pain, vomiting, Pertinent History Reviewed:  Reviewed past medical,surgical, social, obstetrical and family history.  Reviewed problem list, medications and allergies. Physical Assessment:   Vitals:   06/09/22 1459  BP: 104/63  Pulse: 70  Weight: 211 lb (95.7 kg)  Height: 5\' 6"  (1.676 m)  Body mass index is 34.06 kg/m.       Physical Examination:   General appearance: alert, well appearing, and in no distress  Psych: mood appropriate, normal affect  Skin: warm & dry   Cardiovascular: normal heart rate noted  Respiratory: normal respiratory effort, no distress  Pelvic: examination not indicated  Chaperone: N/A    Assessment & Plan:  1) OAB -doing well with current medication plan to  continue -discussed concerns for potential symptomatic cystocele as time progresses, currently not an issue -f/u yearly  Meds ordered this encounter  Medications   oxybutynin (DITROPAN XL) 15 MG 24 hr tablet    Sig: Take 1 tablet (15 mg total) by mouth at bedtime.    Dispense:  90 tablet    Refill:  4     Return in about 1 year (around 06/09/2023) for annual/medication follow up.   Janyth Pupa, DO Attending [place], [facility] for [employer], [place]

## 2022-06-13 ENCOUNTER — Other Ambulatory Visit: Payer: Self-pay | Admitting: Family Medicine

## 2022-06-24 ENCOUNTER — Ambulatory Visit (HOSPITAL_COMMUNITY)
Admission: RE | Admit: 2022-06-24 | Discharge: 2022-06-24 | Disposition: A | Discharge status: Home / Self Care | Payer: 59 | Source: Ambulatory Visit | Attending: Internal Medicine | Admitting: Internal Medicine

## 2022-06-24 ENCOUNTER — Encounter (HOSPITAL_COMMUNITY): Payer: Self-pay

## 2022-06-24 DIAGNOSIS — R921 Mammographic calcification found on diagnostic imaging of breast: Secondary | ICD-10-CM

## 2022-06-26 ENCOUNTER — Encounter: Payer: Self-pay | Admitting: Cardiology

## 2022-06-26 ENCOUNTER — Ambulatory Visit: Payer: 59 | Attending: Cardiology | Admitting: Cardiology

## 2022-06-26 VITALS — BP 110/60 | HR 84 | Ht 66.0 in | Wt 211.0 lb

## 2022-06-26 DIAGNOSIS — I493 Ventricular premature depolarization: Secondary | ICD-10-CM | POA: Diagnosis not present

## 2022-06-26 DIAGNOSIS — R002 Palpitations: Secondary | ICD-10-CM

## 2022-06-26 DIAGNOSIS — Z79899 Other long term (current) drug therapy: Secondary | ICD-10-CM | POA: Diagnosis not present

## 2022-06-26 NOTE — Patient Instructions (Signed)
Medication Instructions:  The current medical regimen is effective;  continue present plan and medications.  *If you need a refill on your cardiac medications before your next appointment, please call your pharmacy*   Follow-Up: At [facility], you and your health needs are our priority.  As part of our continuing mission to provide you with exceptional heart care, we have created designated Provider Care Teams.  These Care Teams include your primary Cardiologist (physician) and Advanced Practice Providers (APPs -  Physician Assistants and Nurse Practitioners) who all work together to provide you with the care you need, when you need it.  We recommend signing up for the patient portal called "MyChart".  Sign up information is provided on this After Visit Summary.  MyChart is used to connect with patients for Virtual Visits (Telemedicine).  Patients are able to view lab/test results, encounter notes, upcoming appointments, etc.  Non-urgent messages can be sent to your provider as well.   To learn more about what you can do with MyChart, go to [email or web address].    Your next appointment:   1 year(s)  Provider:   Dr Mark Skains      

## 2022-06-26 NOTE — Progress Notes (Signed)
Cardiology Office Note:    Date:  06/26/2022   ID:  Kendra Soto, DOB 1975/07/11, MRN [id number]  PCP:  Donetta Potts, MD   [facility] Providers Cardiologist:  None     Referring MD: Donetta Potts, MD    History of Present Illness:    Kendra Soto is a 47 y.o. female here for the follow-up of palpitations, 13% burden of PVCs on monitor.  Toprol 25 mg was tried first without much for response.  She was then started on flecainide 50 mg twice a day for suppression and this seemed to help out quite significantly.  From original consult visit: Saw cardiology about 2-3 years ago. Had thumping and a pause (describes PVCs). Did several tests including stress test as well as echocardiogram; 2021. Toprol did not seem to help. Feels at night. Left side.   She has hypothyroidism on 75 mcg of levothyroxine.  Has a history of migraines, asthma  EKG from October 03, 2021 showed heart rate of 70 with PVC no other significant abnormalities.  Doing well no fevers chills nausea vomiting syncope bleeding  Past Medical History:  Diagnosis Date   Allergies    Asthma    Dysrhythmia    GERD (gastroesophageal reflux disease)    History of kidney stones    Hx of migraine headaches    Hypothyroid    Overactive bladder    Palpitations     Past Surgical History:  Procedure Laterality Date   CHOLECYSTECTOMY  2016   COLONOSCOPY WITH PROPOFOL N/A 12/03/2021   Procedure: COLONOSCOPY WITH PROPOFOL;  Surgeon: Lanelle Bal, DO;  [facility]: [facility];  Service: Endoscopy;  Laterality: N/A;  10:30am, asa 2   ESOPHAGOGASTRODUODENOSCOPY N/A 04/30/2018   normal. Empiric dilatation   MALONEY DILATION N/A 04/30/2018   Procedure: Elease Hashimoto DILATION;  Surgeon: Corbin Ade, MD;  Location: [facility];  Service: Endoscopy;  Laterality: N/A;   POLYPECTOMY  12/03/2021   Procedure: POLYPECTOMY;  Surgeon: Lanelle Bal, DO;  Location: [facility];  Service: Endoscopy;;   TUBAL LIGATION      VAGINAL HYSTERECTOMY N/A 01/22/2021   Procedure: HYSTERECTOMY VAGINAL;  Surgeon: Myna Hidalgo, DO;  Location: [facility];  Service: Gynecology;  Laterality: N/A;   WISDOM TOOTH EXTRACTION      Current Medications: Current Meds  Medication Sig   albuterol (VENTOLIN HFA) 108 (90 Base) MCG/ACT inhaler Inhale 2 puffs into the lungs every 6 (six) hours as needed for wheezing or shortness of breath.   Azelastine-Fluticasone 137-50 MCG/ACT SUSP Place 1 spray into both nostrils 2 (two) times daily.   clobetasol ointment (TEMOVATE) 0.05 % Apply 1 Application topically 2 (two) times daily as needed (irritation).   Erenumab-aooe (AIMOVIG) 140 MG/ML SOAJ Inject 140 mg into the skin every 30 (thirty) days.   flecainide (TAMBOCOR) 50 MG tablet Take 1 tablet (50 mg total) by mouth 2 (two) times daily.   fluticasone (FLONASE) 50 MCG/ACT nasal spray Place into both nostrils.   fluticasone 0.05%-ketoconazole 2% 1:1 cream mixture Apply 1 application  topically daily as needed for irritation.   fluticasone-salmeterol (ADVAIR) 500-50 MCG/ACT AEPB INHALE 1 PUFF INTO THE LUNGS EVERY MORNING AND AT BEDTIME   ipratropium (ATROVENT) 0.03 % nasal spray 1 spray per nostril 2-3 times daily as needed.   levocetirizine (XYZAL) 5 MG tablet Take 1 tablet (5 mg total) by mouth daily as needed for allergies (Can take an extra dose during flare ups.).   levothyroxine (SYNTHROID) 75 MCG  tablet Take 75 mcg by mouth daily before breakfast.   montelukast (SINGULAIR) 10 MG tablet TAKE 1 TABLET BY MOUTH AT BEDTIME   omeprazole (PRILOSEC) 40 MG capsule Take 1 capsule (40 mg total) by mouth in the morning.   oxybutynin (DITROPAN XL) 15 MG 24 hr tablet Take 1 tablet (15 mg total) by mouth at bedtime.   Rimegepant Sulfate (NURTEC) 75 MG TBDP Take 1 tablet at the onset of migraine. 1 tablet in 24 hours.   Spacer/Aero-Holding Chambers DEVI 1 Device by Does not apply route as directed.   SPIRIVA RESPIMAT 1.25 MCG/ACT AERS Inhale 2 puffs  into the lungs daily.     Allergies:   Bee pollen and Short ragweed pollen ext   Social History   Socioeconomic History   Marital status: Married    Spouse name: Not on file   Number of children: 3   Years of education: Not on file   Highest education level: Not on file  Occupational History   Not on file  Tobacco Use   Smoking status: Former    Packs/day: 1.00    Years: 20.00    Additional pack years: 0.00    Total pack years: 20.00    Types: Cigarettes    Quit date: 2009    Years since quitting: 15.2   Smokeless tobacco: Never  Vaping Use   Vaping Use: Never used  Substance and Sexual Activity   Alcohol use: No   Drug use: No   Sexual activity: Yes    Birth control/protection: Surgical    Comment: hyst  Other Topics Concern   Not on file  Social History Narrative   Not on file   Social Determinants of Health   Financial Resource Strain: High Risk (03/05/2022)   Overall Financial Resource Strain (CARDIA)    Difficulty of Paying Living Expenses: Hard  Food Insecurity: Food Insecurity Present (03/05/2022)   Hunger Vital Sign    Worried About Running Out of Food in the Last Year: Sometimes true    Ran Out of Food in the Last Year: Never true  Transportation Needs: No Transportation Needs (03/05/2022)   PRAPARE - Administrator, Civil Service (Medical): No    Lack of Transportation (Non-Medical): No  Physical Activity: Inactive (03/05/2022)   Exercise Vital Sign    Days of Exercise per Week: 0 days    Minutes of Exercise per Session: 0 min  Stress: Stress Concern Present (03/05/2022)   [employer] of Occupational Health - Occupational Stress Questionnaire    Feeling of Stress : Rather much  Social Connections: Moderately Integrated (03/05/2022)   Social Connection and Isolation Panel [NHANES]    Frequency of Communication with Friends and Family: More than three times a week    Frequency of Social Gatherings with Friends and Family: Twice a  week    Attends Religious Services: More than 4 times per year    Active Member of [employer] or Organizations: No    Attends Engineer, structural: Never    Marital Status: Married     Family History: The patient's family history includes Allergic rhinitis in her brother and father; Breast cancer in her mother; Cancer in her father; Headache in her daughter and son; Hypertension in her father and mother; Migraines in her daughter, mother, sister, and sister; Rheum arthritis in her sister. There is no history of Colon cancer, Celiac disease, Crohn's disease, or Colon polyps.  ROS:   Please see the history of  present illness.      All other systems reviewed and are negative.  EKGs/Labs/Other Studies Reviewed:    The following studies were reviewed today:   Monitor 11/26/2021: - Sinus rhythm heart rate average 85 bpm. - Rare PACs - Frequent PVCs, 12.8%. - No atrial fibrillation present  Myocardial Perfusion Spect Multiple 10/28/2019: [facility]) Impressions:  1. No reversible ischemia or infarction.   2. Septal hypokinesis..   3. Left ventricular ejection fraction 45%   4. Non invasive risk stratification*: Intermediate     Stress test 10/2019: [facility])  Stress Findings  - The patient exercised for 5 minutes 13 seconds of a Bruce protocol reaching into stage 2 with a peak heart rate of 157 (90% of PMHR) achieving 7.0 METS .  - The patient reported dyspnea during the stress test.   - The stress was stopped due to target heart rate achieved and fatigue.  - Overall, the patient's exercise capacity was good. Blood pressure demonstrated a normal response to exercise. Heart rate demonstrated a normal response to exercise.   Baseline ECG  - Baseline ECG shows normal sinus rhythm and normal axis.   Stress ECG  - No significant ST segment changes or arrhythmias were noted during stress.  ECHO 10/04/19: [facility])  Summary    1. The left ventricle is normal  in size with normal wall thickness.    2. The left ventricular systolic function is overall normal, LVEF is  visually estimated at 50-55%.    3. The right ventricle is normal in size, with normal systolic function.     EKG:  EKG is personally reviewed.    12/20/2021: Sinus rhythm . Rate 83 bpm. LVH. QRS duration 72 ms 10/31/2021: Sinus rhythm 84, PVCs, nonspecific T wave flattening.  Recent Labs: No results found for requested labs within last 365 days.  Recent Lipid Panel No results found for: "CHOL", "TRIG", "HDL", "CHOLHDL", "VLDL", "LDLCALC", "LDLDIRECT"   Risk Assessment/Calculations:              Physical Exam:    VS:  BP 110/60 (BP Location: Left Arm, Patient Position: Sitting, Cuff Size: Normal)   Pulse 84   Ht 5\' 6"  (1.676 m)   Wt 211 lb (95.7 kg)   LMP 01/22/2021 Comment: Pregnancy test negative on 01/18/2021  BMI 34.06 kg/m     Wt Readings from Last 3 Encounters:  06/26/22 211 lb (95.7 kg)  06/09/22 211 lb (95.7 kg)  04/25/22 213 lb (96.6 kg)    GEN: Well nourished, well developed, in no acute distress HEENT: normal Neck: no JVD, carotid bruits, or masses Cardiac: RRR; no murmurs, rubs, or gallops, trace edema  Respiratory:  clear to auscultation bilaterally, normal work of breathing GI: soft, nontender, nondistended, + BS MS: no deformity or atrophy Skin: warm and dry, no rash Neuro:  Alert and Oriented x 3, Strength and sensation are intact Psych: euthymic mood, full affect   ASSESSMENT:    1. Palpitations   2. PVC's (premature ventricular contractions)   3. Medication management      PLAN:    In order of problems listed above:  Palpitations - Has had a prior history of tachycardia and has taken Toprol-XL 25 mg once a day but this did not have much of an effect.  After Zio patch monitor showed 12% PVCs, we started flecainide 50 mg twice a day.  She is feeling better on this medication.  Less palpitations.  Less frequency. -  ETT normal with  flecainide use.    Hypotension - Occasional hypotension is noted.  We have taken her off of her metoprolol. Blood pressure  better today.  Pump function is reassuring.  Follow up: 12 months  Medication Adjustments/Labs and Tests Ordered: Current medicines are reviewed at length with the patient today.  Concerns regarding medicines are outlined above.   No orders of the defined types were placed in this encounter.  No orders of the defined types were placed in this encounter.  Patient Instructions  Medication Instructions:  The current medical regimen is effective;  continue present plan and medications.  *If you need a refill on your cardiac medications before your next appointment, please call your pharmacy*   Follow-Up: At [facility], you and your health needs are our priority.  As part of our continuing mission to provide you with exceptional heart care, we have created designated Provider Care Teams.  These Care Teams include your primary Cardiologist (physician) and Advanced Practice Providers (APPs -  Physician Assistants and Nurse Practitioners) who all work together to provide you with the care you need, when you need it.  We recommend signing up for the patient portal called "MyChart".  Sign up information is provided on this After Visit Summary.  MyChart is used to connect with patients for Virtual Visits (Telemedicine).  Patients are able to view lab/test results, encounter notes, upcoming appointments, etc.  Non-urgent messages can be sent to your provider as well.   To learn more about what you can do with MyChart, go to [email or web address].    Your next appointment:   1 year(s)  Provider:   Dr Donato Schultz         Signed, Donato Schultz, MD  06/26/2022 4:19 PM    [facility]

## 2022-08-04 ENCOUNTER — Telehealth: Payer: Self-pay

## 2022-08-04 NOTE — Telephone Encounter (Signed)
PA request received via CMM for Omeprazole 40MG  dr capsules  PA submitted to [employer] via CMM and has been APPROVED from 08/04/2022-08/04/2023  Key: [id number]

## 2022-08-12 ENCOUNTER — Ambulatory Visit: Payer: 59 | Admitting: Adult Health

## 2022-08-12 ENCOUNTER — Encounter: Payer: Self-pay | Admitting: Adult Health

## 2022-08-12 VITALS — BP 104/63 | HR 77 | Ht 65.0 in | Wt 207.0 lb

## 2022-08-12 DIAGNOSIS — R200 Anesthesia of skin: Secondary | ICD-10-CM

## 2022-08-12 DIAGNOSIS — G43009 Migraine without aura, not intractable, without status migrainosus: Secondary | ICD-10-CM | POA: Diagnosis not present

## 2022-08-12 NOTE — Progress Notes (Signed)
PATIENT: Kendra Soto DOB: 1975-06-12  REASON FOR VISIT: follow up HISTORY FROM: patient PRIMARY NEUROLOGIST: Dr. Delena Bali  Chief Complaint  Patient presents with   Rm 4    Here alone for 6 month follow-up for migraine and arm numbness. Patient states her paresthesias are no better and she is now getting pain in her shoulders but the left is worse. She also states she notices some tingling in her feet when she first gets out of bed in the morning. Migraines are somewhat better. She is not getting them as frequently but Nurtec doesn't help consistently when she takes it. It may take upwards of 3 hours before it helps.      HISTORY OF PRESENT ILLNESS: Today 08/12/22:  Kendra Soto is a 47 y.o. female with a history of migraine headaches and paresthesias in the upper extremities.. Returns today for follow-up.  She reports that her headache frequency has improved.  She has approximately 1 headache weekly.  Her frequency of headaches increase the week before her next injection is due.  She remains on Aimovig 140 mg monthly.  She takes Nurtec for abortive therapy.  She reports that it does not work consistently.  However she does not want to retry a triptan due to potential side effects.  Continues to have Numbness in both arms- notices when they are bent or when she is driving. Feels weaker in the UEs. Arms get tired quickly.  She states when she was at her previous neurologist she had nerve conduction studies (this was several years ago).  Reports that she was told then that she may have the beginning of carpal tunnel syndrome.  She did have MRI of the cervical spine which was unremarkable.   02/03/22: Kendra Soto is a 47 y.o. female who has been followed in this office for migraine headaches.. Returns today for follow-up.  She was originally prescribed Bennie Pierini however her insurance would not cover this.  She is now on Aimovig 70 mg monthly injection.  Reports that it has helped. But the last 1.5  weeks has been getting a headache daily. Left side and starts at the base of the neck and moves to the front. Some nausea and vomiting with severe headaches. Photophobia and phonophobia. Reports sister has a history of chiari malformation.  Patient was sent to physical therapy for neck pain and paresthesias radiating down both arms.  She reports that it has persisted. Some improvement with ROM. Also notices a tremor in both arms with tedious activities such as eating, tying shoes. No family history of tremor.   Insurance only approved MRI of the brain did not approve cervical.  HISTORY Medical co-morbidities: asthma, hypothyroidism, prediabetes, PVCs, kidney stones   The patient presents for evaluation of headaches which began in childhood but have been worsening recently. She has had headaches daily for the past weeks. No clear onset for worsening of her headaches. Headaches are described as sharp or throbbing pain from her left eye radiating to her occiput. They are associated with photophobia, phonophobia, nausea, and vomiting. Sometimes gets paresthesias on the left side of her scalp as well.    She has noticed significant neck pain, and headaches can be triggered by turning her neck L>R. Pain will radiate from her neck down her arms on both sides. Arms will go numb when she is driving and has them resting on the steering wheel. Feels her grip strength has worsened bilaterally and has been dropping things more frequently.  Ruel Favors and Excedrin as needed. Ubrelvy 100 mg helps some of the time and she almost always has to take a second dose. It takes several hours to start working.   She is currently undergoing workup for arrhythmias. Is planned to wear a heart monitor for the next 2 weeks.   Headache History: Onset: childhood Triggers: turning her neck L>R, salty food Aura: sparkles (rare) Location: left side, occiput, retro-orbital Quality/Description: sharp, throbbing Associated  Symptoms:             Photophobia: yes             Phonophobia: yes             Nausea: yes Vomiting: yes Worse with activity?: yes Duration of headaches: up to 24 hours   Headache days per month: 21 Headache free days per month: 9   Current Treatment: Abortive Ubrelvy 100 mg PRN Excedrin   Preventative none   Prior Therapies                                 Metoprolol Topamax - contraindicated due to kidney stones Excedrin Imitrex 100 mg PRN - helped but caused neck and jaw pain Maxalt - lack of efficacy Ubrelvy 100 mg PRN    REVIEW OF SYSTEMS: Out of a complete 14 system review of symptoms, the patient complains only of the following symptoms, and all other reviewed systems are negative.  ALLERGIES: Allergies  Allergen Reactions   Bee Pollen Cough, Other (See Comments) and Shortness Of Breath   Short Ragweed Pollen Ext Cough, Other (See Comments) and Shortness Of Breath    HOME MEDICATIONS: Outpatient Medications Prior to Visit  Medication Sig Dispense Refill   albuterol (VENTOLIN HFA) 108 (90 Base) MCG/ACT inhaler Inhale 2 puffs into the lungs every 6 (six) hours as needed for wheezing or shortness of breath. 18 g 1   Azelastine-Fluticasone 137-50 MCG/ACT SUSP Place 1 spray into both nostrils 2 (two) times daily. 23 g 5   clobetasol ointment (TEMOVATE) 0.05 % Apply 1 Application topically 2 (two) times daily as needed (irritation).     Erenumab-aooe (AIMOVIG) 140 MG/ML SOAJ Inject 140 mg into the skin every 30 (thirty) days. 1.12 mL 11   flecainide (TAMBOCOR) 50 MG tablet Take 1 tablet (50 mg total) by mouth 2 (two) times daily. 180 tablet 3   fluticasone (FLONASE) 50 MCG/ACT nasal spray Place into both nostrils.     fluticasone 0.05%-ketoconazole 2% 1:1 cream mixture Apply 1 application  topically daily as needed for irritation.     fluticasone-salmeterol (ADVAIR) 500-50 MCG/ACT AEPB INHALE 1 PUFF INTO THE LUNGS EVERY MORNING AND AT BEDTIME 60 each 5   ipratropium  (ATROVENT) 0.03 % nasal spray 1 spray per nostril 2-3 times daily as needed. 30 mL 5   levocetirizine (XYZAL) 5 MG tablet Take 1 tablet (5 mg total) by mouth daily as needed for allergies (Can take an extra dose during flare ups.). 60 tablet 5   levothyroxine (SYNTHROID) 75 MCG tablet Take 75 mcg by mouth daily before breakfast.     montelukast (SINGULAIR) 10 MG tablet TAKE 1 TABLET BY MOUTH AT BEDTIME 30 tablet 4   omeprazole (PRILOSEC) 40 MG capsule Take 1 capsule (40 mg total) by mouth in the morning. 30 capsule 5   oxybutynin (DITROPAN XL) 15 MG 24 hr tablet Take 1 tablet (15 mg total) by mouth at bedtime. 90 tablet  4   Rimegepant Sulfate (NURTEC) 75 MG TBDP Take 1 tablet at the onset of migraine. 1 tablet in 24 hours. 16 tablet 11   Spacer/Aero-Holding Chambers DEVI 1 Device by Does not apply route as directed. 1 each 1   SPIRIVA RESPIMAT 1.25 MCG/ACT AERS Inhale 2 puffs into the lungs daily. 1 each 5   No facility-administered medications prior to visit.    PAST MEDICAL HISTORY: Past Medical History:  Diagnosis Date   Allergies    Asthma    Dysrhythmia    GERD (gastroesophageal reflux disease)    History of kidney stones    Hx of migraine headaches    Hypothyroid    Overactive bladder    Palpitations     PAST SURGICAL HISTORY: Past Surgical History:  Procedure Laterality Date   CHOLECYSTECTOMY  2016   COLONOSCOPY WITH PROPOFOL N/A 12/03/2021   Procedure: COLONOSCOPY WITH PROPOFOL;  Surgeon: Lanelle Bal, DO;  [facility]: [facility];  Service: Endoscopy;  Laterality: N/A;  10:30am, asa 2   ESOPHAGOGASTRODUODENOSCOPY N/A 04/30/2018   normal. Empiric dilatation   MALONEY DILATION N/A 04/30/2018   Procedure: Elease Hashimoto DILATION;  Surgeon: Corbin Ade, MD;  Location: [facility];  Service: Endoscopy;  Laterality: N/A;   POLYPECTOMY  12/03/2021   Procedure: POLYPECTOMY;  Surgeon: Lanelle Bal, DO;  Location: [facility];  Service: Endoscopy;;   TUBAL LIGATION      VAGINAL HYSTERECTOMY N/A 01/22/2021   Procedure: HYSTERECTOMY VAGINAL;  Surgeon: Myna Hidalgo, DO;  Location: [facility];  Service: Gynecology;  Laterality: N/A;   WISDOM TOOTH EXTRACTION      FAMILY HISTORY: Family History  Problem Relation Age of Onset   Breast cancer Mother    Hypertension Mother    Migraines Mother    Allergic rhinitis Father    Cancer Father        prostate   Hypertension Father    Migraines Sister    Migraines Sister    Rheum arthritis Sister    Migraines Daughter    Headache Daughter    Allergic rhinitis Brother    Headache Son    Colon cancer Neg Hx    Celiac disease Neg Hx    Crohn's disease Neg Hx    Colon polyps Neg Hx     SOCIAL HISTORY: Social History   Socioeconomic History   Marital status: Married    Spouse name: Not on file   Number of children: 3   Years of education: Not on file   Highest education level: Not on file  Occupational History   Not on file  Tobacco Use   Smoking status: Former    Packs/day: 1.00    Years: 20.00    Additional pack years: 0.00    Total pack years: 20.00    Types: Cigarettes    Quit date: 2009    Years since quitting: 15.4   Smokeless tobacco: Never  Vaping Use   Vaping Use: Never used  Substance and Sexual Activity   Alcohol use: No   Drug use: No   Sexual activity: Yes    Birth control/protection: Surgical    Comment: hyst  Other Topics Concern   Not on file  Social History Narrative   Not on file   Social Determinants of Health   Financial Resource Strain: High Risk (03/05/2022)   Overall Financial Resource Strain (CARDIA)    Difficulty of Paying Living Expenses: Hard  Food Insecurity: Food Insecurity Present (03/05/2022)  Hunger Vital Sign    Worried About Running Out of Food in the Last Year: Sometimes true    Ran Out of Food in the Last Year: Never true  Transportation Needs: No Transportation Needs (03/05/2022)   PRAPARE - Administrator, Civil Service (Medical):  No    Lack of Transportation (Non-Medical): No  Physical Activity: Inactive (03/05/2022)   Exercise Vital Sign    Days of Exercise per Week: 0 days    Minutes of Exercise per Session: 0 min  Stress: Stress Concern Present (03/05/2022)   [employer] of Occupational Health - Occupational Stress Questionnaire    Feeling of Stress : Rather much  Social Connections: Moderately Integrated (03/05/2022)   Social Connection and Isolation Panel [NHANES]    Frequency of Communication with Friends and Family: More than three times a week    Frequency of Social Gatherings with Friends and Family: Twice a week    Attends Religious Services: More than 4 times per year    Active Member of [employer] or Organizations: No    Attends Banker Meetings: Never    Marital Status: Married  Catering manager Violence: Not At Risk (03/05/2022)   Humiliation, Afraid, Rape, and Kick questionnaire    Fear of Current or Ex-Partner: No    Emotionally Abused: No    Physically Abused: No    Sexually Abused: No      PHYSICAL EXAM  Vitals:   08/12/22 1500  BP: 104/63  Pulse: 77  Weight: 207 lb (93.9 kg)  Height: 5\' 5"  (1.651 m)    Body mass index is 34.45 kg/m.  Generalized: Well developed, in no acute distress   Neurological examination  Mentation: Alert oriented to time, place, history taking. Follows all commands speech and language fluent Cranial nerve II-XII: Pupils were equal round reactive to light. Extraocular movements were full, visual field were full on confrontational test. Facial sensation and strength were normal.  Head turning and shoulder shrug  were normal and symmetric. Motor: The motor testing reveals 5 over 5 strength of all 4 extremities. Good symmetric motor tone is noted throughout.  Sensory: Sensory testing is intact to soft touch on all 4 extremities. No evidence of extinction is noted.  Coordination: Cerebellar testing reveals good finger-nose-finger and  heel-to-shin bilaterally.  Gait and station: Gait is normal.  Reflexes: Deep tendon reflexes are symmetric and normal bilaterally.   DIAGNOSTIC DATA (LABS, IMAGING, TESTING) - I reviewed patient records, labs, notes, testing and imaging myself where available.  Lab Results  Component Value Date   WBC 10.0 06/10/2021   HGB 12.7 06/10/2021   HCT 36.5 06/10/2021   MCV 92 06/10/2021   PLT 288 06/10/2021      Component Value Date/Time   NA 138 01/18/2021 1038   K 3.4 (L) 01/18/2021 1038   CL 107 01/18/2021 1038   CO2 23 01/18/2021 1038   GLUCOSE 83 01/18/2021 1038   BUN 17 01/18/2021 1038   CREATININE 0.68 01/18/2021 1038   CALCIUM 8.8 (L) 01/18/2021 1038   GFRNONAA >60 01/18/2021 1038     ASSESSMENT AND PLAN 47 y.o. year old female  has a past medical history of Allergies, Asthma, Dysrhythmia, GERD (gastroesophageal reflux disease), History of kidney stones, migraine headaches, Hypothyroid, Overactive bladder, and Palpitations. here with:  Migraine headaches Neck pain with paresthesias both arms  - Continue Aimovig 140 mg monthly injection  - continue Nurtec 75 mg daily. 1 tab in 24 hours. For abortive measures.  Ok to pair with Tylenol or Ibuprofen- just not meant to be taken daily. - NCV with EMG ordered for bilateral upper extremities - FU in 6 months.      Butch Penny, MSN, NP-C 08/12/2022, 2:51 PM [facility] [address] [place], [place] [address] [phone]

## 2022-08-12 NOTE — Patient Instructions (Addendum)
Your Plan:  Continue Aimovig  Continue Nurtec  Nerve conduction studies ordered    Thank you for coming to see [place] at [facility]. I hope we have been able to provide you high quality care today.  You may receive a patient satisfaction survey over the next few weeks. We would appreciate your feedback and comments so that we may continue to improve ourselves and the health of our patients. Continue

## 2022-08-18 ENCOUNTER — Ambulatory Visit (INDEPENDENT_AMBULATORY_CARE_PROVIDER_SITE_OTHER): Payer: 59 | Admitting: Neurology

## 2022-08-18 ENCOUNTER — Ambulatory Visit (INDEPENDENT_AMBULATORY_CARE_PROVIDER_SITE_OTHER): Payer: Self-pay | Admitting: Neurology

## 2022-08-18 DIAGNOSIS — G5603 Carpal tunnel syndrome, bilateral upper limbs: Secondary | ICD-10-CM | POA: Diagnosis not present

## 2022-08-18 DIAGNOSIS — Z0289 Encounter for other administrative examinations: Secondary | ICD-10-CM

## 2022-08-18 DIAGNOSIS — R200 Anesthesia of skin: Secondary | ICD-10-CM

## 2022-08-18 NOTE — Patient Instructions (Addendum)

## 2022-08-18 NOTE — Progress Notes (Unsigned)
      Full Name: Kendra Soto Gender: Female MRN #: [id number] Date of Birth: 05/01/1975    Visit Date: 08/18/2022 14:19 Age: 47 Years Examining Physician: Dr. Rhen Kawecki Referring Physician: Megan Millikan, NP Height: 5 feet 5 inch  History: Mild episodic positional numbness in both hands mostly digits 2-3. +Mcphalen's maneuver at the wrists.   Summary:   Nerve Conduction Studies were performed on the bilateral upper extremities.  The right median/ulnar (palm) comparison nerve showed prolonged distal peak latency (Median Palm, 2.3 ms, N<2.2) and abnormal peak latency difference (Median Palm-Ulnar Palm, 0.7 ms, N<0.4) with a relative median delay.    The left median/ulnar (palm) comparison nerve showed borderline distal peak latency (Median Palm, 2.2 ms, N<2.2) and abnormal peak latency difference (Median Palm-Ulnar Palm, 0.5 ms, N<0.4) with a relative median delay.    All remaining nerves (as indicated in the following tables) were within normal limits.  All muscles (as indicated in the following tables) were within normal limits.    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 Recommended conservative treatment for 6-8 weeks and to contact us if the symptoms do not improve.  ------------------------------- Kendra Bultman, MD  [facility] [address], [address] [place], [place] [address] Tel: [phone] Fax: [phone]  Verbal informed consent was obtained from the patient, patient was informed of potential risk of procedure, including bruising, bleeding, hematoma formation, infection, muscle weakness, muscle pain, numbness, among others.        MNC    Nerve / Sites Muscle Latency Ref. Amplitude Ref. Rel Amp Segments Distance Velocity  Ref. Area    ms ms mV mV %  cm m/s m/s mVms  R Median - APB     Wrist APB 3.4 ?4.4 7.7 ?4.0 100 Wrist - APB 7   26.2     Upper arm APB 8.0  7.3  94.6 Upper arm - Wrist 26 57 ?49 23.7  L Median - APB     Wrist APB 3.3 ?4.4 8.3 ?4.0 100 Wrist - APB 7   25.3     Upper arm APB 7.6  9.2  111 Upper arm - Wrist 26 60 ?49 28.8  R Ulnar - ADM     Wrist ADM 2.4 ?3.3 10.3 ?6.0 100 Wrist - ADM 7   34.5     B.Elbow ADM 4.4  9.2  89.3 B.Elbow - Wrist 12.6 62 ?49 31.8     A.Elbow ADM 7.1  9.3  101 A.Elbow - B.Elbow 15.6 58 ?49 32.4  L Ulnar - ADM     Wrist ADM 2.5 ?3.3 8.1 ?6.0 100 Wrist - ADM 7   27.1     B.Elbow ADM 4.0  8.0  99.5 B.Elbow - Wrist 10 65 ?49 26.5     A.Elbow ADM 6.7  7.9  98.2 A.Elbow - B.Elbow 17 64 ?49 26.3             SNC    Nerve / Sites Rec. Site Peak Lat Ref.  Amp Ref. Segments Distance Peak Diff Ref.    ms ms V V  cm ms ms  R Median, Ulnar - Transcarpal comparison     Median Palm Wrist 2.3 ?2.2 45 ?35 Median Palm - Wrist 8       Ulnar Palm Wrist 1.6 ?2.2 26 ?  12 Ulnar Palm - Wrist 8          Median Palm - Ulnar Palm  0.7 ?0.4  L Median, Ulnar - Transcarpal comparison     Median Palm Wrist 2.2 ?2.2 47 ?35 Median Palm - Wrist 8       Ulnar Palm Wrist 1.7 ?2.2 15 ?12 Ulnar Palm - Wrist 8          Median Palm - Ulnar Palm  0.5 ?0.4  R Median - Orthodromic (Dig II, Mid palm)     Dig II Wrist 3.2 ?3.4 11 ?10 Dig II - Wrist 13    L Median - Orthodromic (Dig II, Mid palm)     Dig II Wrist 3.4 ?3.4 15 ?10 Dig II - Wrist 13    R Ulnar - Orthodromic, (Dig V, Mid palm)     Dig V Wrist 2.5 ?3.1 16 ?5 Dig V - Wrist 11    L Ulnar - Orthodromic, (Dig V, Mid palm)     Dig V Wrist 2.6 ?3.1 14 ?5 Dig V - Wrist 11                   F  Wave    Nerve F Lat Ref.   ms ms  R Ulnar - ADM 26.3 ?32.0  L Ulnar - ADM 25.2 ?32.0         EMG Summary Table    Spontaneous MUAP Recruitment  Muscle IA Fib PSW Fasc Other Amp Dur. Poly Pattern  R. Cervical paraspinals (low) Normal None None None  _______ Normal Normal Normal Normal  L. Cervical paraspinals (low) Normal None None None _______ Normal Normal Normal Normal  R. Deltoid Normal None None None _______ Normal Normal Normal Normal  R. Triceps brachii Normal None None None _______ Normal Normal Normal Normal  R. Pronator teres Normal None None None _______ Normal Normal Normal Normal  R. Opponens pollicis Normal None None None _______ Normal Normal Normal Normal  R. First dorsal interosseous Normal None None None _______ Normal Normal Normal Normal     

## 2022-08-19 NOTE — Procedures (Unsigned)
Full Name: Robby Jiwani Gender: Female MRN #: [id number] Date of Birth: [birth year]    Visit Date: 08/18/2022 14:19 Age: 47 Years Examining Physician: Dr. Naomie Dean Referring Physician: Butch Penny, NP Height: 5 feet 5 inch  History: Mild episodic positional numbness in both hands mostly digits 2-3. +Mcphalen's maneuver at the wrists.   Summary:   Nerve Conduction Studies were performed on the bilateral upper extremities.  The right median/ulnar (palm) comparison nerve showed prolonged distal peak latency (Median Palm, 2.3 ms, N<2.2) and abnormal peak latency difference (Median Palm-Ulnar Palm, 0.7 ms, N<0.4) with a relative median delay.    The left median/ulnar (palm) comparison nerve showed borderline distal peak latency (Median Palm, 2.2 ms, N<2.2) and abnormal peak latency difference (Median Palm-Ulnar Palm, 0.5 ms, N<0.4) with a relative median delay.    All remaining nerves (as indicated in the following tables) were within normal limits.  All muscles (as indicated in the following tables) were within normal limits.    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 Recommended conservative treatment for 6-8 weeks and to contact us if the symptoms do not improve.  ------------------------------- Naomie Dean, MD  [facility] [address], [address] [place], [place] [address] Tel: [phone] Fax: [phone]  Verbal informed consent was obtained from the patient, patient was informed of potential risk of procedure, including bruising, bleeding, hematoma formation, infection, muscle weakness, muscle pain, numbness, among others.        MNC    Nerve / Sites Muscle Latency Ref. Amplitude Ref. Rel Amp Segments Distance Velocity  Ref. Area    ms ms mV mV %  cm m/s m/s mVms  R Median - APB     Wrist APB 3.4 ?4.4 7.7 ?4.0 100 Wrist - APB 7   26.2     Upper arm APB 8.0  7.3  94.6 Upper arm - Wrist 26 57 ?49 23.7  L Median - APB     Wrist APB 3.3 ?4.4 8.3 ?4.0 100 Wrist - APB 7   25.3     Upper arm APB 7.6  9.2  111 Upper arm - Wrist 26 60 ?49 28.8  R Ulnar - ADM     Wrist ADM 2.4 ?3.3 10.3 ?6.0 100 Wrist - ADM 7   34.5     B.Elbow ADM 4.4  9.2  89.3 B.Elbow - Wrist 12.6 62 ?49 31.8     A.Elbow ADM 7.1  9.3  101 A.Elbow - B.Elbow 15.6 58 ?49 32.4  L Ulnar - ADM     Wrist ADM 2.5 ?3.3 8.1 ?6.0 100 Wrist - ADM 7   27.1     B.Elbow ADM 4.0  8.0  99.5 B.Elbow - Wrist 10 65 ?49 26.5     A.Elbow ADM 6.7  7.9  98.2 A.Elbow - B.Elbow 17 64 ?49 26.3             SNC    Nerve / Sites Rec. Site Peak Lat Ref.  Amp Ref. Segments Distance Peak Diff Ref.    ms ms V V  cm ms ms  R Median, Ulnar - Transcarpal comparison     Median Palm Wrist 2.3 ?2.2 45 ?35 Median Palm - Wrist 8       Ulnar Palm Wrist 1.6 ?2.2 26 ?  12 Ulnar Palm - Wrist 8          Median Palm - Ulnar Palm  0.7 ?0.4  L Median, Ulnar - Transcarpal comparison     Median Palm Wrist 2.2 ?2.2 47 ?35 Median Palm - Wrist 8       Ulnar Palm Wrist 1.7 ?2.2 15 ?12 Ulnar Palm - Wrist 8          Median Palm - Ulnar Palm  0.5 ?0.4  R Median - Orthodromic (Dig II, Mid palm)     Dig II Wrist 3.2 ?3.4 11 ?10 Dig II - Wrist 13    L Median - Orthodromic (Dig II, Mid palm)     Dig II Wrist 3.4 ?3.4 15 ?10 Dig II - Wrist 13    R Ulnar - Orthodromic, (Dig V, Mid palm)     Dig V Wrist 2.5 ?3.1 16 ?5 Dig V - Wrist 11    L Ulnar - Orthodromic, (Dig V, Mid palm)     Dig V Wrist 2.6 ?3.1 14 ?5 Dig V - Wrist 28                   F  Wave    Nerve F Lat Ref.   ms ms  R Ulnar - ADM 26.3 ?32.0  L Ulnar - ADM 25.2 ?32.0         EMG Summary Table    Spontaneous MUAP Recruitment  Muscle IA Fib PSW Fasc Other Amp Dur. Poly Pattern  R. Cervical paraspinals (low) Normal None None None  _______ Normal Normal Normal Normal  L. Cervical paraspinals (low) Normal None None None _______ Normal Normal Normal Normal  R. Deltoid Normal None None None _______ Normal Normal Normal Normal  R. Triceps brachii Normal None None None _______ Normal Normal Normal Normal  R. Pronator teres Normal None None None _______ Normal Normal Normal Normal  R. Opponens pollicis Normal None None None _______ Normal Normal Normal Normal  R. First dorsal interosseous Normal None None None _______ Normal Normal Normal Normal

## 2022-09-06 ENCOUNTER — Other Ambulatory Visit: Payer: Self-pay | Admitting: Family Medicine

## 2022-09-12 ENCOUNTER — Ambulatory Visit: Payer: 59 | Admitting: Cardiology

## 2022-10-02 ENCOUNTER — Telehealth: Payer: Self-pay | Admitting: Psychiatry

## 2022-10-02 NOTE — Telephone Encounter (Signed)
LVM and sent mychart msg informing pt of need to reschedule 12/24/22 appt - MD leaving practice  If pt calls back to reschedule, ok to schedule with NP or can schedule with another provider who sees for symptoms pt was diagnosed with

## 2022-10-03 ENCOUNTER — Telehealth: Payer: Self-pay | Admitting: Adult Health

## 2022-10-03 DIAGNOSIS — M545 Low back pain, unspecified: Secondary | ICD-10-CM | POA: Diagnosis not present

## 2022-10-03 DIAGNOSIS — Z13220 Encounter for screening for lipoid disorders: Secondary | ICD-10-CM | POA: Diagnosis not present

## 2022-10-03 DIAGNOSIS — M542 Cervicalgia: Secondary | ICD-10-CM | POA: Diagnosis not present

## 2022-10-03 DIAGNOSIS — E039 Hypothyroidism, unspecified: Secondary | ICD-10-CM | POA: Diagnosis not present

## 2022-10-03 DIAGNOSIS — R5383 Other fatigue: Secondary | ICD-10-CM | POA: Diagnosis not present

## 2022-10-03 DIAGNOSIS — Z1321 Encounter for screening for nutritional disorder: Secondary | ICD-10-CM | POA: Diagnosis not present

## 2022-10-03 DIAGNOSIS — Z0000 Encounter for general adult medical examination without abnormal findings: Secondary | ICD-10-CM | POA: Diagnosis not present

## 2022-10-03 DIAGNOSIS — M1990 Unspecified osteoarthritis, unspecified site: Secondary | ICD-10-CM | POA: Diagnosis not present

## 2022-10-03 NOTE — Telephone Encounter (Signed)
FYI: Dr. Quentin Mulling patient has reassigned to Dr. Lucia Gaskins with migraines

## 2022-10-07 NOTE — Telephone Encounter (Signed)
Pattricia Boss, will you see if patient is ok seeing Butch Penny. She appears stable and Aundra Millet knows her. I can supervise. If not, please do not put her in a new patient slot for me thanks

## 2022-10-09 ENCOUNTER — Telehealth: Payer: Self-pay | Admitting: Allergy & Immunology

## 2022-10-09 DIAGNOSIS — J302 Other seasonal allergic rhinitis: Secondary | ICD-10-CM

## 2022-10-09 NOTE — Telephone Encounter (Signed)
Patient is going to come into the [place] office on Monday 10/13/2022 to sign consent and schedule for new start appointment.

## 2022-10-09 NOTE — Telephone Encounter (Signed)
Kendra Soto wants to start allergy injections. She has met her deductible with her current insurance and wants to get these ordered before September 1, after that she will change insurance and her deductible starts all over again. I don't see she has signed a consent form showing how many vials. Can this be done and sent to her by email, email on file is correct, or she can come by the [place] office on Monday and sign it then. Thank you.

## 2022-10-10 DIAGNOSIS — Z8679 Personal history of other diseases of the circulatory system: Secondary | ICD-10-CM | POA: Diagnosis not present

## 2022-10-10 DIAGNOSIS — E039 Hypothyroidism, unspecified: Secondary | ICD-10-CM | POA: Diagnosis not present

## 2022-10-10 DIAGNOSIS — M549 Dorsalgia, unspecified: Secondary | ICD-10-CM | POA: Diagnosis not present

## 2022-10-10 DIAGNOSIS — E871 Hypo-osmolality and hyponatremia: Secondary | ICD-10-CM | POA: Diagnosis not present

## 2022-10-10 DIAGNOSIS — R030 Elevated blood-pressure reading, without diagnosis of hypertension: Secondary | ICD-10-CM | POA: Diagnosis not present

## 2022-10-10 DIAGNOSIS — Z8669 Personal history of other diseases of the nervous system and sense organs: Secondary | ICD-10-CM | POA: Diagnosis not present

## 2022-10-10 DIAGNOSIS — Z0001 Encounter for general adult medical examination with abnormal findings: Secondary | ICD-10-CM | POA: Diagnosis not present

## 2022-10-10 DIAGNOSIS — Z23 Encounter for immunization: Secondary | ICD-10-CM | POA: Diagnosis not present

## 2022-10-10 DIAGNOSIS — M542 Cervicalgia: Secondary | ICD-10-CM | POA: Diagnosis not present

## 2022-10-10 DIAGNOSIS — Z6832 Body mass index (BMI) 32.0-32.9, adult: Secondary | ICD-10-CM | POA: Diagnosis not present

## 2022-10-10 NOTE — Telephone Encounter (Signed)
Forms are ready to be signed in the injection room.

## 2022-10-13 ENCOUNTER — Encounter (HOSPITAL_COMMUNITY): Payer: Self-pay | Admitting: Internal Medicine

## 2022-10-13 NOTE — Telephone Encounter (Signed)
Cree called and left the patient a voicemail to get scheduled  today.

## 2022-10-13 NOTE — Progress Notes (Signed)
Aeroallergen Immunotherapy  Ordering Provider: Dr. Malachi Bonds  Patient Details Name: Kendra Soto MRN: [id number] Date of Birth: Sep 15, 1975  Order 2 of 2  Vial Label: Molds  0.2 ml (Volume)  1:20 Concentration -- Alternaria alternata 0.2 ml (Volume)  1:20 Concentration -- Cladosporium herbarum 0.2 ml (Volume)  1:10 Concentration -- Aspergillus mix 0.2 ml (Volume)  1:10 Concentration -- Penicillium mix 0.2 ml (Volume)  1:20 Concentration -- Drechslera spicifera 0.2 ml (Volume)  1:10 Concentration -- Fusarium moniliforme 0.2 ml (Volume)  1:40 Concentration -- Aureobasidium pullulans 0.2 ml (Volume)  1:10 Concentration -- Rhizopus oryzae   1.6  ml Extract Subtotal 3.4  ml Diluent 5.0  ml Maintenance Total  Schedule:  B  Blue Vial (1:100,000): Schedule B (6 doses) Yellow Vial (1:10,000): Schedule B (6 doses) Green Vial (1:1,000): Schedule B (6 doses) Red Vial (1:100): Schedule A (14 doses)  Special Instructions: After completion of the first Red Vial, please space to every two weeks. After completion of the second Red Vial, please space to every 4 weeks. Ok to up dose new vials at 0.10mL --> 0.3 mL --> 0.5 mL. Ok to come twice weekly, if desired, as long as there is 48 hours between injections.

## 2022-10-13 NOTE — Progress Notes (Signed)
Aeroallergen Immunotherapy   Ordering Provider: Dr. Malachi Bonds   Patient Details  Name: Kendra Soto  MRN: [id number]  Date of Birth: Sep 16, 1975   Order 1 of 2   Vial Label: G/T/C/DM   0.4 ml (Volume)  BAU Concentration -- [place] 10,000  0.2 ml (Volume)  1:20 Concentration -- [place]  0.7 ml (Volume)  1:20 Concentration -- Eastern 10 Tree Mix (also Sweet Gum)  0.8 ml (Volume)  1:10 Concentration -- Cat Hair  0.8 ml (Volume)   AU Concentration -- Mite Mix (DF 5,000 & DP 5,000)    2.9  ml Extract Subtotal  2.1  ml Diluent  5.0  ml Maintenance Total   Schedule:  B   Blue Vial (1:100,000): Schedule B (6 doses)  Yellow Vial (1:10,000): Schedule B (6 doses)  Green Vial (1:1,000): Schedule B (6 doses)  Red Vial (1:100): Schedule A (14 doses)   Special Instructions: After completion of the first Red Vial, please space to every two weeks. After completion of the second Red Vial, please space to every 4 weeks. Ok to up dose new vials at 0.65mL --> 0.3 mL --> 0.5 mL. Ok to come twice weekly, if desired, as long as there is 48 hours between injections.

## 2022-10-13 NOTE — Progress Notes (Signed)
VIALS EXP 10-13-23

## 2022-10-13 NOTE — Telephone Encounter (Signed)
Wrote script

## 2022-10-14 DIAGNOSIS — J3089 Other allergic rhinitis: Secondary | ICD-10-CM

## 2022-10-15 DIAGNOSIS — J302 Other seasonal allergic rhinitis: Secondary | ICD-10-CM

## 2022-10-17 ENCOUNTER — Other Ambulatory Visit: Payer: Self-pay | Admitting: Allergy & Immunology

## 2022-10-22 ENCOUNTER — Ambulatory Visit: Payer: 59

## 2022-10-24 ENCOUNTER — Ambulatory Visit: Payer: 59 | Admitting: Internal Medicine

## 2022-10-24 ENCOUNTER — Other Ambulatory Visit: Payer: Self-pay

## 2022-10-24 ENCOUNTER — Ambulatory Visit: Payer: 59

## 2022-10-24 ENCOUNTER — Encounter: Payer: Self-pay | Admitting: Internal Medicine

## 2022-10-24 VITALS — BP 132/86 | HR 83 | Temp 97.9°F | Resp 18 | Ht 65.0 in | Wt 200.2 lb

## 2022-10-24 DIAGNOSIS — J309 Allergic rhinitis, unspecified: Secondary | ICD-10-CM

## 2022-10-24 DIAGNOSIS — K219 Gastro-esophageal reflux disease without esophagitis: Secondary | ICD-10-CM

## 2022-10-24 DIAGNOSIS — J302 Other seasonal allergic rhinitis: Secondary | ICD-10-CM | POA: Diagnosis not present

## 2022-10-24 DIAGNOSIS — J3089 Other allergic rhinitis: Secondary | ICD-10-CM

## 2022-10-24 DIAGNOSIS — J4540 Moderate persistent asthma, uncomplicated: Secondary | ICD-10-CM

## 2022-10-24 MED ORDER — SPIRIVA RESPIMAT 1.25 MCG/ACT IN AERS: 2.0000 | INHALATION_SPRAY | Freq: Every day | RESPIRATORY_TRACT | 5 refills | Status: DC

## 2022-10-24 MED ORDER — FLUTICASONE-SALMETEROL 500-50 MCG/ACT IN AEPB: 1.0000 | INHALATION_SPRAY | Freq: Two times a day (BID) | RESPIRATORY_TRACT | 5 refills | Status: DC

## 2022-10-24 MED ORDER — IPRATROPIUM BROMIDE 0.03 % NA SOLN: 1.0000 | Freq: Three times a day (TID) | NASAL | 5 refills | Status: DC | PRN

## 2022-10-24 MED ORDER — MONTELUKAST SODIUM 10 MG PO TABS: 10.0000 mg | ORAL_TABLET | Freq: Every day | ORAL | 5 refills | Status: DC

## 2022-10-24 MED ORDER — AZELASTINE HCL 0.1 % NA SOLN: 2.0000 | Freq: Two times a day (BID) | NASAL | 5 refills | Status: DC | PRN

## 2022-10-24 MED ORDER — LEVOCETIRIZINE DIHYDROCHLORIDE 5 MG PO TABS: 5.0000 mg | ORAL_TABLET | Freq: Every day | ORAL | 5 refills | Status: DC | PRN

## 2022-10-24 MED ORDER — EPINEPHRINE 0.3 MG/0.3ML IJ SOAJ: 0.3000 mg | INTRAMUSCULAR | 1 refills | Status: DC | PRN

## 2022-10-24 NOTE — Patient Instructions (Addendum)
1. Moderate persistent asthma, uncomplicated - Normal spirometry .  - Daily controller medication(s): Advair 500/63mcg 1 puff once daily and Spiriva 1.70mcg 2 puffs once daily. If symptoms worsen, increase Advair to 1 puff twice daily.  - Prior to physical activity: albuterol 2 puffs 10-15 minutes before physical activity. - Rescue medications: Albuterol 1-2 puffs every 4-6 hours as needed. - Asthma control goals:  * Full participation in all desired activities (may need albuterol before activity) * Albuterol use two time or less a week on average (not counting use with activity) * Cough interfering with sleep two time or less a month * Oral steroids no more than once a year * No hospitalizations  2. Chronic rhinitis (grasses, trees, indoor molds, outdoor molds, dust mites, and cat) - Avoidance measures discussed. - Use nasal saline rinses before nose sprays such as with Neilmed Sinus Rinse.  Use distilled water.  . - Use Azelastine 1-2 sprays each nostril twice daily as needed for runny nose, drainage, sneezing, congestion. Aim upward and outward. - Use Ipratroprium 1-2 sprays up to four times daily as needed for runny nose. Aim upward and outward. Be careful as this can be drying.  - Use Xyzal 5mg  daily as needed.  On shot days, take Xyzal 5mg  daily.   - Use Singulair 10mg  daily. Stop if there are any mood/behavioral changes. - For eyes, use Olopatadine or Ketotifen 1 eye drop daily as needed for itchy, watery eyes.  Available over the counter, if not covered by insurance.  - Continue allergy shots on schedule.  Keep Epipen.   3. GERD -Stay on the omeprazole 40mg  in the morning. -Avoid lying down for at least two hours after a meal or after drinking acidic beverages, like soda, or other caffeinated beverages. This can help to prevent stomach contents from flowing back into the esophagus. -Keep your head elevated while you sleep. Using an extra pillow or two can also help to prevent  reflux. -Eat smaller and more frequent meals each day instead of a few large meals. This promotes digestion and can aid in preventing heartburn. -Wear loose-fitting clothes to ease pressure on the stomach, which can worsen heartburn and reflux. -Reduce excess weight around the midsection. This can ease pressure on the stomach. Such pressure can force some stomach contents back up the esophagus.  - Please schedule follow up with GI. [facility] at [facility] [address] [place], [place] [address] [phone]   4. Return in about 3 months (around 01/24/2023).

## 2022-10-24 NOTE — Progress Notes (Signed)
Immunotherapy   Patient Details  Name: Kaoir Courchesne MRN: [id number] Date of Birth: 1975/05/18  10/24/2022  [name] started injections for  molds, grasses, trees, cats, and dust mites.  Following schedule: B  Frequency:2 times per week Epi-Pen:Epi-Pen Available  Consent signed previously and patient instructions given. Patient sat in room two for thirty minutes without an issue.    Ralene Muskrat 10/24/2022, 10:32 AM

## 2022-10-24 NOTE — Progress Notes (Signed)
FOLLOW UP Date of Service/Encounter:  10/24/22   Subjective:  Kendra Soto (DOB: [birth year]) is a 47 y.o. female who [facility] for follow up for moderate persistent asthma, allergic rhinoconjunctivitis and GERD.   History obtained from: chart review and patient. Last visit was with Dr. Dellis Anes on 04/25/2022: discussed starting AIT.  Controlled on Advair and Spiriva.  Also on Singulair, Atrovent, Xyzal, Dymista and Omeprazole.   Asthma: Reports doing well since last visit has not had much trouble with cough, wheeze, shortness of breath.  Taking Singulair daily, Advair 1 puff once a day and Spiriva 2 puffs once a day.  Has not needed her albuterol too frequent.  No ER visits or oral prednisone use since last visit.  Rhinoconjunctivitis: She is having trouble with dryness but at the same time also with throat clearing and mucous in her throat.  She has started allergy shots today.  Has also backed off her nose sprays and antihistamines due to trouble with the dryness.  Since stopping them she has not noticed much difference in her postnasal drip, congestion, drainage and therefore would like to remain on them as needed.  GERD:  Reports having heartburn, frequent throat clearing, trouble swallowing and feeling like food gets stuck in her esophagus sometimes.  Also noted to have throat burning.  Taking omeprazole daily and has tried Pepcid in the past without any improvement so she stopped that.  Previously saw GI and had an EGD in 2020 that was normal appearance but did require dilation.   Past Medical History: Past Medical History:  Diagnosis Date   Allergies    Asthma    Dysrhythmia    GERD (gastroesophageal reflux disease)    History of kidney stones    Hx of migraine headaches    Hypothyroid    Overactive bladder    Palpitations     Objective:  BP 132/86   Pulse 83   Temp 97.9 F (36.6 C)   Resp 18   Ht 5\' 5"  (1.651 m)   Wt 200 lb  3.2 oz (90.8 kg)   LMP 01/22/2021 Comment: Pregnancy test negative on 01/18/2021  SpO2 97%   BMI 33.32 kg/m  Body mass index is 33.32 kg/m. Physical Exam: GEN: alert, well developed HEENT: clear conjunctiva, TM grey and translucent, nose with mild inferior turbinate hypertrophy, pink nasal mucosa, no rhinorrhea, + cobblestoning HEART: regular rate and rhythm, no murmur LUNGS: clear to auscultation bilaterally, no coughing, unlabored respiration SKIN: no rashes or lesions  Spirometry:  Tracings reviewed. Her effort: Good reproducible efforts. FVC: 3.66L FEV1: 3.05L, 105% predicted FEV1/FVC ratio: 83% Interpretation: Spirometry consistent with normal pattern.  Please see scanned spirometry results for details.  Assessment:   1. Moderate persistent asthma, uncomplicated   2. Allergic rhinitis, unspecified seasonality, unspecified trigger   3. Seasonal and perennial allergic rhinitis   4. Gastroesophageal reflux disease, unspecified whether esophagitis present     Plan/Recommendations:   1. Moderate persistent asthma, uncomplicated - Well controlled with normal spirometry .  - Daily controller medication(s): Advair 500/48mcg 1 puff once daily and Spiriva 1.29mcg 2 puffs once daily. If symptoms worsen, increase Advair to 1 puff twice daily.  - Prior to physical activity: albuterol 2 puffs 10-15 minutes before physical activity. - Rescue medications: Albuterol 1-2 puffs every 4-6 hours as needed. - Asthma control goals:  * Full participation in all desired activities (may need albuterol before activity) * Albuterol use two time or  less a week on average (not counting use with activity) * Cough interfering with sleep two time or less a month * Oral steroids no more than once a year * No hospitalizations  2. Chronic rhinitis (grasses, trees, indoor molds, outdoor molds, dust mites, and cat) - Discussed throat clearing and mucous can be due to uncontrolled rhinitis.  Unfortunately  also having trouble with dryness which is a side effect of anti histamine use.  Has started AIT for long term control.  - Avoidance measures discussed. - Use nasal saline rinses before nose sprays such as with Neilmed Sinus Rinse.  Use distilled water.  . - Use Azelastine 1-2 sprays each nostril twice daily as needed for runny nose, drainage, sneezing, congestion. Aim upward and outward. - Use Ipratroprium 1-2 sprays up to four times daily as needed for runny nose. Aim upward and outward. Be careful as this can be drying.  - Use Xyzal 5mg  daily as needed.  On shot days, take Xyzal 5mg  daily.   - Use Singulair 10mg  daily. Stop if there are any mood/behavioral changes. - For eyes, use Olopatadine or Ketotifen 1 eye drop daily as needed for itchy, watery eyes.  Available over the counter, if not covered by insurance.  - Continue allergy shots on schedule.  Keep Epipen.   3. GERD - Uncontrolled Plan to see GI due to throat irritation, reflux, heartburn and food getting stuck despite PPI use. Tried Pepcid also. -Stay on the omeprazole 40mg  in the morning. -Avoid lying down for at least two hours after a meal or after drinking acidic beverages, like soda, or other caffeinated beverages. This can help to prevent stomach contents from flowing back into the esophagus. -Keep your head elevated while you sleep. Using an extra pillow or two can also help to prevent reflux. -Eat smaller and more frequent meals each day instead of a few large meals. This promotes digestion and can aid in preventing heartburn. -Wear loose-fitting clothes to ease pressure on the stomach, which can worsen heartburn and reflux. -Reduce excess weight around the midsection. This can ease pressure on the stomach. Such pressure can force some stomach contents back up the esophagus.  - Please schedule follow up with GI. [facility] at [facility] [address] [place], [place] [address] [phone]   4.  Return in about 3 months (around 01/24/2023).            Return in about 3 months (around 01/24/2023).  Alesia Morin, MD [facility]

## 2022-10-27 NOTE — Addendum Note (Signed)
Addended by: Elsworth Soho on: 10/27/2022 05:29 PM   Modules accepted: Orders

## 2022-10-28 ENCOUNTER — Other Ambulatory Visit (HOSPITAL_COMMUNITY): Payer: Self-pay | Admitting: Internal Medicine

## 2022-10-28 DIAGNOSIS — R921 Mammographic calcification found on diagnostic imaging of breast: Secondary | ICD-10-CM

## 2022-10-29 ENCOUNTER — Ambulatory Visit (INDEPENDENT_AMBULATORY_CARE_PROVIDER_SITE_OTHER): Payer: 59

## 2022-10-29 DIAGNOSIS — J4540 Moderate persistent asthma, uncomplicated: Secondary | ICD-10-CM | POA: Diagnosis not present

## 2022-10-29 NOTE — Telephone Encounter (Addendum)
Kendra Soto does not have any office visits available. Left voicemail for pt to call back to discuss if stable to be able to do a virtual visit with Megan.

## 2022-10-31 ENCOUNTER — Ambulatory Visit (INDEPENDENT_AMBULATORY_CARE_PROVIDER_SITE_OTHER): Payer: 59

## 2022-10-31 DIAGNOSIS — J309 Allergic rhinitis, unspecified: Secondary | ICD-10-CM | POA: Diagnosis not present

## 2022-10-31 NOTE — Telephone Encounter (Signed)
Dr. Delena Bali has been corrected not to send all her [patients to me. Dr Quentin Mulling  patients are going to first available providers to be distributed evenly between all the providers here, any and all of [place]. She can have an appointment with any provider here, jillian will you tell patient and schedule her thanks

## 2022-11-03 NOTE — Telephone Encounter (Signed)
Pt is currently scheduled for the first available f/u appointment over all the providers.

## 2022-11-05 ENCOUNTER — Ambulatory Visit (INDEPENDENT_AMBULATORY_CARE_PROVIDER_SITE_OTHER): Payer: Self-pay

## 2022-11-05 ENCOUNTER — Other Ambulatory Visit: Payer: Self-pay | Admitting: Family Medicine

## 2022-11-05 DIAGNOSIS — J309 Allergic rhinitis, unspecified: Secondary | ICD-10-CM | POA: Diagnosis not present

## 2022-11-07 ENCOUNTER — Ambulatory Visit (INDEPENDENT_AMBULATORY_CARE_PROVIDER_SITE_OTHER): Payer: 59

## 2022-11-07 DIAGNOSIS — J309 Allergic rhinitis, unspecified: Secondary | ICD-10-CM

## 2022-11-10 ENCOUNTER — Other Ambulatory Visit: Payer: Self-pay | Admitting: Cardiology

## 2022-11-12 ENCOUNTER — Other Ambulatory Visit (HOSPITAL_COMMUNITY): Payer: Self-pay

## 2022-11-12 ENCOUNTER — Encounter: Payer: Self-pay | Admitting: Adult Health

## 2022-11-12 ENCOUNTER — Ambulatory Visit (INDEPENDENT_AMBULATORY_CARE_PROVIDER_SITE_OTHER): Payer: Self-pay

## 2022-11-12 ENCOUNTER — Telehealth: Payer: Self-pay | Admitting: *Deleted

## 2022-11-12 DIAGNOSIS — J309 Allergic rhinitis, unspecified: Secondary | ICD-10-CM

## 2022-11-12 NOTE — Telephone Encounter (Signed)
Insurance does not want to cover Aimovig unless patient has tried [employer] Dike, or [employer]. Patient was originally prescribed [place] but insurance would not cover. She has not tried Nurse, children's. Are there any contraindications for those medications?  She has been stable on Aimovig. Would you like to prescribe an alternative medication? We can appeal for patient's continuity of care, if desired.

## 2022-11-12 NOTE — Telephone Encounter (Signed)
Can you try Aimovig PA for patient asap?

## 2022-11-12 NOTE — Telephone Encounter (Signed)
   They would like for PT to try an alternative listed above-If the PT is unable to try any of these alternatives please provide reason/rationale.

## 2022-11-13 NOTE — Telephone Encounter (Signed)
Okay to switch to Emgality or Ajovy if the patient is amenable

## 2022-11-13 NOTE — Telephone Encounter (Signed)
Messaged the patient back in her mychart encounter.

## 2022-11-14 ENCOUNTER — Ambulatory Visit (INDEPENDENT_AMBULATORY_CARE_PROVIDER_SITE_OTHER): Payer: Self-pay

## 2022-11-14 DIAGNOSIS — J309 Allergic rhinitis, unspecified: Secondary | ICD-10-CM

## 2022-11-19 ENCOUNTER — Ambulatory Visit (INDEPENDENT_AMBULATORY_CARE_PROVIDER_SITE_OTHER): Payer: BC Managed Care – PPO

## 2022-11-19 DIAGNOSIS — J309 Allergic rhinitis, unspecified: Secondary | ICD-10-CM | POA: Diagnosis not present

## 2022-11-21 ENCOUNTER — Ambulatory Visit (INDEPENDENT_AMBULATORY_CARE_PROVIDER_SITE_OTHER): Payer: BC Managed Care – PPO

## 2022-11-21 DIAGNOSIS — J309 Allergic rhinitis, unspecified: Secondary | ICD-10-CM

## 2022-11-25 ENCOUNTER — Encounter: Payer: Self-pay | Admitting: Allergy & Immunology

## 2022-11-26 ENCOUNTER — Ambulatory Visit (INDEPENDENT_AMBULATORY_CARE_PROVIDER_SITE_OTHER): Payer: BC Managed Care – PPO

## 2022-11-26 DIAGNOSIS — J309 Allergic rhinitis, unspecified: Secondary | ICD-10-CM | POA: Diagnosis not present

## 2022-11-27 MED ORDER — EMGALITY 120 MG/ML ~~LOC~~ SOAJ: 120.0000 mg | SUBCUTANEOUS | 11 refills | Status: DC

## 2022-11-27 NOTE — Addendum Note (Signed)
Addended by: Bertram Savin on: 11/27/2022 02:13 PM   Modules accepted: Orders

## 2022-11-28 ENCOUNTER — Ambulatory Visit (INDEPENDENT_AMBULATORY_CARE_PROVIDER_SITE_OTHER): Payer: BC Managed Care – PPO

## 2022-11-28 DIAGNOSIS — J309 Allergic rhinitis, unspecified: Secondary | ICD-10-CM | POA: Diagnosis not present

## 2022-12-03 ENCOUNTER — Ambulatory Visit (INDEPENDENT_AMBULATORY_CARE_PROVIDER_SITE_OTHER): Payer: BC Managed Care – PPO

## 2022-12-03 ENCOUNTER — Telehealth: Payer: Self-pay | Admitting: *Deleted

## 2022-12-03 DIAGNOSIS — J309 Allergic rhinitis, unspecified: Secondary | ICD-10-CM | POA: Diagnosis not present

## 2022-12-03 NOTE — Telephone Encounter (Signed)
Can you look into Emgality PA for patient?

## 2022-12-04 ENCOUNTER — Other Ambulatory Visit (HOSPITAL_COMMUNITY): Payer: Self-pay

## 2022-12-04 ENCOUNTER — Telehealth: Payer: Self-pay

## 2022-12-04 NOTE — Telephone Encounter (Signed)
PA request has been Submitted. New Encounter created for follow up. For additional info see Pharmacy Prior Auth telephone encounter from 12/04/2022.

## 2022-12-04 NOTE — Telephone Encounter (Signed)
Pharmacy Patient Advocate Encounter   Received notification from Physician's Office that prior authorization for Emgality 120MG /ML auto-injectors (migraine) is required/requested.   Insurance verification completed.   The patient is insured through [facility] .   Per test claim: PA required; PA submitted to [facility] via CoverMyMeds Key/confirmation #/[id number] Status is pending

## 2022-12-05 ENCOUNTER — Ambulatory Visit (INDEPENDENT_AMBULATORY_CARE_PROVIDER_SITE_OTHER): Payer: BC Managed Care – PPO

## 2022-12-05 ENCOUNTER — Other Ambulatory Visit (HOSPITAL_COMMUNITY): Payer: Self-pay

## 2022-12-05 DIAGNOSIS — J309 Allergic rhinitis, unspecified: Secondary | ICD-10-CM

## 2022-12-05 NOTE — Telephone Encounter (Signed)
Pharmacy Patient Advocate Encounter  Received notification from [facility] that Prior Authorization for Emgality 120MG /ML auto-injectors (migraine)  has been APPROVED from 12/04/2022 to 03/05/2023. Ran test claim, Copay is $30.00. This test claim was processed through [facility]- copay amounts may vary at other pharmacies due to pharmacy/plan contracts, or as the patient moves through the different stages of their insurance plan.

## 2022-12-09 ENCOUNTER — Encounter (HOSPITAL_COMMUNITY): Payer: Self-pay

## 2022-12-09 ENCOUNTER — Ambulatory Visit (HOSPITAL_COMMUNITY)
Admission: RE | Admit: 2022-12-09 | Discharge: 2022-12-09 | Discharge status: Home / Self Care | Payer: BC Managed Care – PPO | Source: Ambulatory Visit | Attending: Internal Medicine | Admitting: Internal Medicine

## 2022-12-09 ENCOUNTER — Ambulatory Visit (HOSPITAL_COMMUNITY)
Admission: RE | Admit: 2022-12-09 | Discharge: 2022-12-09 | Disposition: A | Discharge status: Home / Self Care | Payer: BC Managed Care – PPO | Source: Ambulatory Visit | Attending: Internal Medicine | Admitting: Internal Medicine

## 2022-12-09 DIAGNOSIS — R921 Mammographic calcification found on diagnostic imaging of breast: Secondary | ICD-10-CM

## 2022-12-10 ENCOUNTER — Ambulatory Visit (INDEPENDENT_AMBULATORY_CARE_PROVIDER_SITE_OTHER): Payer: BC Managed Care – PPO

## 2022-12-10 DIAGNOSIS — J309 Allergic rhinitis, unspecified: Secondary | ICD-10-CM

## 2022-12-12 ENCOUNTER — Ambulatory Visit (INDEPENDENT_AMBULATORY_CARE_PROVIDER_SITE_OTHER): Payer: BC Managed Care – PPO

## 2022-12-12 DIAGNOSIS — J309 Allergic rhinitis, unspecified: Secondary | ICD-10-CM | POA: Diagnosis not present

## 2022-12-17 ENCOUNTER — Ambulatory Visit (INDEPENDENT_AMBULATORY_CARE_PROVIDER_SITE_OTHER): Payer: BC Managed Care – PPO

## 2022-12-17 DIAGNOSIS — J309 Allergic rhinitis, unspecified: Secondary | ICD-10-CM | POA: Diagnosis not present

## 2022-12-19 ENCOUNTER — Ambulatory Visit (INDEPENDENT_AMBULATORY_CARE_PROVIDER_SITE_OTHER): Payer: BC Managed Care – PPO

## 2022-12-19 DIAGNOSIS — J309 Allergic rhinitis, unspecified: Secondary | ICD-10-CM | POA: Diagnosis not present

## 2022-12-24 ENCOUNTER — Ambulatory Visit (INDEPENDENT_AMBULATORY_CARE_PROVIDER_SITE_OTHER): Payer: BC Managed Care – PPO

## 2022-12-24 ENCOUNTER — Ambulatory Visit: Payer: 59 | Admitting: Psychiatry

## 2022-12-24 DIAGNOSIS — J309 Allergic rhinitis, unspecified: Secondary | ICD-10-CM | POA: Diagnosis not present

## 2022-12-25 ENCOUNTER — Ambulatory Visit: Payer: 59 | Admitting: Neurology

## 2022-12-26 ENCOUNTER — Ambulatory Visit (INDEPENDENT_AMBULATORY_CARE_PROVIDER_SITE_OTHER): Payer: BC Managed Care – PPO

## 2022-12-26 DIAGNOSIS — J309 Allergic rhinitis, unspecified: Secondary | ICD-10-CM

## 2022-12-31 ENCOUNTER — Ambulatory Visit (INDEPENDENT_AMBULATORY_CARE_PROVIDER_SITE_OTHER): Payer: BC Managed Care – PPO

## 2022-12-31 DIAGNOSIS — J309 Allergic rhinitis, unspecified: Secondary | ICD-10-CM | POA: Diagnosis not present

## 2023-01-07 ENCOUNTER — Ambulatory Visit (INDEPENDENT_AMBULATORY_CARE_PROVIDER_SITE_OTHER): Payer: BC Managed Care – PPO

## 2023-01-07 DIAGNOSIS — J309 Allergic rhinitis, unspecified: Secondary | ICD-10-CM

## 2023-01-14 ENCOUNTER — Ambulatory Visit (INDEPENDENT_AMBULATORY_CARE_PROVIDER_SITE_OTHER): Payer: BC Managed Care – PPO

## 2023-01-14 DIAGNOSIS — J309 Allergic rhinitis, unspecified: Secondary | ICD-10-CM

## 2023-01-15 ENCOUNTER — Encounter: Payer: Self-pay | Admitting: *Deleted

## 2023-01-15 ENCOUNTER — Ambulatory Visit: Payer: BC Managed Care – PPO | Admitting: Internal Medicine

## 2023-01-15 ENCOUNTER — Encounter: Payer: Self-pay | Admitting: Internal Medicine

## 2023-01-15 VITALS — BP 126/78 | HR 68 | Temp 98.4°F | Ht 65.0 in | Wt 193.6 lb

## 2023-01-15 DIAGNOSIS — K219 Gastro-esophageal reflux disease without esophagitis: Secondary | ICD-10-CM

## 2023-01-15 DIAGNOSIS — Z1211 Encounter for screening for malignant neoplasm of colon: Secondary | ICD-10-CM

## 2023-01-15 DIAGNOSIS — R1310 Dysphagia, unspecified: Secondary | ICD-10-CM | POA: Diagnosis not present

## 2023-01-15 DIAGNOSIS — R1319 Other dysphagia: Secondary | ICD-10-CM

## 2023-01-15 MED ORDER — PANTOPRAZOLE SODIUM 40 MG PO TBEC: 40.0000 mg | DELAYED_RELEASE_TABLET | Freq: Two times a day (BID) | ORAL | 11 refills | Status: AC

## 2023-01-15 NOTE — Progress Notes (Signed)
Referring Provider: Donetta Potts, MD Primary Care Physician:  Donetta Potts, MD Primary GI:  Dr. Marletta Lor  Chief Complaint  Patient presents with   Dysphagia    Pt having trouble swallowing and a lot of mucus gathering in her throat for past few months    HPI:   Kendra Soto is a 47 y.o. female who presents for follow-up visit.  Has not been seen in our office since 2020.  History of chronic asthma, rhinoconjunctivitis, GERD.  Currently on omeprazole 40 mg daily.  Notes issues with esophageal dysphagia.  Feels like food gets stuck in her substernal region at times.  Notes breakthrough acid reflux symptoms despite PPI therapy. Feels like she has mucus in her esophagus.   EGD 04/30/2018 normal esophagus, normal stomach, normal duodenum. Underwent Empiric esophageal dilation.   Screening colonoscopy 12/03/2021 internal hemorrhoids, diverticulosis of the sigmoid colon, 4 mm polyp removed from the sigmoid colon.  Benign hyperplastic.  10-year recall given.  Past Medical History:  Diagnosis Date   Allergies    Asthma    Dysrhythmia    GERD (gastroesophageal reflux disease)    History of kidney stones    Hx of migraine headaches    Hypothyroid    Overactive bladder    Palpitations     Past Surgical History:  Procedure Laterality Date   CHOLECYSTECTOMY  2016   COLONOSCOPY WITH PROPOFOL N/A 12/03/2021   Procedure: COLONOSCOPY WITH PROPOFOL;  Surgeon: Lanelle Bal, DO;  Location: [facility];  Service: Endoscopy;  Laterality: N/A;  10:30am, asa 2   ESOPHAGOGASTRODUODENOSCOPY N/A 04/30/2018   normal. Empiric dilatation   MALONEY DILATION N/A 04/30/2018   Procedure: Elease Hashimoto DILATION;  Surgeon: Corbin Ade, MD;  Location: [facility];  Service: Endoscopy;  Laterality: N/A;   POLYPECTOMY  12/03/2021   Procedure: POLYPECTOMY;  Surgeon: Lanelle Bal, DO;  Location: [facility];  Service: Endoscopy;;   TUBAL LIGATION     VAGINAL HYSTERECTOMY N/A 01/22/2021    Procedure: HYSTERECTOMY VAGINAL;  Surgeon: Myna Hidalgo, DO;  Location: [facility];  Service: Gynecology;  Laterality: N/A;   WISDOM TOOTH EXTRACTION      Current Outpatient Medications  Medication Sig Dispense Refill   albuterol (VENTOLIN HFA) 108 (90 Base) MCG/ACT inhaler INHALE TWO PUFFS INTO THE LUNGS EVERY 6 HOURS AS NEEDED FOR WHEEZING OR SHORTNESS OF BREATH 8.5 g 1   azelastine (ASTELIN) 0.1 % nasal spray Place 2 sprays into both nostrils 2 (two) times daily as needed for rhinitis. Use in each nostril as directed 30 mL 5   clobetasol ointment (TEMOVATE) 0.05 % Apply 1 Application topically 2 (two) times daily as needed (irritation).     EPINEPHrine (EPIPEN 2-PAK) 0.3 mg/0.3 mL IJ SOAJ injection Inject 0.3 mg into the muscle as needed for anaphylaxis. 2 each 1   flecainide (TAMBOCOR) 50 MG tablet TAKE 1 TABLET BY MOUTH TWICE DAILY 180 tablet 3   fluticasone (FLONASE) 50 MCG/ACT nasal spray Place into both nostrils.     fluticasone 0.05%-ketoconazole 2% 1:1 cream mixture Apply 1 application  topically daily as needed for irritation.     fluticasone-salmeterol (WIXELA INHUB) 500-50 MCG/ACT AEPB Inhale 1 puff into the lungs 2 (two) times daily. in the morning and at bedtime. 60 each 5   Galcanezumab-gnlm (EMGALITY) 120 MG/ML SOAJ Inject 120 mg into the skin every 30 (thirty) days. 1 mL 11   ipratropium (ATROVENT) 0.03 % nasal spray Place 1 spray into both nostrils  3 (three) times daily as needed for rhinitis. 30 mL 5   levocetirizine (XYZAL) 5 MG tablet Take 1 tablet (5 mg total) by mouth daily as needed for allergies (Can take an extra dose during flare ups.). 60 tablet 5   levothyroxine (SYNTHROID) 75 MCG tablet Take 75 mcg by mouth daily before breakfast.     montelukast (SINGULAIR) 10 MG tablet Take 1 tablet (10 mg total) by mouth at bedtime. 30 tablet 5   omeprazole (PRILOSEC) 40 MG capsule Take 1 capsule (40 mg total) by mouth in the morning. 30 capsule 5   Rimegepant Sulfate (NURTEC)  75 MG TBDP Take 1 tablet at the onset of migraine. 1 tablet in 24 hours. 16 tablet 11   Spacer/Aero-Holding Chambers DEVI 1 Device by Does not apply route as directed. 1 each 1   SPIRIVA RESPIMAT 1.25 MCG/ACT AERS Inhale 2 puffs into the lungs daily. 1 each 5   No current facility-administered medications for this visit.    Allergies as of 01/15/2023 - Review Complete 10/24/2022  Allergen Reaction Noted   Bee pollen Cough, Other (See Comments), and Shortness Of Breath 06/07/2021   Short ragweed pollen ext Cough, Other (See Comments), and Shortness Of Breath 06/07/2021    Family History  Problem Relation Age of Onset   Breast cancer Mother    Hypertension Mother    Migraines Mother    Allergic rhinitis Father    Cancer Father        prostate   Hypertension Father    Migraines Sister    Migraines Sister    Rheum arthritis Sister    Migraines Daughter    Headache Daughter    Allergic rhinitis Brother    Headache Son    Colon cancer Neg Hx    Celiac disease Neg Hx    Crohn's disease Neg Hx    Colon polyps Neg Hx     Social History   Socioeconomic History   Marital status: Married    Spouse name: Not on file   Number of children: 3   Years of education: Not on file   Highest education level: Not on file  Occupational History   Not on file  Tobacco Use   Smoking status: Former    Current packs/day: 0.00    Average packs/day: 1 pack/day for 20.0 years (20.0 ttl pk-yrs)    Types: Cigarettes    Start date: 26    Quit date: 2009    Years since quitting: 15.8   Smokeless tobacco: Never  Vaping Use   Vaping status: Never Used  Substance and Sexual Activity   Alcohol use: No   Drug use: No   Sexual activity: Yes    Birth control/protection: Surgical    Comment: hyst  Other Topics Concern   Not on file  Social History Narrative   Caffeine: max of 1 cup in a day, mostly avoids    Social Determinants of Health   Financial Resource Strain: High Risk (03/05/2022)    Overall Financial Resource Strain (CARDIA)    Difficulty of Paying Living Expenses: Hard  Food Insecurity: Food Insecurity Present (03/05/2022)   Hunger Vital Sign    Worried About Running Out of Food in the Last Year: Sometimes true    Ran Out of Food in the Last Year: Never true  Transportation Needs: No Transportation Needs (03/05/2022)   PRAPARE - Administrator, Civil Service (Medical): No    Lack of Transportation (Non-Medical): No  Physical Activity: Inactive (03/05/2022)   Exercise Vital Sign    Days of Exercise per Week: 0 days    Minutes of Exercise per Session: 0 min  Stress: Stress Concern Present (03/05/2022)   [employer] of Occupational Health - Occupational Stress Questionnaire    Feeling of Stress : Rather much  Social Connections: Moderately Integrated (03/05/2022)   Social Connection and Isolation Panel [NHANES]    Frequency of Communication with Friends and Family: More than three times a week    Frequency of Social Gatherings with Friends and Family: Twice a week    Attends Religious Services: More than 4 times per year    Active Member of [employer] or Organizations: No    Attends Banker Meetings: Never    Marital Status: Married    Subjective: Review of Systems  Constitutional:  Negative for chills and fever.  HENT:  Negative for congestion and hearing loss.   Eyes:  Negative for blurred vision and double vision.  Respiratory:  Negative for cough and shortness of breath.   Cardiovascular:  Negative for chest pain and palpitations.  Gastrointestinal:  Positive for heartburn. Negative for abdominal pain, blood in stool, constipation, diarrhea, melena and vomiting.       Dysphagia  Genitourinary:  Negative for dysuria and urgency.  Musculoskeletal:  Negative for joint pain and myalgias.  Skin:  Negative for itching and rash.  Neurological:  Negative for dizziness and headaches.  Psychiatric/Behavioral:  Negative for depression.  The patient is not nervous/anxious.      Objective: LMP 01/22/2021 Comment: Pregnancy test negative on 01/18/2021 Physical Exam Constitutional:      Appearance: Normal appearance.  HENT:     Head: Normocephalic and atraumatic.  Eyes:     Extraocular Movements: Extraocular movements intact.     Conjunctiva/sclera: Conjunctivae normal.  Cardiovascular:     Rate and Rhythm: Normal rate and regular rhythm.  Pulmonary:     Effort: Pulmonary effort is normal.     Breath sounds: Normal breath sounds.  Abdominal:     General: Bowel sounds are normal.     Palpations: Abdomen is soft.  Musculoskeletal:        General: No swelling. Normal range of motion.     Cervical back: Normal range of motion and neck supple.  Skin:    General: Skin is warm and dry.     Coloration: Skin is not jaundiced.  Neurological:     General: No focal deficit present.     Mental Status: She is alert and oriented to person, place, and time.  Psychiatric:        Mood and Affect: Mood normal.        Behavior: Behavior normal.      Assessment: *Chronic GERD-uncontrolled on omeprazole 40 mg daily *Esophageal dysphagia *Colon cancer screening  Plan: Will schedule for EGD with possible dilation to evaluate for peptic ulcer disease, esophagitis, gastritis, H. Pylori, duodenitis, or other. Will also evaluate for esophageal stricture, Schatzki's ring, esophageal web or other.   The risks including infection, bleed, or perforation as well as benefits, limitations, alternatives and imponderables have been reviewed with the patient. Potential for esophageal dilation, biopsy, etc. have also been reviewed.  Questions have been answered. All parties agreeable.  Will change Omeprazole to Pantoprazole 40 mg BID for 10-12 weeks at which point we can hopefully wean down to once daily.   Colonoscopy recall 2033 for colon cancer screening.   01/15/2023 9:55 AM   Disclaimer:  This note was dictated with voice  recognition software. Similar sounding words can inadvertently be transcribed and may not be corrected upon review.

## 2023-01-15 NOTE — Patient Instructions (Signed)
We will schedule you for upper endoscopy to further evaluate your acid reflux and feeling of food stuck in your esophagus.  I may elect to stretch your esophagus depending on findings.  I am going to change your omeprazole to pantoprazole 40 mg twice daily.  The first dose I would recommend taking at least 30 minutes before breakfast.  The second dose you can take 30 minutes for dinner or at bedtime.  It was very nice seeing you again today.  Dr. Marletta Lor

## 2023-01-21 ENCOUNTER — Encounter: Payer: Self-pay | Admitting: Allergy & Immunology

## 2023-01-21 NOTE — Telephone Encounter (Signed)
Per [employer] " Pre-Authorization is not Required"

## 2023-01-26 ENCOUNTER — Encounter: Payer: Self-pay | Admitting: Internal Medicine

## 2023-01-26 ENCOUNTER — Ambulatory Visit: Payer: BC Managed Care – PPO | Admitting: Internal Medicine

## 2023-01-26 VITALS — BP 110/70 | HR 95 | Temp 98.4°F | Resp 18 | Wt 194.0 lb

## 2023-01-26 DIAGNOSIS — J3089 Other allergic rhinitis: Secondary | ICD-10-CM

## 2023-01-26 DIAGNOSIS — J302 Other seasonal allergic rhinitis: Secondary | ICD-10-CM

## 2023-01-26 DIAGNOSIS — J4540 Moderate persistent asthma, uncomplicated: Secondary | ICD-10-CM

## 2023-01-26 MED ORDER — MONTELUKAST SODIUM 10 MG PO TABS: 10.0000 mg | ORAL_TABLET | Freq: Every day | ORAL | 5 refills | Status: DC

## 2023-01-26 MED ORDER — ALBUTEROL SULFATE HFA 108 (90 BASE) MCG/ACT IN AERS: 1.0000 | INHALATION_SPRAY | Freq: Four times a day (QID) | RESPIRATORY_TRACT | 1 refills | Status: DC | PRN

## 2023-01-26 MED ORDER — AZELASTINE HCL 0.1 % NA SOLN: 1.0000 | Freq: Two times a day (BID) | NASAL | 5 refills | Status: DC | PRN

## 2023-01-26 MED ORDER — FLUTICASONE-SALMETEROL 500-50 MCG/ACT IN AEPB: 1.0000 | INHALATION_SPRAY | Freq: Two times a day (BID) | RESPIRATORY_TRACT | 5 refills | Status: DC

## 2023-01-26 MED ORDER — LEVOCETIRIZINE DIHYDROCHLORIDE 5 MG PO TABS: 5.0000 mg | ORAL_TABLET | Freq: Every day | ORAL | 5 refills | Status: DC | PRN

## 2023-01-26 NOTE — Patient Instructions (Addendum)
1. Moderate persistent asthma, uncomplicated - Will do spirometry at next visit. Controlled, some recent coughing with illness but no wheezing/SOB.  - Daily controller medication(s): Advair 500/48mcg 1 puff once daily and Spiriva 1.46mcg 2 puffs once daily. If symptoms worsen, increase Advair to 1 puff twice daily.  - Rescue medications: Albuterol 1-2 puffs every 4-6 hours as needed. - Asthma control goals:  * Full participation in all desired activities (may need albuterol before activity) * Albuterol use two time or less a week on average (not counting use with activity) * Cough interfering with sleep two time or less a month * Oral steroids no more than once a year * No hospitalizations  2. Allergic rhinitis (grasses, trees, indoor molds, outdoor molds, dust mites, and cat) - Avoidance measures discussed. - Use nasal saline rinses before nose sprays such as with Neilmed Sinus Rinse.  Use distilled water.  . - Use Azelastine 1-2 sprays each nostril twice daily as needed for runny nose, drainage, sneezing, congestion. Aim upward and outward. - Use Ipratroprium 1-2 sprays up to four times daily as needed for runny nose. Aim upward and outward. Be careful as this can be drying.  - Use Xyzal 5mg  daily as needed for runny nose, sneezing, itchy watery eyes.  On shot days, take Xyzal 5mg  daily.   - Use Singulair 10mg  daily. Stop if there are any mood/behavioral changes. - For eyes, use Olopatadine or Ketotifen 1 eye drop daily as needed for itchy, watery eyes.  Available over the counter, if not covered by insurance.  - Continue allergy shots on schedule.  AIT initiated 09/2022, reached red vial 10/204. Keep Epipen.   3. GERD -Continue follow up with GI.

## 2023-01-26 NOTE — Progress Notes (Signed)
FOLLOW UP Date of Service/Encounter:  01/26/23   Subjective:  Kendra Soto (DOB: 1975-09-17) is a 47 y.o. female who [facility] for follow up for moderate persistent asthma, allergic rhinoconjunctivitis and GERD   History obtained from: chart review and patient. Last seen on 10/24/2022 with me. Asthma- controlled on Advair, Singulair, Spiriva. Rhinitis- on AIT, having trouble with dryness so backed off nose sprays/anti histamines.  GERD- uncontrolled on PPI/Pepcid, discussed seeing GI.   About 1.5 weeks ago, she reports getting sick.  Had increased congestion, drainage, cough and then it moved "into her chest."  No wheezing or SOB though.  Saw PCP and was Flu/Covid/RSV negative.  Given prednisone and tessalon perles without improvement.  Still continued to have lots of cough and mucous production so started on doxycycline, last dose on Wednesday.  Asthma has done well overall.  Did use albuterol for a few days for this illness for a cough but no other breathing troubles.  Outside of this, she rarely needs the albuterol. She was also doing a lot more exercises before to help strengthen her lungs.  Using Advair, Spiriva and Singulair once daily.  Allergies have been doing okay, on and off. Recently worse symptoms with the illness though.  Taking Azelastine more regularly the past week due to post nasal drip.  Doing singulair daily and Xyzal PRN and on shot days. Has an Epipen.  Held off on her allergy shots while sick.    Past Medical History: Past Medical History:  Diagnosis Date   Allergies    Asthma    Dysrhythmia    GERD (gastroesophageal reflux disease)    History of kidney stones    Hx of migraine headaches    Hypothyroid    Overactive bladder    Palpitations     Objective:  BP 110/70   Pulse 95   Temp 98.4 F (36.9 C)   Resp 18   Wt 194 lb (88 kg)   LMP 01/22/2021 Comment: Pregnancy test negative on 01/18/2021  SpO2 95%   BMI  32.28 kg/m  Body mass index is 32.28 kg/m. Physical Exam: GEN: alert, well developed HEENT: clear conjunctiva, TM grey and translucent, nose with mild inferior turbinate hypertrophy, pink nasal mucosa, slight clear rhinorrhea, + cobblestoning HEART: regular rate and rhythm, no murmur LUNGS: clear to auscultation bilaterally, no coughing, unlabored respiration SKIN: no rashes or lesions  Assessment:   1. Moderate persistent asthma, uncomplicated   2. Seasonal and perennial allergic rhinitis     Plan/Recommendations:  1. Moderate persistent asthma, uncomplicated - Will do spirometry at next visit. Controlled, some recent coughing with illness but no wheezing/SOB.   - Daily controller medication(s): Advair 500/51mcg 1 puff once daily and Spiriva 1.62mcg 2 puffs once daily. If symptoms worsen, increase Advair to 1 puff twice daily.  - Rescue medications: Albuterol 1-2 puffs every 4-6 hours as needed. - Asthma control goals:  * Full participation in all desired activities (may need albuterol before activity) * Albuterol use two time or less a week on average (not counting use with activity) * Cough interfering with sleep two time or less a month * Oral steroids no more than once a year * No hospitalizations  2. Allergic rhinitis (grasses, trees, indoor molds, outdoor molds, dust mites, and cat) - Avoidance measures discussed. - Use nasal saline rinses before nose sprays such as with Neilmed Sinus Rinse.  Use distilled water.  . - Use Azelastine 1-2 sprays  each nostril twice daily as needed for runny nose, drainage, sneezing, congestion. Aim upward and outward. - Use Ipratroprium 1-2 sprays up to four times daily as needed for runny nose. Aim upward and outward. Be careful as this can be drying.  - Use Xyzal 5mg  daily as needed for runny nose, sneezing, itchy watery eyes.  On shot days, take Xyzal 5mg  daily.   - Use Singulair 10mg  daily. Stop if there are any mood/behavioral changes. -  For eyes, use Olopatadine or Ketotifen 1 eye drop daily as needed for itchy, watery eyes.  Available over the counter, if not covered by insurance.  - Continue allergy shots on schedule.  AIT initiated 09/2022, reached red vial 10/204. Keep Epipen.   3. GERD -Continue follow up with GI. Planning on EGD and had temporarily increased PPI at last visit.            Return in about 3 months (around 04/28/2023).  Alesia Morin, MD [facility]

## 2023-01-30 ENCOUNTER — Ambulatory Visit (INDEPENDENT_AMBULATORY_CARE_PROVIDER_SITE_OTHER): Payer: BC Managed Care – PPO

## 2023-01-30 DIAGNOSIS — J309 Allergic rhinitis, unspecified: Secondary | ICD-10-CM | POA: Diagnosis not present

## 2023-02-04 ENCOUNTER — Ambulatory Visit (INDEPENDENT_AMBULATORY_CARE_PROVIDER_SITE_OTHER): Payer: BC Managed Care – PPO

## 2023-02-04 DIAGNOSIS — J309 Allergic rhinitis, unspecified: Secondary | ICD-10-CM | POA: Diagnosis not present

## 2023-02-11 ENCOUNTER — Ambulatory Visit (INDEPENDENT_AMBULATORY_CARE_PROVIDER_SITE_OTHER): Payer: BC Managed Care – PPO | Admitting: *Deleted

## 2023-02-11 DIAGNOSIS — J309 Allergic rhinitis, unspecified: Secondary | ICD-10-CM

## 2023-02-18 ENCOUNTER — Ambulatory Visit (INDEPENDENT_AMBULATORY_CARE_PROVIDER_SITE_OTHER): Payer: BC Managed Care – PPO

## 2023-02-18 DIAGNOSIS — J309 Allergic rhinitis, unspecified: Secondary | ICD-10-CM

## 2023-02-25 ENCOUNTER — Ambulatory Visit (INDEPENDENT_AMBULATORY_CARE_PROVIDER_SITE_OTHER): Payer: Self-pay

## 2023-02-25 DIAGNOSIS — J309 Allergic rhinitis, unspecified: Secondary | ICD-10-CM

## 2023-02-26 ENCOUNTER — Ambulatory Visit: Payer: 59 | Admitting: Adult Health

## 2023-03-02 ENCOUNTER — Encounter: Payer: Self-pay | Admitting: Internal Medicine

## 2023-03-03 ENCOUNTER — Encounter (HOSPITAL_COMMUNITY): Payer: Self-pay

## 2023-03-03 ENCOUNTER — Encounter (HOSPITAL_COMMUNITY)
Admission: RE | Admit: 2023-03-03 | Discharge: 2023-03-03 | Disposition: A | Discharge status: Home / Self Care | Payer: BC Managed Care – PPO | Source: Ambulatory Visit | Attending: Internal Medicine | Admitting: Internal Medicine

## 2023-03-04 ENCOUNTER — Ambulatory Visit (INDEPENDENT_AMBULATORY_CARE_PROVIDER_SITE_OTHER): Payer: Self-pay

## 2023-03-04 DIAGNOSIS — J309 Allergic rhinitis, unspecified: Secondary | ICD-10-CM

## 2023-03-05 ENCOUNTER — Telehealth: Payer: Self-pay | Admitting: *Deleted

## 2023-03-05 NOTE — Telephone Encounter (Signed)
Pt called back and informed that she went to the doctor, was tested for flu,RSV and flu, everything came back negative. She says she isn't having any problems breathing. She was put on steroid and antibiotics.   Dr.Carver informed and states ok for pt to have procedure tomorrow. Pt informed and verbalized understanding.

## 2023-03-05 NOTE — Telephone Encounter (Signed)
Pt called stating that she has hoariness, throat hurts when coughing and a little mucus but no fever. She has procedure scheduled for tomorrow. She wanted procedure to be done before end of the month.  Spoke with Dr.Carver and he says ok but if pt gets worse will need to reschedule. Pt states she is going to the doctor today and will call back once she finds out what is going on and may have to reschedule.

## 2023-03-06 ENCOUNTER — Encounter (HOSPITAL_COMMUNITY)
Admission: RE | Disposition: A | Discharge status: Home / Self Care | Payer: Self-pay | Source: Home / Self Care | Attending: Internal Medicine

## 2023-03-06 ENCOUNTER — Other Ambulatory Visit: Payer: Self-pay

## 2023-03-06 ENCOUNTER — Ambulatory Visit (HOSPITAL_COMMUNITY): Payer: BC Managed Care – PPO | Admitting: Anesthesiology

## 2023-03-06 ENCOUNTER — Encounter (HOSPITAL_COMMUNITY): Payer: Self-pay

## 2023-03-06 ENCOUNTER — Ambulatory Visit (HOSPITAL_COMMUNITY)
Admission: RE | Admit: 2023-03-06 | Discharge: 2023-03-06 | Disposition: A | Discharge status: Home / Self Care | Payer: BC Managed Care – PPO | Attending: Internal Medicine | Admitting: Internal Medicine

## 2023-03-06 DIAGNOSIS — I10 Essential (primary) hypertension: Secondary | ICD-10-CM | POA: Diagnosis not present

## 2023-03-06 DIAGNOSIS — Z5986 Financial insecurity: Secondary | ICD-10-CM | POA: Insufficient documentation

## 2023-03-06 DIAGNOSIS — K2950 Unspecified chronic gastritis without bleeding: Secondary | ICD-10-CM

## 2023-03-06 DIAGNOSIS — R1310 Dysphagia, unspecified: Secondary | ICD-10-CM | POA: Insufficient documentation

## 2023-03-06 DIAGNOSIS — J45909 Unspecified asthma, uncomplicated: Secondary | ICD-10-CM | POA: Diagnosis not present

## 2023-03-06 DIAGNOSIS — K219 Gastro-esophageal reflux disease without esophagitis: Secondary | ICD-10-CM | POA: Diagnosis not present

## 2023-03-06 DIAGNOSIS — K2970 Gastritis, unspecified, without bleeding: Secondary | ICD-10-CM | POA: Diagnosis present

## 2023-03-06 DIAGNOSIS — Z87891 Personal history of nicotine dependence: Secondary | ICD-10-CM | POA: Insufficient documentation

## 2023-03-06 DIAGNOSIS — K3189 Other diseases of stomach and duodenum: Secondary | ICD-10-CM | POA: Diagnosis not present

## 2023-03-06 HISTORY — PX: ESOPHAGOGASTRODUODENOSCOPY (EGD) WITH PROPOFOL: SHX5813

## 2023-03-06 HISTORY — DX: Unspecified osteoarthritis, unspecified site: M19.90

## 2023-03-06 HISTORY — DX: Prediabetes: R73.03

## 2023-03-06 HISTORY — PX: BALLOON DILATION: SHX5330

## 2023-03-06 HISTORY — PX: BIOPSY: SHX5522

## 2023-03-06 LAB — GLUCOSE, CAPILLARY: Glucose-Capillary: 90 mg/dL (ref 70–99)

## 2023-03-06 SURGERY — ESOPHAGOGASTRODUODENOSCOPY (EGD) WITH PROPOFOL
Anesthesia: General

## 2023-03-06 MED ORDER — PROPOFOL 10 MG/ML IV BOLUS
INTRAVENOUS | Status: DC | PRN
  Administered 2023-03-06: 100 mg via INTRAVENOUS
  Administered 2023-03-06: 30 mg via INTRAVENOUS

## 2023-03-06 MED ORDER — LIDOCAINE HCL (CARDIAC) PF 100 MG/5ML IV SOSY
PREFILLED_SYRINGE | INTRAVENOUS | Status: DC | PRN
  Administered 2023-03-06: 100 mg via INTRAVENOUS

## 2023-03-06 MED ORDER — LACTATED RINGERS IV SOLN: INTRAVENOUS | Status: DC

## 2023-03-06 MED ORDER — PROPOFOL 500 MG/50ML IV EMUL
INTRAVENOUS | Status: DC | PRN
  Administered 2023-03-06: 150 ug/kg/min via INTRAVENOUS

## 2023-03-06 MED ORDER — LIDOCAINE HCL (PF) 2 % IJ SOLN
INTRAMUSCULAR | Status: AC
  Filled 2023-03-06: qty 5

## 2023-03-06 NOTE — Discharge Instructions (Addendum)
EGD Discharge instructions Please read the instructions outlined below and refer to this sheet in the next few weeks. These discharge instructions provide you with general information on caring for yourself after you leave the hospital. Your doctor may also give you specific instructions. While your treatment has been planned according to the most current medical practices available, unavoidable complications occasionally occur. If you have any problems or questions after discharge, please call your doctor. ACTIVITY You may resume your regular activity but move at a slower pace for the next 24 hours.  Take frequent rest periods for the next 24 hours.  Walking will help expel (get rid of) the air and reduce the bloated feeling in your abdomen.  No driving for 24 hours (because of the anesthesia (medicine) used during the test).  You may shower.  Do not sign any important legal documents or operate any machinery for 24 hours (because of the anesthesia used during the test).  NUTRITION Drink plenty of fluids.  You may resume your normal diet.  Begin with a light meal and progress to your normal diet.  Avoid alcoholic beverages for 24 hours or as instructed by your caregiver.  MEDICATIONS You may resume your normal medications unless your caregiver tells you otherwise.  WHAT YOU CAN EXPECT TODAY You may experience abdominal discomfort such as a feeling of fullness or "gas" pains.  FOLLOW-UP Your doctor will discuss the results of your test with you.  SEEK IMMEDIATE MEDICAL ATTENTION IF ANY OF THE FOLLOWING OCCUR: Excessive nausea (feeling sick to your stomach) and/or vomiting.  Severe abdominal pain and distention (swelling).  Trouble swallowing.  Temperature over 101 F (37.8 C).  Rectal bleeding or vomiting of blood.   Your EGD revealed mild amount inflammation in your stomach.  I took biopsies of this to rule out infection with a bacteria called H. pylori.  Await pathology results, my  office will contact you.  I did stretch your esophagus.  Small bowel was normal.  Continue on pantoprazole twice daily.  Follow-up in GI office in 3 months.   I hope you have a great rest of your week!  Hennie Duos. Marletta Lor, D.O. Gastroenterology and Hepatology [facility]

## 2023-03-06 NOTE — Transfer of Care (Addendum)
Immediate Anesthesia Transfer of Care Note  Patient: Kendra Soto  Procedure(s) Performed: ESOPHAGOGASTRODUODENOSCOPY (EGD) WITH PROPOFOL BALLOON DILATION BIOPSY  Patient Location: Short Stay  Anesthesia Type:General  Level of Consciousness: drowsy and patient cooperative  Airway & Oxygen Therapy: Patient Spontanous Breathing and Patient connected to nasal cannula oxygen  Post-op Assessment: Report given to RN and Post -op Vital signs reviewed and stable  Post vital signs: Reviewed and stable  Last Vitals:  Vitals Value Taken Time  BP    Temp 36.8 03/06/23   1002  Pulse 78 03/06/23 1002  Resp 16 03/06/23 1002  SpO2 100 % 03/06/23 1002  Vitals shown include unfiled device data.  Last Pain:  Vitals:   03/06/23 0949  TempSrc:   PainSc: 0-No pain         Complications: No notable events documented.

## 2023-03-06 NOTE — Anesthesia Preprocedure Evaluation (Signed)
Anesthesia Evaluation  Patient identified by MRN, date of birth, ID band Patient awake    Reviewed: Allergy & Precautions, H&P , NPO status , Patient's Chart, lab work & pertinent test results, reviewed documented beta blocker date and time   Airway Mallampati: II  TM Distance: >3 FB Neck ROM: full    Dental no notable dental hx.    Pulmonary neg pulmonary ROS, asthma , former smoker   Pulmonary exam normal breath sounds clear to auscultation       Cardiovascular Exercise Tolerance: Good hypertension, negative cardio ROS + dysrhythmias  Rhythm:regular Rate:Normal     Neuro/Psych negative neurological ROS  negative psych ROS   GI/Hepatic negative GI ROS, Neg liver ROS,GERD  ,,  Endo/Other  negative endocrine ROSHypothyroidism    Renal/GU negative Renal ROS  negative genitourinary   Musculoskeletal   Abdominal   Peds  Hematology negative hematology ROS (+)   Anesthesia Other Findings   Reproductive/Obstetrics negative OB ROS                             Anesthesia Physical Anesthesia Plan  ASA: 2  Anesthesia Plan: General   Post-op Pain Management:    Induction:   PONV Risk Score and Plan: Propofol infusion  Airway Management Planned:   Additional Equipment:   Intra-op Plan:   Post-operative Plan:   Informed Consent: I have reviewed the patients History and Physical, chart, labs and discussed the procedure including the risks, benefits and alternatives for the proposed anesthesia with the patient or authorized representative who has indicated his/her understanding and acceptance.     Dental Advisory Given  Plan Discussed with: CRNA  Anesthesia Plan Comments:        Anesthesia Quick Evaluation

## 2023-03-06 NOTE — H&P (Signed)
Primary Care Physician:  Donetta Potts, MD Primary Gastroenterologist:  Dr. Marletta Lor  Pre-Procedure History & Physical: HPI:  Kendra Soto is a 47 y.o. female is here for an EGD with possible dilation due to history of dysphagia, GERD  Past Medical History:  Diagnosis Date   Allergies    Arthritis    Asthma    Dysrhythmia    GERD (gastroesophageal reflux disease)    History of kidney stones    Hx of migraine headaches    Hypothyroid    Overactive bladder    Palpitations    Pre-diabetes     Past Surgical History:  Procedure Laterality Date   CHOLECYSTECTOMY  2016   COLONOSCOPY WITH PROPOFOL N/A 12/03/2021   Procedure: COLONOSCOPY WITH PROPOFOL;  Surgeon: Lanelle Bal, DO;  [facility]: [facility];  Service: Endoscopy;  Laterality: N/A;  10:30am, asa 2   ESOPHAGOGASTRODUODENOSCOPY N/A 04/30/2018   normal. Empiric dilatation   MALONEY DILATION N/A 04/30/2018   Procedure: Kendra Soto DILATION;  Surgeon: Corbin Ade, MD;  Location: [facility];  Service: Endoscopy;  Laterality: N/A;   POLYPECTOMY  12/03/2021   Procedure: POLYPECTOMY;  Surgeon: Lanelle Bal, DO;  Location: [facility];  Service: Endoscopy;;   TUBAL LIGATION     VAGINAL HYSTERECTOMY N/A 01/22/2021   Procedure: HYSTERECTOMY VAGINAL;  Surgeon: Myna Hidalgo, DO;  Location: [facility];  Service: Gynecology;  Laterality: N/A;   WISDOM TOOTH EXTRACTION      Prior to Admission medications   Medication Sig Start Date End Date Taking? Authorizing Provider  albuterol (VENTOLIN HFA) 108 (90 Base) MCG/ACT inhaler Inhale 1-2 puffs into the lungs every 6 (six) hours as needed for wheezing or shortness of breath. 01/26/23  Yes Patel, Ellen Henri, MD  benzonatate (TESSALON) 100 MG capsule Take 100 mg by mouth 3 (three) times daily. 01/19/23  Yes [provider]  doxycycline (VIBRA-TABS) 100 MG tablet Take 100 mg by mouth 2 (two) times daily. 01/22/23  Yes [provider]  flecainide (TAMBOCOR) 50 MG  tablet TAKE 1 TABLET BY MOUTH TWICE DAILY 11/10/22  Yes Jake Bathe, MD  fluticasone-salmeterol (WIXELA INHUB) 500-50 MCG/ACT AEPB Inhale 1 puff into the lungs 2 (two) times daily. in the morning and at bedtime. 01/26/23  Yes Patel, Ellen Henri, MD  ipratropium (ATROVENT) 0.03 % nasal spray Place 1 spray into both nostrils 3 (three) times daily as needed for rhinitis. 10/24/22  Yes Birder Robson, MD  levocetirizine (XYZAL) 5 MG tablet Take 1 tablet (5 mg total) by mouth daily as needed for allergies (Can take an extra dose during flare ups.). 01/26/23  Yes Birder Robson, MD  levothyroxine (SYNTHROID) 75 MCG tablet Take 75 mcg by mouth daily before breakfast.   Yes [provider]  montelukast (SINGULAIR) 10 MG tablet Take 1 tablet (10 mg total) by mouth at bedtime. 01/26/23  Yes Birder Robson, MD  oxybutynin (DITROPAN XL) 15 MG 24 hr tablet Take 15 mg by mouth daily. 01/21/23  Yes [provider]  pantoprazole (PROTONIX) 40 MG tablet Take 1 tablet (40 mg total) by mouth 2 (two) times daily. 01/15/23 01/15/24 Yes Marguetta Windish, Hennie Duos, DO  Rimegepant Sulfate (NURTEC) 75 MG TBDP Take 1 tablet at the onset of migraine. 1 tablet in 24 hours. 02/03/22  Yes Butch Penny, NP  azelastine (ASTELIN) 0.1 % nasal spray Place 1 spray into both nostrils 2 (two) times daily as needed for rhinitis. Use in each nostril as directed  01/26/23   Birder Robson, MD  clobetasol ointment (TEMOVATE) 0.05 % Apply 1 Application topically 2 (two) times daily as needed (irritation).    [provider]  EPINEPHrine (EPIPEN 2-PAK) 0.3 mg/0.3 mL IJ SOAJ injection Inject 0.3 mg into the muscle as needed for anaphylaxis. 10/24/22   Birder Robson, MD  fluticasone (FLONASE) 50 MCG/ACT nasal spray Place into both nostrils. Patient not taking: Reported on 01/15/2023 04/22/22   [provider]  fluticasone 0.05%-ketoconazole 2% 1:1 cream mixture Apply 1 application  topically daily as needed for irritation.     [provider]  Galcanezumab-gnlm (EMGALITY) 120 MG/ML SOAJ Inject 120 mg into the skin every 30 (thirty) days. 11/27/22   Butch Penny, NP  Spacer/Aero-Holding Deretha Emory DEVI 1 Device by Does not apply route as directed. 02/14/22   Hetty Blend, FNP  SPIRIVA RESPIMAT 1.25 MCG/ACT AERS Inhale 2 puffs into the lungs daily. 10/24/22 01/26/23  Birder Robson, MD    Allergies as of 01/21/2023 - Review Complete 01/15/2023  Allergen Reaction Noted   Bee pollen Cough, Other (See Comments), and Shortness Of Breath 06/07/2021   Short ragweed pollen ext Cough, Other (See Comments), and Shortness Of Breath 06/07/2021    Family History  Problem Relation Age of Onset   Breast cancer Mother    Hypertension Mother    Migraines Mother    Allergic rhinitis Father    Cancer Father        prostate   Hypertension Father    Migraines Sister    Migraines Sister    Rheum arthritis Sister    Migraines Daughter    Headache Daughter    Allergic rhinitis Brother    Headache Son    Colon cancer Neg Hx    Celiac disease Neg Hx    Crohn's disease Neg Hx    Colon polyps Neg Hx     Social History   Socioeconomic History   Marital status: Married    Spouse name: Not on file   Number of children: 3   Years of education: Not on file   Highest education level: Not on file  Occupational History   Not on file  Tobacco Use   Smoking status: Former    Current packs/day: 0.00    Average packs/day: 1 pack/day for 20.0 years (20.0 ttl pk-yrs)    Types: Cigarettes    Start date: 36    Quit date: 2009    Years since quitting: 15.9   Smokeless tobacco: Never  Vaping Use   Vaping status: Never Used  Substance and Sexual Activity   Alcohol use: No   Drug use: No   Sexual activity: Yes    Birth control/protection: Surgical    Comment: hyst  Other Topics Concern   Not on file  Social History Narrative   Caffeine: max of 1 cup in a day, mostly avoids    Social Drivers of Research scientist (physical sciences) Strain: High Risk (03/05/2022)   Overall Financial Resource Strain (CARDIA)    Difficulty of Paying Living Expenses: Hard  Food Insecurity: Food Insecurity Present (03/05/2022)   Hunger Vital Sign    Worried About Running Out of Food in the Last Year: Sometimes true    Ran Out of Food in the Last Year: Never true  Transportation Needs: No Transportation Needs (03/05/2022)   PRAPARE - Administrator, Civil Service (Medical): No    Lack of Transportation (Non-Medical): No  Physical Activity:  Inactive (03/05/2022)   Exercise Vital Sign    Days of Exercise per Week: 0 days    Minutes of Exercise per Session: 0 min  Stress: Stress Concern Present (03/05/2022)   [employer] of Occupational Health - Occupational Stress Questionnaire    Feeling of Stress : Rather much  Social Connections: Moderately Integrated (03/05/2022)   Social Connection and Isolation Panel [NHANES]    Frequency of Communication with Friends and Family: More than three times a week    Frequency of Social Gatherings with Friends and Family: Twice a week    Attends Religious Services: More than 4 times per year    Active Member of [employer] or Organizations: No    Attends Banker Meetings: Never    Marital Status: Married  Catering manager Violence: Not At Risk (03/05/2022)   Humiliation, Afraid, Rape, and Kick questionnaire    Fear of Current or Ex-Partner: No    Emotionally Abused: No    Physically Abused: No    Sexually Abused: No    Review of Systems: General: Negative for fever, chills, fatigue, weakness. Eyes: Negative for vision changes.  ENT: Negative for hoarseness, difficulty swallowing , nasal congestion. CV: Negative for chest pain, angina, palpitations, dyspnea on exertion, peripheral edema.  Respiratory: Negative for dyspnea at rest, dyspnea on exertion, cough, sputum, wheezing.  GI: See history of present illness. GU:  Negative for dysuria, hematuria, urinary  incontinence, urinary frequency, nocturnal urination.  MS: Negative for joint pain, low back pain.  Derm: Negative for rash or itching.  Neuro: Negative for weakness, abnormal sensation, seizure, frequent headaches, memory loss, confusion.  Psych: Negative for anxiety, depression Endo: Negative for unusual weight change.  Heme: Negative for bruising or bleeding. Allergy: Negative for rash or hives.  Physical Exam: Vital signs in last 24 hours: Temp:  [98.7 F (37.1 C)] 98.7 F (37.1 C) (12/20 0815) Pulse Rate:  [76] 76 (12/20 0815) Resp:  [14] 14 (12/20 0815) BP: (126)/(72) 126/72 (12/20 0815) SpO2:  [100 %] 100 % (12/20 0815) Weight:  [86.6 kg] 86.6 kg (12/20 0815)   General:   Alert,  Well-developed, well-nourished, pleasant and cooperative in NAD Head:  Normocephalic and atraumatic. Eyes:  Sclera clear, no icterus.   Conjunctiva pink. Ears:  Normal auditory acuity. Nose:  No deformity, discharge,  or lesions. Msk:  Symmetrical without gross deformities. Normal posture. Extremities:  Without clubbing or edema. Neurologic:  Alert and  oriented x4;  grossly normal neurologically. Skin:  Intact without significant lesions or rashes. Psych:  Alert and cooperative. Normal mood and affect.   Impression/Plan: Kendra Soto is here for an EGD with possible dilation due to history of dysphagia, GERD  Risks, benefits, limitations, imponderables and alternatives regarding procedure have been reviewed with the patient. Questions have been answered. All parties agreeable.

## 2023-03-06 NOTE — Op Note (Signed)
[facility] Patient Name: Kendra Soto Procedure Date: 03/06/2023 9:47 AM MRN: [id number] Date of Birth: 1975-10-24 Attending MD: Hennie Duos. Marletta Lor , [place], [phone] CSN: [id number] Age: 47 Admit Type: Outpatient Procedure:                Upper GI endoscopy Indications:              Dysphagia, Heartburn Providers:                Hennie Duos. Marletta Lor, DO, Buel Ream. Museum/gallery exhibitions officer, Charity fundraiser,                            Judeth Cornfield. Jessee Avers, Technician Referring MD:              Medicines:                See the Anesthesia note for documentation of the                            administered medications Complications:            No immediate complications. Estimated Blood Loss:     Estimated blood loss was minimal. Procedure:                Pre-Anesthesia Assessment:                           - The anesthesia plan was to use monitored                            anesthesia care (MAC).                           After obtaining informed consent, the endoscope was                            passed under direct vision. Throughout the                            procedure, the patient's blood pressure, pulse, and                            oxygen saturations were monitored continuously. The                            GIF-H190 ([id number]) scope was introduced through the                            mouth, and advanced to the second part of duodenum.                            The upper GI endoscopy was accomplished without                            difficulty. The patient tolerated the procedure                            well.  Scope In: 9:51:53 AM Scope Out: 9:56:34 AM Total Procedure Duration: 0 hours 4 minutes 41 seconds  Findings:      The Z-line was regular and was found 36 cm from the incisors.      No endoscopic abnormality was evident in the esophagus to explain the       patient's complaint of dysphagia. Preparations were made for empiric       dilation. A TTS dilator was passed through the  scope. Dilation with an       18-19-20 mm balloon dilator was performed to 20 mm. Dilation was       performed with a mild resistance at 20 mm. Estimated blood loss was none.      Localized moderate inflammation characterized by congestion (edema) and       erosions was found in the gastric antrum. Biopsies were taken with a       cold forceps for Helicobacter pylori testing.      The duodenal bulb, first portion of the duodenum and second portion of       the duodenum were normal. Impression:               - Z-line regular, 36 cm from the incisors.                           - Gastritis. Biopsied.                           - Normal duodenal bulb, first portion of the                            duodenum and second portion of the duodenum. Moderate Sedation:      Per Anesthesia Care Recommendation:           - Patient has a contact number available for                            emergencies. The signs and symptoms of potential                            delayed complications were discussed with the                            patient. Return to normal activities tomorrow.                            Written discharge instructions were provided to the                            patient.                           - Resume previous diet.                           - Continue present medications.                           - Await pathology results.                           -  Repeat upper endoscopy PRN for retreatment.                           - Return to GI clinic in 3 months.                           - Use a proton pump inhibitor PO BID. Procedure Code(s):        --- Professional ---                           [phone], Esophagogastroduodenoscopy, flexible,                            transoral; with biopsy, single or multiple Diagnosis Code(s):        --- Professional ---                           K29.70, Gastritis, unspecified, without bleeding                           R13.10, Dysphagia,  unspecified                           R12, Heartburn CPT copyright 2022 American Medical Association. All rights reserved. The codes documented in this report are preliminary and upon coder review may  be revised to meet current compliance requirements. Hennie Duos. Marletta Lor, DO Hennie Duos. Kendra Stoneberg, DO 03/06/2023 10:00:08 AM This report has been signed electronically. Number of Addenda: 0

## 2023-03-06 NOTE — Anesthesia Postprocedure Evaluation (Signed)
Anesthesia Post Note  Patient: Khiyah Maben  Procedure(s) Performed: ESOPHAGOGASTRODUODENOSCOPY (EGD) WITH PROPOFOL BALLOON DILATION BIOPSY  Patient location during evaluation: Phase II Anesthesia Type: General Level of consciousness: awake Pain management: pain level controlled Vital Signs Assessment: post-procedure vital signs reviewed and stable Respiratory status: spontaneous breathing and respiratory function stable Cardiovascular status: blood pressure returned to baseline and stable Postop Assessment: no headache and no apparent nausea or vomiting Anesthetic complications: no Comments: Late entry   No notable events documented.   Last Vitals:  Vitals:   03/06/23 0815 03/06/23 1002  BP: 126/72   Pulse: 76 77  Resp: 14 16  Temp: 37.1 C 36.8 C  SpO2: 100% 100%    Last Pain:  Vitals:   03/06/23 1002  TempSrc: Axillary  PainSc: 0-No pain                 Windell Norfolk

## 2023-03-10 LAB — SURGICAL PATHOLOGY

## 2023-03-11 ENCOUNTER — Other Ambulatory Visit: Payer: Self-pay | Admitting: Allergy & Immunology

## 2023-03-12 ENCOUNTER — Telehealth: Payer: Self-pay

## 2023-03-12 ENCOUNTER — Other Ambulatory Visit (HOSPITAL_COMMUNITY): Payer: Self-pay

## 2023-03-12 NOTE — Telephone Encounter (Signed)
Pharmacy Patient Advocate Encounter   Received notification from CoverMyMeds that prior authorization for Emgality 120MG /ML auto-injectors (migraine) is required/requested.   Insurance verification completed.   The patient is insured through [facility] .   Per test claim: PA required; PA submitted to above mentioned insurance via CoverMyMeds Key/confirmation #/EOC [id number] Status is pending

## 2023-03-13 NOTE — Telephone Encounter (Signed)
Pharmacy Patient Advocate Encounter  Received notification from [facility] that Prior Authorization for Emgality 120MG /ML auto-injectors (migraine) has been APPROVED from 03/12/2023 to 12/26/20252   PA #/Case ID/Reference #: [id number]

## 2023-03-16 ENCOUNTER — Ambulatory Visit (INDEPENDENT_AMBULATORY_CARE_PROVIDER_SITE_OTHER): Payer: Self-pay

## 2023-03-16 DIAGNOSIS — J309 Allergic rhinitis, unspecified: Secondary | ICD-10-CM | POA: Diagnosis not present

## 2023-03-24 ENCOUNTER — Other Ambulatory Visit (HOSPITAL_COMMUNITY): Payer: Self-pay

## 2023-03-25 ENCOUNTER — Ambulatory Visit (INDEPENDENT_AMBULATORY_CARE_PROVIDER_SITE_OTHER): Payer: 59

## 2023-03-25 DIAGNOSIS — J309 Allergic rhinitis, unspecified: Secondary | ICD-10-CM

## 2023-03-26 ENCOUNTER — Encounter (HOSPITAL_COMMUNITY): Payer: Self-pay | Admitting: Internal Medicine

## 2023-04-01 ENCOUNTER — Ambulatory Visit (INDEPENDENT_AMBULATORY_CARE_PROVIDER_SITE_OTHER): Payer: Self-pay

## 2023-04-01 DIAGNOSIS — J309 Allergic rhinitis, unspecified: Secondary | ICD-10-CM

## 2023-04-08 ENCOUNTER — Ambulatory Visit (INDEPENDENT_AMBULATORY_CARE_PROVIDER_SITE_OTHER): Payer: Self-pay

## 2023-04-08 DIAGNOSIS — J309 Allergic rhinitis, unspecified: Secondary | ICD-10-CM

## 2023-04-15 ENCOUNTER — Ambulatory Visit (INDEPENDENT_AMBULATORY_CARE_PROVIDER_SITE_OTHER): Payer: Self-pay | Admitting: *Deleted

## 2023-04-15 DIAGNOSIS — J309 Allergic rhinitis, unspecified: Secondary | ICD-10-CM | POA: Diagnosis not present

## 2023-04-16 NOTE — Progress Notes (Signed)
VIALS EXP 04-15-24

## 2023-04-18 ENCOUNTER — Other Ambulatory Visit: Payer: Self-pay | Admitting: Internal Medicine

## 2023-04-20 ENCOUNTER — Encounter: Payer: Self-pay | Admitting: Obstetrics & Gynecology

## 2023-04-20 ENCOUNTER — Ambulatory Visit: Payer: 59 | Admitting: Obstetrics & Gynecology

## 2023-04-20 VITALS — BP 108/71 | HR 74 | Ht 65.0 in | Wt 191.4 lb

## 2023-04-20 DIAGNOSIS — N3281 Overactive bladder: Secondary | ICD-10-CM | POA: Diagnosis not present

## 2023-04-20 DIAGNOSIS — N8110 Cystocele, unspecified: Secondary | ICD-10-CM

## 2023-04-20 DIAGNOSIS — J3089 Other allergic rhinitis: Secondary | ICD-10-CM | POA: Diagnosis not present

## 2023-04-20 DIAGNOSIS — Z01411 Encounter for gynecological examination (general) (routine) with abnormal findings: Secondary | ICD-10-CM | POA: Diagnosis not present

## 2023-04-20 MED ORDER — GEMTESA 75 MG PO TABS: 75.0000 mg | ORAL_TABLET | Freq: Every day | ORAL | 11 refills | Status: AC

## 2023-04-20 NOTE — Progress Notes (Signed)
WELL-WOMAN EXAMINATION Patient name: Kendra Soto MRN [id number]  Date of birth: 12-18-1975 Chief Complaint:   Gynecologic Exam  History of Present Illness:   Kendra Soto is a 48 y.o. [id number] PH female being seen today for a routine well-woman exam.   OAB: patient has been doing okay with oxybutynin, she has noted some dry mouth and dry throat.  Her symptoms are intermittent.  She notes that she will have some good days, but also will have some bad days where she will leak-both stress and urge incontinence   Patient's last menstrual period was 01/22/2021.  The current method of family planning is post menopausal status.    Last pap NA.  Last mammogram: 11/2022. Last colonoscopy: 2023     03/05/2022    2:34 PM 05/08/2020    2:41 PM  Depression screen PHQ 2/9  Decreased Interest 2 1  Down, Depressed, Hopeless 1 0  PHQ - 2 Score 3 1  Altered sleeping 3 2  Tired, decreased energy 3 2  Change in appetite 1 0  Feeling bad or failure about yourself  1 0  Trouble concentrating 2 2  Moving slowly or fidgety/restless 1 0  Suicidal thoughts 0 0  PHQ-9 Score 14 7      Review of Systems:   Pertinent items are noted in HPI Denies any headaches, blurred vision, fatigue, shortness of breath, chest pain, abdominal pain, bowel movements, urination, or intercourse unless otherwise stated above.  Pertinent History Reviewed:  Reviewed past medical,surgical, social and family history.  Reviewed problem list, medications and allergies. Physical Assessment:   Vitals:   04/20/23 1408  BP: 108/71  Pulse: 74  Weight: 191 lb 6.4 oz (86.8 kg)  Height: 5\' 5"  (1.651 m)  Body mass index is 31.85 kg/m.        Physical Examination:   General appearance - well appearing, and in no distress  Mental status - alert, oriented to person, place, and time  Psych:  She has a normal mood and affect  Skin - warm and dry, normal color, no suspicious lesions noted  Chest - effort normal, all lung  fields clear to auscultation bilaterally  Heart - normal rate and regular rhythm  Neck:  midline trachea, no thyromegaly or nodules  Breasts - breasts appear normal, no suspicious masses, no skin or nipple changes or  axillary nodes  Abdomen - soft, nontender, nondistended, no masses or organomegaly  Pelvic - VULVA: normal appearing vulva with no masses, tenderness or lesions  VAGINA: normal appearing vagina with normal color and discharge, no lesions.  Stage I prolapse noted with urethral hypermobility.  Good apical support appreciated ADNEXA: No adnexal masses or tenderness noted.  Extremities:  No swelling or varicosities noted  Chaperone:  Dr. Claudean Severance      Assessment & Plan:  1) Well-Woman Exam -pap not indicated -mammogram up todate 11/2022  2) OAB -notes dry throat/mouth with current medication -will plan to switch to gemtesa though concern regarding coverage []  pt to keep [place] posted regarding cost of medication -Referral for pelvic floor therapy  Orders Placed This Encounter  Procedures   Ambulatory referral to Physical Therapy    Meds:  Meds ordered this encounter  Medications   Vibegron (GEMTESA) 75 MG TABS    Sig: Take 1 tablet (75 mg total) by mouth daily.    Dispense:  30 tablet    Refill:  11    Follow-up: Return in about 1 year (around 04/19/2024) for Annual.  Myna Hidalgo, DO Attending Obstetrician & Gynecologist, [facility] for [employer], [facility]

## 2023-04-22 ENCOUNTER — Encounter: Payer: Self-pay | Admitting: Obstetrics & Gynecology

## 2023-04-22 ENCOUNTER — Ambulatory Visit (INDEPENDENT_AMBULATORY_CARE_PROVIDER_SITE_OTHER): Payer: 59

## 2023-04-22 ENCOUNTER — Ambulatory Visit: Payer: 59 | Admitting: Adult Health

## 2023-04-22 DIAGNOSIS — J309 Allergic rhinitis, unspecified: Secondary | ICD-10-CM | POA: Diagnosis not present

## 2023-04-23 NOTE — Telephone Encounter (Signed)
 Pt call back to check if POD 4 was available to take her phone call. Pt would like a call back.

## 2023-04-23 NOTE — Telephone Encounter (Signed)
 Pt called on her lunch break to speak with a nurse about a work in.

## 2023-04-27 ENCOUNTER — Telehealth: Payer: Self-pay

## 2023-04-27 NOTE — Telephone Encounter (Signed)
 I called patient to informed her that she is currently on the waiting list but if she having acute migraines then she has the option for a virtual visit with [facility]. Pt verbalized understanding. Pt had no questions at this time but was encouraged to call back if questions arise.

## 2023-04-29 ENCOUNTER — Ambulatory Visit (INDEPENDENT_AMBULATORY_CARE_PROVIDER_SITE_OTHER): Payer: Self-pay

## 2023-04-29 DIAGNOSIS — J309 Allergic rhinitis, unspecified: Secondary | ICD-10-CM | POA: Diagnosis not present

## 2023-05-04 ENCOUNTER — Encounter: Payer: Self-pay | Admitting: Internal Medicine

## 2023-05-04 ENCOUNTER — Ambulatory Visit: Payer: 59 | Admitting: Internal Medicine

## 2023-05-04 ENCOUNTER — Other Ambulatory Visit: Payer: Self-pay

## 2023-05-04 VITALS — BP 106/70 | HR 90 | Temp 97.8°F | Resp 12 | Wt 192.4 lb

## 2023-05-04 DIAGNOSIS — J302 Other seasonal allergic rhinitis: Secondary | ICD-10-CM

## 2023-05-04 DIAGNOSIS — J4540 Moderate persistent asthma, uncomplicated: Secondary | ICD-10-CM

## 2023-05-04 DIAGNOSIS — J3089 Other allergic rhinitis: Secondary | ICD-10-CM | POA: Diagnosis not present

## 2023-05-04 MED ORDER — SPIRIVA RESPIMAT 1.25 MCG/ACT IN AERS: 2.0000 | INHALATION_SPRAY | Freq: Every day | RESPIRATORY_TRACT | 5 refills | Status: DC

## 2023-05-04 MED ORDER — FLUTICASONE-SALMETEROL 500-50 MCG/ACT IN AEPB: 1.0000 | INHALATION_SPRAY | Freq: Two times a day (BID) | RESPIRATORY_TRACT | 5 refills | Status: DC

## 2023-05-04 MED ORDER — LEVOCETIRIZINE DIHYDROCHLORIDE 5 MG PO TABS: 5.0000 mg | ORAL_TABLET | Freq: Every day | ORAL | 1 refills | Status: DC | PRN

## 2023-05-04 MED ORDER — ALBUTEROL SULFATE HFA 108 (90 BASE) MCG/ACT IN AERS: 1.0000 | INHALATION_SPRAY | Freq: Four times a day (QID) | RESPIRATORY_TRACT | 1 refills | Status: DC | PRN

## 2023-05-04 MED ORDER — AZELASTINE HCL 0.1 % NA SOLN: 1.0000 | Freq: Two times a day (BID) | NASAL | 5 refills | Status: DC | PRN

## 2023-05-04 MED ORDER — MONTELUKAST SODIUM 10 MG PO TABS: 10.0000 mg | ORAL_TABLET | Freq: Every day | ORAL | 1 refills | Status: DC

## 2023-05-04 MED ORDER — IPRATROPIUM BROMIDE 0.03 % NA SOLN: 1.0000 | Freq: Three times a day (TID) | NASAL | 5 refills | Status: DC | PRN

## 2023-05-04 NOTE — Patient Instructions (Addendum)
Moderate persistent asthma - Daily controller medication(s): Wixela 500/34mcg 1 puff once daily and Spiriva 1.33mcg 2 puffs once daily. If symptoms worsen or with illness, increase Advair to 1 puff twice daily.  - Rescue medications: Albuterol 2 puffs every 4-6 hours as needed for cough, wheeze, shortness of breath. - Asthma control goals:  * Full participation in all desired activities (may need albuterol before activity) * Albuterol use two time or less a week on average (not counting use with activity) * Cough interfering with sleep two time or less a month * Oral steroids no more than once a year * No hospitalizations  Allergic rhinitis (grasses, trees, indoor molds, outdoor molds, dust mites, and cat) - Use nasal saline rinses before nose sprays such as with Neilmed Sinus Rinse.  Use distilled water.  . - Use Azelastine 1-2 sprays each nostril twice daily as needed for runny nose, drainage, sneezing, congestion. Aim upward and outward. - Use Ipratroprium 1-2 sprays up to four times daily as needed for runny nose. Aim upward and outward. Be careful as this can be drying.  - Use Xyzal 5mg  daily as needed for runny nose, sneezing, itchy watery eyes.  On shot days, take Xyzal 5mg  daily.   - Use Singulair 10mg  daily. Stop if there are any mood/behavioral changes. - For eyes, use Olopatadine or Ketotifen 1 eye drop daily as needed for itchy, watery eyes.  Available over the counter, if not covered by insurance.  - Continue allergy shots on schedule.  AIT initiated 09/2022, reached red vial 12/2022. Keep Epipen.   GERD -Continue follow up with GI.

## 2023-05-04 NOTE — Progress Notes (Signed)
FOLLOW UP Date of Service/Encounter:  05/04/23   Subjective:  Kendra Soto (DOB: 1975-04-03) is a 48 y.o. female who [facility] for follow up for allergic rhinitis and moderate persistent asthma.  Also with GERD, followed by GI.    History obtained from: chart review and patient. Last visit was with me on 01/26/2023 and at the time was having a lot of cough after illness.  Controlled asthma on Wixela and Spiriva.  For allergies, on Ipratroprium/Azelastine PRN and Singulair + Xyzal; also doing AIT.   Since last visit, reports asthma has done well.  Not much SOB/cough.  Using Wixela 1 puff daily and Spiriva 2 puffs daily; requests sending this to [facility] and other Rx to [facility].  No ER visits/oral prednisone use since last visit.   Allergies are up and down.  Still notes having frequent congestion, drainage, runny nose.  Was on oxybutynin but switched and that was helping dry up her sinuses.  Now using Ipratropium BID, rarely Azelastine, Xyzal daily (BID on shot days) and Singulair daily.  Does sometimes have local reactions with redness/itching but Xyzal BID dosing helps, has an Epipen.    Past Medical History: Past Medical History:  Diagnosis Date   Allergies    Arthritis    Asthma    Dysrhythmia    GERD (gastroesophageal reflux disease)    History of kidney stones    Hx of migraine headaches    Hypothyroid    Overactive bladder    Palpitations    Pre-diabetes     Objective:  BP 106/70   Pulse 90   Temp 97.8 F (36.6 C)   Resp 12   Wt 192 lb 6 oz (87.3 kg)   LMP 01/22/2021 Comment: Pregnancy test negative on 01/18/2021  SpO2 95%   BMI 32.01 kg/m  Body mass index is 32.01 kg/m. Physical Exam: GEN: alert, well developed HEENT: clear conjunctiva, nose with mild inferior turbinate hypertrophy, pink nasal mucosa, + clear rhinorrhea, + cobblestoning HEART: regular rate and rhythm, no murmur LUNGS: clear to auscultation bilaterally,  no coughing, unlabored respiration SKIN: no rashes or lesions  Spirometry:  Tracings reviewed. Her effort: Good reproducible efforts. FVC: 3.73L, 107% predicted  FEV1: 3.12L, 111% predicted FEV1/FVC ratio: 84% Interpretation: Spirometry consistent with normal pattern.  Please see scanned spirometry results for details.  Assessment:   1. Seasonal and perennial allergic rhinitis   2. Moderate persistent asthma, uncomplicated     Plan/Recommendations:  Moderate persistent asthma - Well controlled with normal spirometry today.  MDI technique discussed.  - Daily controller medication(s): Wixela 500/10mcg 1 puff once daily and Spiriva 1.92mcg 2 puffs once daily. If symptoms worsen or with illness, increase Advair to 1 puff twice daily.  - Rescue medications: Albuterol 2 puffs every 4-6 hours as needed for cough, wheeze, shortness of breath. - Asthma control goals:  * Full participation in all desired activities (may need albuterol before activity) * Albuterol use two time or less a week on average (not counting use with activity) * Cough interfering with sleep two time or less a month * Oral steroids no more than once a year * No hospitalizations  Allergic rhinitis (grasses, trees, indoor molds, outdoor molds, dust mites, and cat) - Uncontrolled, continue AIT.  - Use nasal saline rinses before nose sprays such as with Neilmed Sinus Rinse.  Use distilled water.  . - Use Azelastine 1-2 sprays each nostril twice daily as needed for runny  nose, drainage, sneezing, congestion. Aim upward and outward. - Use Ipratroprium 1-2 sprays up to four times daily as needed for runny nose. Aim upward and outward. Be careful as this can be drying.  - Use Xyzal 5mg  daily as needed for runny nose, sneezing, itchy watery eyes.  On shot days, take Xyzal 5mg  daily.   - Use Singulair 10mg  daily. Stop if there are any mood/behavioral changes. - For eyes, use Olopatadine or Ketotifen 1 eye drop daily as needed  for itchy, watery eyes.  Available over the counter, if not covered by insurance.  - Continue allergy shots on schedule.  AIT initiated 09/2022, reached red vial 12/2022. Keep Epipen.   GERD -Continue follow up with GI.     Return in about 6 months (around 11/01/2023).  Alesia Morin, MD [facility]

## 2023-05-12 ENCOUNTER — Encounter: Payer: Self-pay | Admitting: Internal Medicine

## 2023-05-12 NOTE — Therapy (Signed)
 OUTPATIENT PHYSICAL THERAPY FEMALE PELVIC EVALUATION   Patient Name: Kendra Soto MRN: [id number] DOB:Jul 29, 1975, 48 y.o., female Today's Date: 05/14/2023  END OF SESSION:  PT End of Session - 05/14/23 1432     Visit Number 1    Authorization Type [employer] [id number]  no auth req    PT Start Time 1230    PT Stop Time 1315    PT Time Calculation (min) 45 min    Activity Tolerance Patient tolerated treatment well    Behavior During Therapy WFL for tasks assessed/performed             Past Medical History:  Diagnosis Date   Allergies    Arthritis    Asthma    Dysrhythmia    GERD (gastroesophageal reflux disease)    History of kidney stones    Hx of migraine headaches    Hypothyroid    Overactive bladder    Palpitations    Pre-diabetes    Past Surgical History:  Procedure Laterality Date   BALLOON DILATION N/A 03/06/2023   Procedure: BALLOON DILATION;  Surgeon: Lanelle Bal, DO;  [facility];  Service: Endoscopy;  Laterality: N/A;   BIOPSY  03/06/2023   Procedure: BIOPSY;  Surgeon: Lanelle Bal, DO;  [facility];  Service: Endoscopy;;   CHOLECYSTECTOMY  2016   COLONOSCOPY WITH PROPOFOL N/A 12/03/2021   Procedure: COLONOSCOPY WITH PROPOFOL;  Surgeon: Lanelle Bal, DO;  [facility];  Service: Endoscopy;  Laterality: N/A;  10:30am, asa 2   ESOPHAGOGASTRODUODENOSCOPY N/A 04/30/2018   normal. Empiric dilatation   ESOPHAGOGASTRODUODENOSCOPY (EGD) WITH PROPOFOL N/A 03/06/2023   Procedure: ESOPHAGOGASTRODUODENOSCOPY (EGD) WITH PROPOFOL;  Surgeon: Lanelle Bal, DO;  [facility];  Service: Endoscopy;  Laterality: N/A;  900am, asa 2   MALONEY DILATION N/A 04/30/2018   Procedure: MALONEY DILATION;  Surgeon: Corbin Ade, MD;  [facility];  Service: Endoscopy;  Laterality: N/A;   POLYPECTOMY  12/03/2021   Procedure: POLYPECTOMY;  Surgeon: Lanelle Bal, DO;  [facility];  Service:  Endoscopy;;   TUBAL LIGATION     VAGINAL HYSTERECTOMY N/A 01/22/2021   Procedure: HYSTERECTOMY VAGINAL;  Surgeon: Myna Hidalgo, DO;  Location: [facility];  Service: Gynecology;  Laterality: N/A;   WISDOM TOOTH EXTRACTION     Patient Active Problem List   Diagnosis Date Noted   Seasonal and perennial allergic rhinitis 02/14/2022   Not well controlled moderate persistent asthma 02/14/2022   Rheumatoid arthritis (HCC) 04/12/2020   Chronic RUQ pain 04/16/2018   Abdominal pain, chronic, epigastric 04/16/2018   GERD (gastroesophageal reflux disease) 04/16/2018   Esophageal dysphagia 04/16/2018    PCP: Donetta Potts, MD  REFERRING PROVIDER: Myna Hidalgo, DO  REFERRING DIAG: N32.81 (ICD-10-CM) - Overactive bladder N81.10 (ICD-10-CM) - POP-Q stage 1 cystocele  THERAPY DIAG:  Other lack of coordination  Muscle weakness (generalized)  Rationale for Evaluation and Treatment: Rehabilitation  ONSET DATE: 40 years, has gotten worse  SUBJECTIVE:  SUBJECTIVE STATEMENT: Pt reports that she remembers having strong urgency as a kid, it has gotten worse over the years. Now she is leaking - last 5 years. Several years ago started taking medicine, medicine has worked some. Urgency is better. Some leakage once in a while. Pt wondering if she should have pads.  Lives 35 mins away Seeing GI doctors Wants to maybe do eval only  Fluid intake: different every day- mostly water, about 20-40 oz    PAIN:  Are you having pain? Yes NPRS scale: 2-3/10 Pain location: Internal  Pain type: dull Pain description: intermittent depends on if bladder is irritated  Aggravating factors: holding pee too long Relieving factors: urinating  PRECAUTIONS: None  RED FLAGS: None   WEIGHT BEARING RESTRICTIONS: No  FALLS:   Has patient fallen in last 6 months? No  OCCUPATION: [employer]  ACTIVITY LEVEL : active  PLOF: Independent  PATIENT GOALS: not leak   PERTINENT HISTORY:  Hysterectomy 2022 Sexual abuse: No  BOWEL MOVEMENT: Pain with bowel movement: Yes Type of bowel movement:Type (Bristol Stool Scale) 1-7, used to poop every day, now not every day sometimes can go a few days Fully empty rectum: No not always Leakage: No Pads: No Fiber supplement/laxative No  URINATION: Pain with urination: No Fully empty bladder: Yes:    on new medicine Stream: Strong on new medicine Urgency: Yes  Frequency: 3-4 time at the most on a good day. At times 2x/day, sometimes 5-6 times Leakage: Urge to void Pads: No  INTERCOURSE:  Ability to have vaginal penetration Yes  Pain with intercourse: During Penetration DrynessYes  Climax: yes Marinoff Scale: 1/3 Laxative:  PREGNANCY: Vaginal deliveries 3 Tearing Yes:   Episiotomy No C-section deliveries 0 Currently pregnant No  PROLAPSE: Pressure and Bulge   OBJECTIVE:  Note: Objective measures were completed at Evaluation unless otherwise noted.  COGNITION: Overall cognitive status: Within functional limits for tasks assessed     SENSATION: Light touch: Appears intact   POSTURE: rounded shoulders and forward head   LUMBARAROM/PROM:  A/PROM A/PROM  eval  Flexion   Extension   Right lateral flexion   Left lateral flexion   Right rotation   Left rotation    (Blank rows = not tested)  LOWER EXTREMITY ROM: grossly within functional limitations    LOWER EXTREMITY MMT: grossly at least 4/5 overall  PALPATION:   General: upper chest breathing, accessory breathing, abdominal gripping and  tender and overactive bilateral UT  Pelvic Alignment: seems even  Abdominal: gripping                External Perineal Exam:                              Internal Pelvic Floor:   Patient confirms identification and approves PT to assess  internal pelvic floor and treatment Yes  PELVIC MMT:   MMT eval  Vaginal   Internal Anal Sphincter   External Anal Sphincter   Puborectalis   Diastasis Recti   (Blank rows = not tested)        TONE:   PROLAPSE:   TODAY'S TREATMENT:  DATE: 05/14/23   EVAL see below    PATIENT EDUCATION:  Education details: urge suppression techniques, bladder diary,  Person educated: Patient Education method: Explanation, Demonstration, Tactile cues, Verbal cues, and Handouts Education comprehension: verbalized understanding, returned demonstration, verbal cues required, tactile cues required, and needs further education  HOME EXERCISE PROGRAM: [id number]  ASSESSMENT:  CLINICAL IMPRESSION: Patient is a 48 y.o. F who was seen today for physical therapy evaluation and treatment for urge incontinence, she mentioned constipation and dyspareunia as well. Was prescribed vaginal estrogen but not taking it. She has chronic urgency for about 40 years since childhood and worsening incontinence. Able to hold pee for 4 hours at work. Does not seem to hydrate enough. She will benefit from PT to learn healthy bladder habits, reduce urinary urgency, constipation and pain with intercourse.   OBJECTIVE IMPAIRMENTS: decreased coordination, decreased strength, and pain.   ACTIVITY LIMITATIONS: continence and toileting  PARTICIPATION LIMITATIONS: occupation  PERSONAL FACTORS: Time since onset of injury/illness/exacerbation are also affecting patient's functional outcome.   REHAB POTENTIAL: Good  CLINICAL DECISION MAKING: Evolving/moderate complexity  EVALUATION COMPLEXITY: Moderate   GOALS: Goals reviewed with patient? Yes  SHORT TERM GOALS: Target date: 06/11/2023    Pt will fill out bladder diary Baseline: Goal status: INITIAL  2.  Pt will increase her water  intake Baseline:  Goal status: INITIAL  3.  Pt will be I with urge drill Baseline:  Goal status: INITIAL  4.  Pt will be I with her initial HEP Baseline:  Goal status: INITIAL   LONG TERM GOALS: Target date: 6 months- 11/04/2023  Pt will be I with advanced HEP Baseline:  Goal status: INITIAL  2.  Pt will soak 0 pads/ day Baseline:  Goal status: INITIAL  3.  Pt will dem good bladder habits Baseline:  Goal status: INITIAL  4.  Pt will report 0 pain with IC Baseline:  Goal status: INITIAL  5.  Pt will report regular BMs at least every other day without straining Baseline:  Goal status: INITIAL   PLAN:  PT FREQUENCY: 1x/week  PT DURATION: 6 months  PLANNED INTERVENTIONS: 97110-Therapeutic exercises, 97530- Therapeutic activity, 97112- Neuromuscular re-education, 97535- Self Care, 44315- Manual therapy, Dry Needling, Joint mobilization, Joint manipulation, Spinal manipulation, Spinal mobilization, Scar mobilization, and Biofeedback  PLAN FOR NEXT SESSION: check hysterectomy scar, teach abdominal massage and constipation strategies  Lassie Demorest, PT 05/14/23 4:03 PM

## 2023-05-13 ENCOUNTER — Ambulatory Visit (INDEPENDENT_AMBULATORY_CARE_PROVIDER_SITE_OTHER): Payer: Self-pay

## 2023-05-13 DIAGNOSIS — J309 Allergic rhinitis, unspecified: Secondary | ICD-10-CM | POA: Diagnosis not present

## 2023-05-13 MED ORDER — HYDROCORTISONE 2.5 % EX CREA: TOPICAL_CREAM | CUTANEOUS | 0 refills | Status: DC | PRN

## 2023-05-14 ENCOUNTER — Other Ambulatory Visit: Payer: Self-pay

## 2023-05-14 ENCOUNTER — Ambulatory Visit: Payer: 59 | Attending: Obstetrics & Gynecology | Admitting: Physical Therapy

## 2023-05-14 ENCOUNTER — Encounter: Payer: Self-pay | Admitting: Physical Therapy

## 2023-05-14 DIAGNOSIS — N3281 Overactive bladder: Secondary | ICD-10-CM | POA: Insufficient documentation

## 2023-05-14 DIAGNOSIS — R278 Other lack of coordination: Secondary | ICD-10-CM | POA: Diagnosis present

## 2023-05-14 DIAGNOSIS — M6281 Muscle weakness (generalized): Secondary | ICD-10-CM | POA: Diagnosis present

## 2023-05-14 DIAGNOSIS — N8110 Cystocele, unspecified: Secondary | ICD-10-CM | POA: Insufficient documentation

## 2023-05-20 ENCOUNTER — Ambulatory Visit (INDEPENDENT_AMBULATORY_CARE_PROVIDER_SITE_OTHER): Payer: Self-pay

## 2023-05-20 DIAGNOSIS — J309 Allergic rhinitis, unspecified: Secondary | ICD-10-CM

## 2023-05-27 ENCOUNTER — Ambulatory Visit (INDEPENDENT_AMBULATORY_CARE_PROVIDER_SITE_OTHER): Payer: Self-pay

## 2023-05-27 DIAGNOSIS — J309 Allergic rhinitis, unspecified: Secondary | ICD-10-CM

## 2023-06-02 ENCOUNTER — Ambulatory Visit: Payer: 59 | Attending: Obstetrics & Gynecology | Admitting: Physical Therapy

## 2023-06-02 ENCOUNTER — Encounter: Payer: Self-pay | Admitting: Physical Therapy

## 2023-06-02 DIAGNOSIS — M542 Cervicalgia: Secondary | ICD-10-CM | POA: Diagnosis present

## 2023-06-02 DIAGNOSIS — M6281 Muscle weakness (generalized): Secondary | ICD-10-CM | POA: Insufficient documentation

## 2023-06-02 DIAGNOSIS — R278 Other lack of coordination: Secondary | ICD-10-CM | POA: Diagnosis present

## 2023-06-02 NOTE — Therapy (Addendum)
 OUTPATIENT PHYSICAL THERAPY FEMALE PELVIC TREATMENT   Patient Name: Kendra Soto MRN: [id number] DOB:April 20, 1975, 48 y.o., female Today's Date: 06/02/2023  END OF SESSION:  PT End of Session - 06/02/23 1245     Visit Number 2    Authorization Type [employer]na [employer] 2025  no auth req    PT Start Time 1145    PT Stop Time 1240    PT Time Calculation (min) 55 min    Activity Tolerance Patient tolerated treatment well    Behavior During Therapy WFL for tasks assessed/performed              Past Medical History:  Diagnosis Date   Allergies    Arthritis    Asthma    Dysrhythmia    GERD (gastroesophageal reflux disease)    History of kidney stones    Hx of migraine headaches    Hypothyroid    Overactive bladder    Palpitations    Pre-diabetes    Past Surgical History:  Procedure Laterality Date   BALLOON DILATION N/A 03/06/2023   Procedure: BALLOON DILATION;  Surgeon: Lanelle Bal, DO;  [facility];  Service: Endoscopy;  Laterality: N/A;   BIOPSY  03/06/2023   Procedure: BIOPSY;  Surgeon: Lanelle Bal, DO;  [facility];  Service: Endoscopy;;   CHOLECYSTECTOMY  2016   COLONOSCOPY WITH PROPOFOL N/A 12/03/2021   Procedure: COLONOSCOPY WITH PROPOFOL;  Surgeon: Lanelle Bal, DO;  [facility];  Service: Endoscopy;  Laterality: N/A;  10:30am, asa 2   ESOPHAGOGASTRODUODENOSCOPY N/A 04/30/2018   normal. Empiric dilatation   ESOPHAGOGASTRODUODENOSCOPY (EGD) WITH PROPOFOL N/A 03/06/2023   Procedure: ESOPHAGOGASTRODUODENOSCOPY (EGD) WITH PROPOFOL;  Surgeon: Lanelle Bal, DO;  [facility];  Service: Endoscopy;  Laterality: N/A;  900am, asa 2   MALONEY DILATION N/A 04/30/2018   Procedure: MALONEY DILATION;  Surgeon: Corbin Ade, MD;  [facility];  Service: Endoscopy;  Laterality: N/A;   POLYPECTOMY  12/03/2021   Procedure: POLYPECTOMY;  Surgeon: Lanelle Bal, DO;  [facility];   Service: Endoscopy;;   TUBAL LIGATION     VAGINAL HYSTERECTOMY N/A 01/22/2021   Procedure: HYSTERECTOMY VAGINAL;  Surgeon: Myna Hidalgo, DO;  Location: [facility];  Service: Gynecology;  Laterality: N/A;   WISDOM TOOTH EXTRACTION     Patient Active Problem List   Diagnosis Date Noted   Seasonal and perennial allergic rhinitis 02/14/2022   Not well controlled moderate persistent asthma 02/14/2022   Rheumatoid arthritis (HCC) 04/12/2020   Chronic RUQ pain 04/16/2018   Abdominal pain, chronic, epigastric 04/16/2018   GERD (gastroesophageal reflux disease) 04/16/2018   Esophageal dysphagia 04/16/2018    PCP: Donetta Potts, MD  REFERRING PROVIDER: Myna Hidalgo, DO  REFERRING DIAG: N32.81 (ICD-10-CM) - Overactive bladder N81.10 (ICD-10-CM) - POP-Q stage 1 cystocele  THERAPY DIAG:  Other lack of coordination  Muscle weakness (generalized)  Cervicalgia  Rationale for Evaluation and Treatment: Rehabilitation  ONSET DATE: 40 years, has gotten worse  SUBJECTIVE:  SUBJECTIVE STATEMENT: Pt reports that she is not drinking enough Trying to hold a little bit longer Has never thought of ic  Sees and OB      Pt reports that she remembers having strong urgency as a kid, it has gotten worse over the years. Now she is leaking - last 5 years. Several years ago started taking medicine, medicine has worked some. Urgency is better. Some leakage once in a while. Pt wondering if she should have pads.  Lives 35 mins away Seeing GI doctors Wants to maybe do eval only  Fluid intake: different every day- mostly water, about 20-40 oz    PAIN:  Are you having pain? Yes NPRS scale: 2-3/10 Pain location: Internal  Pain type: dull Pain description: intermittent depends on if bladder is  irritated  Aggravating factors: holding pee too long Relieving factors: urinating  PRECAUTIONS: None  RED FLAGS: None   WEIGHT BEARING RESTRICTIONS: No  FALLS:  Has patient fallen in last 6 months? No  OCCUPATION: [employer]  ACTIVITY LEVEL : active  PLOF: Independent  PATIENT GOALS: not leak   PERTINENT HISTORY:  Hysterectomy 2022 Sexual abuse: No  BOWEL MOVEMENT: Pain with bowel movement: Yes Type of bowel movement:Type (Bristol Stool Scale) 1-7, used to poop every day, now not every day sometimes can go a few days Fully empty rectum: No not always Leakage: No Pads: No Fiber supplement/laxative No  URINATION: Pain with urination: No Fully empty bladder: Yes:    on new medicine Stream: Strong on new medicine Urgency: Yes  Frequency: 3-4 time at the most on a good day. At times 2x/day, sometimes 5-6 times Leakage: Urge to void Pads: No  INTERCOURSE:  Ability to have vaginal penetration Yes  Pain with intercourse: During Penetration with some positions DrynessYes  Climax: yes Marinoff Scale: 1/3 Laxative:  PREGNANCY: Vaginal deliveries 3 Tearing Yes:   Episiotomy No C-section deliveries 0 Currently pregnant No  PROLAPSE: Pressure and Bulge   OBJECTIVE:  Note: Objective measures were completed at Evaluation unless otherwise noted.  COGNITION: Overall cognitive status: Within functional limits for tasks assessed     SENSATION: Light touch: Appears intact   POSTURE: rounded shoulders and forward head   LUMBARAROM/PROM: grossly WFL  LOWER EXTREMITY ROM: grossly within functional limitations    LOWER EXTREMITY MMT: grossly at least 4/5 overall  PALPATION:   General: upper chest breathing, accessory breathing, abdominal gripping and  tender and overactive bilateral UT  Pelvic Alignment: seems even  Abdominal: gripping                External Perineal Exam: seems within functional limitations                              Internal  Pelvic Floor: tight and tender left bulbo  Patient confirms identification and approves PT to assess internal pelvic floor and treatment Yes  PELVIC MMT:   MMT eval  Vaginal 2/5   Internal Anal Sphincter   External Anal Sphincter   Puborectalis   Diastasis Recti   (Blank rows = not tested)        TONE: anterior and posterior wall laxity present, tailbone palpated   PROLAPSE:  wall laxity present   TODAY'S TREATMENT:  DATE: 06/02/23   There act- urge drill, bladder diary assessment                   Bladder irritants education  Neuro reed- transverse abdominis breath with PF contraction- ball press, hip add with ball, diaphragmatic breathing reed with theraband  Manual- Internal exam, pelvic floor left bulbo release, manual feedback for contraction and relaxation  PATIENT EDUCATION:  Education details: urge suppression techniques, bladder diary,  Person educated: Patient Education method: Explanation, Demonstration, Tactile cues, Verbal cues, and Handouts Education comprehension: verbalized understanding, returned demonstration, verbal cues required, tactile cues required, and needs further education  HOME EXERCISE PROGRAM: [id number]  ASSESSMENT:  CLINICAL IMPRESSION: Pt to wear some pads during bladder retraining, reduce JIC peeing.  Has pelvic floor weakness throughout with decent coordination, left bulbo tightness. Low abdominal tone and tends to dem upper chest breathing. Anterior and posterior vaginal wall laxity present with palpable tailbone ( pt reported that she had a fall on slippery stairs several years ago)     Patient is a 48 y.o. F who was seen today for physical therapy evaluation and treatment for urge incontinence, she mentioned constipation and dyspareunia as well. Was prescribed vaginal estrogen but not taking it. She has chronic  urgency for about 40 years since childhood and worsening incontinence. Able to hold pee for 4 hours at work. Does not seem to hydrate enough. She will benefit from PT to learn healthy bladder habits, reduce urinary urgency, constipation and pain with intercourse.   OBJECTIVE IMPAIRMENTS: decreased coordination, decreased strength, and pain.   ACTIVITY LIMITATIONS: continence and toileting  PARTICIPATION LIMITATIONS: occupation  PERSONAL FACTORS: Time since onset of injury/illness/exacerbation are also affecting patient's functional outcome.   REHAB POTENTIAL: Good  CLINICAL DECISION MAKING: Evolving/moderate complexity  EVALUATION COMPLEXITY: Moderate   GOALS: Goals reviewed with patient? Yes  SHORT TERM GOALS: Target date: 06/11/2023    Pt will fill out bladder diary Baseline: Goal status: met  2.  Pt will increase her water intake Baseline:  Goal status: progressing  3.  Pt will be I with urge drill Baseline:  Goal status: progressing  4.  Pt will be I with her initial HEP Baseline:  Goal status: INITIAL   LONG TERM GOALS: Target date: 6 months- 11/04/2023  Pt will be I with advanced HEP Baseline:  Goal status: INITIAL  2.  Pt will soak 0 pads/ day Baseline:  Goal status: INITIAL  3.  Pt will dem good bladder habits Baseline:  Goal status: INITIAL  4.  Pt will report 0 pain with IC Baseline:  Goal status: INITIAL  5.  Pt will report regular BMs at least every other day without straining Baseline:  Goal status: INITIAL              6. Pt will improve her pelvic floor strength to at least 4/5 MMT  PLAN:  PT FREQUENCY: 1x/week  PT DURATION: 6 months  PLANNED INTERVENTIONS: 97110-Therapeutic exercises, 97530- Therapeutic activity, 97112- Neuromuscular re-education, 97535- Self Care, 56213- Manual therapy, Dry Needling, Joint mobilization, Joint manipulation, Spinal manipulation, Spinal mobilization, Scar mobilization, and Biofeedback  PLAN FOR NEXT  SESSION: cont pelvic floor strengthening and coordination for bladder p. , trial of upper trap dry needling?  Ruey Storer, PT 06/02/23 1:57 PM

## 2023-06-09 ENCOUNTER — Encounter: Payer: Self-pay | Admitting: Internal Medicine

## 2023-06-12 ENCOUNTER — Ambulatory Visit (INDEPENDENT_AMBULATORY_CARE_PROVIDER_SITE_OTHER): Payer: Self-pay

## 2023-06-12 DIAGNOSIS — J309 Allergic rhinitis, unspecified: Secondary | ICD-10-CM | POA: Diagnosis not present

## 2023-06-15 ENCOUNTER — Ambulatory Visit: Payer: 59 | Admitting: Physical Therapy

## 2023-06-15 ENCOUNTER — Encounter: Payer: Self-pay | Admitting: Physical Therapy

## 2023-06-15 DIAGNOSIS — R278 Other lack of coordination: Secondary | ICD-10-CM | POA: Diagnosis not present

## 2023-06-15 DIAGNOSIS — M6281 Muscle weakness (generalized): Secondary | ICD-10-CM

## 2023-06-15 DIAGNOSIS — M542 Cervicalgia: Secondary | ICD-10-CM

## 2023-06-15 NOTE — Therapy (Addendum)
 OUTPATIENT PHYSICAL THERAPY FEMALE PELVIC TREATMENT   Patient Name: Kendra Soto MRN: [id number] DOB:[birth year], 48 y.o., female Today's Date: 06/15/2023  END OF SESSION:  PT End of Session - 06/15/23 0836     Visit Number 3    Date for PT Re-Evaluation 11/04/23    Authorization Type [employer]na [employer] 2025  no auth req    PT Start Time 0800    PT Stop Time 0840    PT Time Calculation (min) 40 min    Activity Tolerance Patient tolerated treatment well    Behavior During Therapy WFL for tasks assessed/performed              Past Medical History:  Diagnosis Date   Allergies    Arthritis    Asthma    Dysrhythmia    GERD (gastroesophageal reflux disease)    History of kidney stones    Hx of migraine headaches    Hypothyroid    Overactive bladder    Palpitations    Pre-diabetes    Past Surgical History:  Procedure Laterality Date   BALLOON DILATION N/A 03/06/2023   Procedure: BALLOON DILATION;  Surgeon: Lanelle Bal, DO;  [facility];  Service: Endoscopy;  Laterality: N/A;   BIOPSY  03/06/2023   Procedure: BIOPSY;  Surgeon: Lanelle Bal, DO;  [facility];  Service: Endoscopy;;   CHOLECYSTECTOMY  2016   COLONOSCOPY WITH PROPOFOL N/A 12/03/2021   Procedure: COLONOSCOPY WITH PROPOFOL;  Surgeon: Lanelle Bal, DO;  [facility];  Service: Endoscopy;  Laterality: N/A;  10:30am, asa 2   ESOPHAGOGASTRODUODENOSCOPY N/A 04/30/2018   normal. Empiric dilatation   ESOPHAGOGASTRODUODENOSCOPY (EGD) WITH PROPOFOL N/A 03/06/2023   Procedure: ESOPHAGOGASTRODUODENOSCOPY (EGD) WITH PROPOFOL;  Surgeon: Lanelle Bal, DO;  [facility];  Service: Endoscopy;  Laterality: N/A;  900am, asa 2   MALONEY DILATION N/A 04/30/2018   Procedure: MALONEY DILATION;  Surgeon: Corbin Ade, MD;  [facility];  Service: Endoscopy;  Laterality: N/A;   POLYPECTOMY  12/03/2021   Procedure: POLYPECTOMY;  Surgeon: Lanelle Bal,  DO;  [facility];  Service: Endoscopy;;   TUBAL LIGATION     VAGINAL HYSTERECTOMY N/A 01/22/2021   Procedure: HYSTERECTOMY VAGINAL;  Surgeon: Myna Hidalgo, DO;  Location: [facility];  Service: Gynecology;  Laterality: N/A;   WISDOM TOOTH EXTRACTION     Patient Active Problem List   Diagnosis Date Noted   Seasonal and perennial allergic rhinitis 02/14/2022   Not well controlled moderate persistent asthma 02/14/2022   Rheumatoid arthritis (HCC) 04/12/2020   Chronic RUQ pain 04/16/2018   Abdominal pain, chronic, epigastric 04/16/2018   GERD (gastroesophageal reflux disease) 04/16/2018   Esophageal dysphagia 04/16/2018    PCP: Donetta Potts, MD  REFERRING PROVIDER: Myna Hidalgo, DO  REFERRING DIAG: N32.81 (ICD-10-CM) - Overactive bladder N81.10 (ICD-10-CM) - POP-Q stage 1 cystocele  THERAPY DIAG:  Other lack of coordination  Muscle weakness (generalized)  Cervicalgia  Rationale for Evaluation and Treatment: Rehabilitation  ONSET DATE: 40 years, has gotten worse  SUBJECTIVE:  SUBJECTIVE STATEMENT: Pt reports that she is trying to wait longer between voids. Has some increased urgency after sit to stand after she sits for 30 mins/ hour.  Doe urge drills when she is out and about.  Nervous about the leakage.  Constipation is not there right now. Occasionally constipated Reports exercises are going well Dyspareunia in some positions, not using lubricant, feels like penis is hitting a nerve    Fluid intake: different every day- mostly water, about 20-40 oz    PAIN:  Are you having pain? Yes NPRS scale: 2-3/10 Pain location: Internal  Pain type: dull Pain description: intermittent depends on if bladder is irritated  Aggravating factors: holding pee too long Relieving  factors: urinating  PRECAUTIONS: None  RED FLAGS: None   WEIGHT BEARING RESTRICTIONS: No  FALLS:  Has patient fallen in last 6 months? No  OCCUPATION: [employer]  ACTIVITY LEVEL : active  PLOF: Independent  PATIENT GOALS: not leak   PERTINENT HISTORY:  Hysterectomy 2022 Sexual abuse: No  BOWEL MOVEMENT: Pain with bowel movement: Yes Type of bowel movement:Type (Bristol Stool Scale) 1-7, used to poop every day, now not every day sometimes can go a few days Fully empty rectum: No not always Leakage: No Pads: No Fiber supplement/laxative No  URINATION: Pain with urination: No Fully empty bladder: Yes:    on new medicine Stream: Strong on new medicine Urgency: Yes  Frequency: 3-4 time at the most on a good day. At times 2x/day, sometimes 5-6 times Leakage: Urge to void Pads: No  INTERCOURSE:  Ability to have vaginal penetration Yes  Pain with intercourse: During Penetration with some positions DrynessYes  Climax: yes Marinoff Scale: 1/3 Laxative:  PREGNANCY: Vaginal deliveries 3 Tearing Yes:   Episiotomy No C-section deliveries 0 Currently pregnant No  PROLAPSE: Pressure and Bulge   OBJECTIVE:  Note: Objective measures were completed at Evaluation unless otherwise noted.  COGNITION: Overall cognitive status: Within functional limits for tasks assessed     SENSATION: Light touch: Appears intact   POSTURE: rounded shoulders and forward head   LUMBARAROM/PROM: grossly WFL  LOWER EXTREMITY ROM: grossly within functional limitations    LOWER EXTREMITY MMT: grossly at least 4/5 overall  PALPATION:   General: upper chest breathing, accessory breathing, abdominal gripping and  tender and overactive bilateral UT  Pelvic Alignment: seems even  Abdominal: gripping, restricted abdominal scar around belly button                External Perineal Exam: seems within functional limitations                              Internal Pelvic Floor: tight  and tender left bulbo  Patient confirms identification and approves PT to assess internal pelvic floor and treatment Yes  PELVIC MMT:   MMT eval  Vaginal 2/5   Internal Anal Sphincter   External Anal Sphincter   Puborectalis   Diastasis Recti   (Blank rows = not tested)        TONE: anterior and posterior wall laxity present, tailbone palpated   PROLAPSE:  wall laxity present   TODAY'S TREATMENT:  DATE: 06/15/23   Manual- abdominal massage for constipation               Scar massage    Therapeutic exercises- ball press with transverse abdominis breath seated and supine     Therapeutic activities- oh nuts- education                                     Review ob bladder diary                                   PATIENT EDUCATION:  Education details: urge suppression techniques, bladder diary,  Person educated: Patient Education method: Explanation, Demonstration, Tactile cues, Verbal cues, and Handouts Education comprehension: verbalized understanding, returned demonstration, verbal cues required, tactile cues required, and needs further education  HOME EXERCISE PROGRAM: [email or web address]  ASSESSMENT:  CLINICAL IMPRESSION: Tx focus- abdominal scar massage, review of bladder diary, strategies for dyspareunia. Pt progressing well, seeing some changes in urinary urgency. Discussed wearing pads while retraining bladder to help with fear of leaking. Some tightness and tenderness in abdominal scar can contribute to dyspareunia and guarding and upper chest breathing.     OBJECTIVE IMPAIRMENTS: decreased coordination, decreased strength, and pain.   ACTIVITY LIMITATIONS: continence and toileting  PARTICIPATION LIMITATIONS: occupation  PERSONAL FACTORS: Time since onset of injury/illness/exacerbation are also affecting patient's functional outcome.    REHAB POTENTIAL: Good  CLINICAL DECISION MAKING: Evolving/moderate complexity  EVALUATION COMPLEXITY: Moderate   GOALS: Goals reviewed with patient? Yes  SHORT TERM GOALS: Target date: 06/11/2023    Pt will fill out bladder diary Baseline: Goal status: met  2.  Pt will increase her water intake Baseline:  Goal status: progressing  3.  Pt will be I with urge drill Baseline:  Goal status: progressing  4.  Pt will be I with her initial HEP Baseline:  Goal status: progressing- needs to focus on exhale with exertion  LONG TERM GOALS: Target date: 6 months- 11/04/2023  Pt will be I with advanced HEP Baseline:  Goal status: INITIAL  2.  Pt will soak 0 pads/ day Baseline:  Goal status: INITIAL  3.  Pt will dem good bladder habits Baseline:  Goal status: INITIAL  4.  Pt will report 0 pain with IC Baseline:  Goal status: INITIAL  5.  Pt will report regular BMs at least every other day without straining Baseline:  Goal status: INITIAL              6. Pt will improve her pelvic floor strength to at least 4/5 MMT  PLAN:  PT FREQUENCY: 1x/week  PT DURATION: 6 months  PLANNED INTERVENTIONS: 97110-Therapeutic exercises, 97530- Therapeutic activity, 97112- Neuromuscular re-education, 97535- Self Care, 16109- Manual therapy, Dry Needling, Joint mobilization, Joint manipulation, Spinal manipulation, Spinal mobilization, Scar mobilization, and Biofeedback  PLAN FOR NEXT SESSION: cont pelvic floor strengthening and core coordination exercises Lenzie Montesano, PT 06/15/23 8:41 AM

## 2023-06-18 ENCOUNTER — Ambulatory Visit: Admitting: Gastroenterology

## 2023-06-18 ENCOUNTER — Encounter: Payer: Self-pay | Admitting: Gastroenterology

## 2023-06-18 VITALS — BP 109/66 | HR 88 | Temp 98.8°F | Ht 65.0 in | Wt 195.8 lb

## 2023-06-18 DIAGNOSIS — K219 Gastro-esophageal reflux disease without esophagitis: Secondary | ICD-10-CM | POA: Diagnosis not present

## 2023-06-18 DIAGNOSIS — K602 Anal fissure, unspecified: Secondary | ICD-10-CM

## 2023-06-18 NOTE — Progress Notes (Signed)
 Gastroenterology Office Note     Primary Care Physician:  Donetta Potts, MD  Primary Gastroenterologist: Dr. Marletta Lor   Chief Complaint   Chief Complaint  Patient presents with   Follow-up    Has hemorrhoids that she wants to discuss     History of Present Illness   Makyla Bye is a 48 y.o. female presenting today with history of GERD and dysphagia, s/p EGD with dilation in Dec 2024, now with concerns for hemorrhoids. Last colonoscopy 2023 as below with 10 year recall.    Since colonoscopy has been more aware of hemorrhoids. Some itching. Occasionally a sharp pain in rectum, not associated with bowel movements. Feels like glass coming out. Some blood with wiping at times. No prolapsing tissue. Felt a little bump at one time like a "beebee" under the skin.   Some straining at times. BM usually daily, sometimes a few days in between. No dysphagia. PPI daily.   Uses [facility].    EGD Dec 2024: gastritis s/p biopsy, normal duodenum. Negative H.pylori   Screening colonoscopy 12/03/2021 internal hemorrhoids, diverticulosis of the sigmoid colon, 4 mm polyp removed from the sigmoid colon. Benign hyperplastic. 10-year recall given.       Past Medical History:  Diagnosis Date   Allergies    Arthritis    Asthma    Dysrhythmia    GERD (gastroesophageal reflux disease)    History of kidney stones    Hx of migraine headaches    Hypothyroid    Overactive bladder    Palpitations    Pre-diabetes     Past Surgical History:  Procedure Laterality Date   BALLOON DILATION N/A 03/06/2023   Procedure: BALLOON DILATION;  Surgeon: Lanelle Bal, DO;  [facility];  Service: Endoscopy;  Laterality: N/A;   BIOPSY  03/06/2023   Procedure: BIOPSY;  Surgeon: Lanelle Bal, DO;  [facility];  Service: Endoscopy;;   CHOLECYSTECTOMY  2016   COLONOSCOPY WITH PROPOFOL N/A 12/03/2021   Procedure: COLONOSCOPY WITH PROPOFOL;  Surgeon: Lanelle Bal, DO;  [facility];  Service: Endoscopy;  Laterality: N/A;  10:30am, asa 2   ESOPHAGOGASTRODUODENOSCOPY N/A 04/30/2018   normal. Empiric dilatation   ESOPHAGOGASTRODUODENOSCOPY (EGD) WITH PROPOFOL N/A 03/06/2023   Procedure: ESOPHAGOGASTRODUODENOSCOPY (EGD) WITH PROPOFOL;  Surgeon: Lanelle Bal, DO;  [facility];  Service: Endoscopy;  Laterality: N/A;  900am, asa 2   MALONEY DILATION N/A 04/30/2018   Procedure: MALONEY DILATION;  Surgeon: Corbin Ade, MD;  [facility];  Service: Endoscopy;  Laterality: N/A;   POLYPECTOMY  12/03/2021   Procedure: POLYPECTOMY;  Surgeon: Lanelle Bal, DO;  [facility];  Service: Endoscopy;;   TUBAL LIGATION     VAGINAL HYSTERECTOMY N/A 01/22/2021   Procedure: HYSTERECTOMY VAGINAL;  Surgeon: Myna Hidalgo, DO;  Location: [facility];  Service: Gynecology;  Laterality: N/A;   WISDOM TOOTH EXTRACTION      Current Outpatient Medications  Medication Sig Dispense Refill   albuterol (VENTOLIN HFA) 108 (90 Base) MCG/ACT inhaler Inhale 1-2 puffs into the lungs every 6 (six) hours as needed for wheezing or shortness of breath. 8.5 g 1   clobetasol ointment (TEMOVATE) 0.05 % Apply 1 Application topically 2 (two) times daily as needed (irritation).     EPINEPHrine (EPIPEN 2-PAK) 0.3 mg/0.3 mL IJ SOAJ injection Inject 0.3 mg into the muscle as needed for anaphylaxis. 2 each 1  flecainide (TAMBOCOR) 50 MG tablet TAKE 1 TABLET BY MOUTH TWICE DAILY 180 tablet 3   fluticasone-salmeterol (WIXELA INHUB) 500-50 MCG/ACT AEPB Inhale 1 puff into the lungs 2 (two) times daily. in the morning and at bedtime. 60 each 5   Galcanezumab-gnlm (EMGALITY) 120 MG/ML SOAJ Inject 120 mg into the skin every 30 (thirty) days. 1 mL 11   GEMTESA 75 MG TABS Take 1 tablet by mouth daily.     ipratropium (ATROVENT) 0.03 % nasal spray Place 1 spray into both nostrils 3 (three) times daily as needed for rhinitis. 30 mL 5   levocetirizine  (XYZAL) 5 MG tablet Take 1 tablet (5 mg total) by mouth daily as needed for allergies (Can take an extra dose during flare ups.). 180 tablet 1   levothyroxine (SYNTHROID) 75 MCG tablet Take 75 mcg by mouth daily before breakfast.     montelukast (SINGULAIR) 10 MG tablet Take 1 tablet (10 mg total) by mouth at bedtime. 90 tablet 1   pantoprazole (PROTONIX) 40 MG tablet Take 1 tablet (40 mg total) by mouth 2 (two) times daily. (Patient taking differently: Take 40 mg by mouth daily.) 60 tablet 11   Spacer/Aero-Holding Chambers DEVI 1 Device by Does not apply route as directed. 1 each 1   SPIRIVA RESPIMAT 1.25 MCG/ACT AERS Inhale 2 Inhalations into the lungs daily. 4 g 5   No current facility-administered medications for this visit.    Allergies as of 06/18/2023 - Review Complete 06/18/2023  Allergen Reaction Noted   Bee pollen Cough, Other (See Comments), and Shortness Of Breath 06/07/2021   Short ragweed pollen ext Cough, Other (See Comments), and Shortness Of Breath 06/07/2021    Family History  Problem Relation Age of Onset   Breast cancer Mother    Hypertension Mother    Migraines Mother    Allergic rhinitis Father    Cancer Father        prostate   Hypertension Father    Migraines Sister    Migraines Sister    Rheum arthritis Sister    Migraines Daughter    Headache Daughter    Allergic rhinitis Brother    Headache Son    Colon cancer Neg Hx    Celiac disease Neg Hx    Crohn's disease Neg Hx    Colon polyps Neg Hx     Social History   Socioeconomic History   Marital status: Married    Spouse name: Not on file   Number of children: 3   Years of education: Not on file   Highest education level: Not on file  Occupational History   Not on file  Tobacco Use   Smoking status: Former    Current packs/day: 0.00    Average packs/day: 1 pack/day for 20.0 years (20.0 ttl pk-yrs)    Types: Cigarettes    Start date: 33    Quit date: 2009    Years since quitting: 16.2    Smokeless tobacco: Never  Vaping Use   Vaping status: Never Used  Substance and Sexual Activity   Alcohol use: No   Drug use: No   Sexual activity: Yes    Birth control/protection: Surgical    Comment: hyst  Other Topics Concern   Not on file  Social History Narrative   Caffeine: max of 1 cup in a day, mostly avoids    Social Drivers of Health   Financial Resource Strain: High Risk (03/05/2022)   Overall Financial Resource Strain (CARDIA)  Difficulty of Paying Living Expenses: Hard  Food Insecurity: Food Insecurity Present (03/05/2022)   Hunger Vital Sign    Worried About Running Out of Food in the Last Year: Sometimes true    Ran Out of Food in the Last Year: Never true  Transportation Needs: No Transportation Needs (03/05/2022)   PRAPARE - Administrator, Civil Service (Medical): No    Lack of Transportation (Non-Medical): No  Physical Activity: Inactive (03/05/2022)   Exercise Vital Sign    Days of Exercise per Week: 0 days    Minutes of Exercise per Session: 0 min  Stress: Stress Concern Present (03/05/2022)   [employer] of Occupational Health - Occupational Stress Questionnaire    Feeling of Stress : Rather much  Social Connections: Moderately Integrated (03/05/2022)   Social Connection and Isolation Panel [NHANES]    Frequency of Communication with Friends and Family: More than three times a week    Frequency of Social Gatherings with Friends and Family: Twice a week    Attends Religious Services: More than 4 times per year    Active Member of [employer] or Organizations: No    Attends Banker Meetings: Never    Marital Status: Married  Catering manager Violence: Not At Risk (03/05/2022)   Humiliation, Afraid, Rape, and Kick questionnaire    Fear of Current or Ex-Partner: No    Emotionally Abused: No    Physically Abused: No    Sexually Abused: No     Review of Systems   Gen: Denies any fever, chills, fatigue, weight loss, lack of  appetite.  CV: Denies chest pain, heart palpitations, peripheral edema, syncope.  Resp: Denies shortness of breath at rest or with exertion. Denies wheezing or cough.  GI: Denies dysphagia or odynophagia. Denies jaundice, hematemesis, fecal incontinence. GU : Denies urinary burning, urinary frequency, urinary hesitancy MS: Denies joint pain, muscle weakness, cramps, or limitation of movement.  Derm: Denies rash, itching, dry skin Psych: Denies depression, anxiety, memory loss, and confusion Heme: Denies bruising, bleeding, and enlarged lymph nodes.   Physical Exam   BP 109/66 (BP Location: Right Arm, Patient Position: Sitting, Cuff Size: Large)   Pulse 88   Temp 98.8 F (37.1 C) (Oral)   Ht 5\' 5"  (1.651 m)   Wt 195 lb 12.8 oz (88.8 kg)   LMP 01/22/2021 Comment: Pregnancy test negative on 01/18/2021  SpO2 98%   BMI 32.58 kg/m  General:   Alert and oriented. Pleasant and cooperative. Well-nourished and well-developed.  Head:  Normocephalic and atraumatic. Rectal:  small external hemorrhoids non-thrombosed. No significant discomfort on DRE. Unable to appreciate obvious anal fissure.  Msk:  Symmetrical without gross deformities. Normal posture. Extremities:  Without edema. Neurologic:  Alert and  oriented x4;  grossly normal neurologically. Skin:  Intact without significant lesions or rashes. Psych:  Alert and cooperative. Normal mood and affect.   Assessment   Kendra Soto is a 48 y.o. female presenting today with a history of GERD and dysphagia, s/p EGD with dilation in Dec 2024, now with concerns for hemorrhoids. Last colonoscopy 2023 with internal hemorrhoids.  Symptoms most consistent with predominantly anal fissure. She does have some symptoms related to internal hemorrhoids but no significant prolapsing, significant bleeding. Will need to treat anal fissure first, then can regroup on banding.   GERD: decreased PPI to once daily with good control. Dysphagia resolved s/p  empiric dilation.       PLAN    Called in compounded cream  to [facility] with diltiazem, apply 3-4 times a day for next 2-4 weeks Add fiber, stool softener, miralax prn to keep stools soft Return in 4-6 weeks Consider banding at that time   Gelene Mink, PhD, ANP-BC [facility] Gastroenterology

## 2023-06-18 NOTE — Patient Instructions (Signed)
 I am calling in a compounded hemorrhoid cream to [facility]. You will use this 3-4 times a day for the next 2-4 weeks.   I will see you back in about 4-6 weeks! If you have symptoms that persist consistent with hemorrhoids, we can certainly do a banding. Right now, we need to treat a likely anal fissure. The good news is that it is not severe!  Keep stools soft. Add Benefiber to beverage of choice each morning. You can take a stool softener or Miralax daily as needed. We want to avoid straining and limit toilet time to 2-3 minutes.  I enjoyed seeing you again today! I value our relationship and want to provide genuine, compassionate, and quality care. You may receive a survey regarding your visit with me, and I welcome your feedback! Thanks so much for taking the time to complete this. I look forward to seeing you again.      Gelene Mink, PhD, ANP-BC [facility] Gastroenterology

## 2023-06-19 ENCOUNTER — Other Ambulatory Visit: Payer: Self-pay | Admitting: Internal Medicine

## 2023-06-22 NOTE — Progress Notes (Signed)
 Error

## 2023-06-24 ENCOUNTER — Ambulatory Visit (INDEPENDENT_AMBULATORY_CARE_PROVIDER_SITE_OTHER): Payer: Self-pay

## 2023-06-24 DIAGNOSIS — J309 Allergic rhinitis, unspecified: Secondary | ICD-10-CM

## 2023-06-26 ENCOUNTER — Encounter: Payer: Self-pay | Admitting: Cardiology

## 2023-06-26 ENCOUNTER — Ambulatory Visit: Payer: 59 | Attending: Cardiology | Admitting: Cardiology

## 2023-06-26 VITALS — BP 102/64 | HR 79 | Ht 65.0 in | Wt 196.0 lb

## 2023-06-26 DIAGNOSIS — R002 Palpitations: Secondary | ICD-10-CM

## 2023-06-26 DIAGNOSIS — I493 Ventricular premature depolarization: Secondary | ICD-10-CM

## 2023-06-26 NOTE — Patient Instructions (Signed)
 Medication Instructions:  The current medical regimen is effective;  continue present plan and medications.  *If you need a refill on your cardiac medications before your next appointment, please call your pharmacy*  Follow-Up: At [facility], you and your health needs are our priority.  As part of our continuing mission to provide you with exceptional heart care, our providers are all part of one team.  This team includes your primary Cardiologist (physician) and Advanced Practice Providers or APPs (Physician Assistants and Nurse Practitioners) who all work together to provide you with the care you need, when you need it.  Your next appointment:   1 year(s)  Provider:   Dr Donato Schultz   We recommend signing up for the patient portal called "MyChart".  Sign up information is provided on this After Visit Summary.  MyChart is used to connect with patients for Virtual Visits (Telemedicine).  Patients are able to view lab/test results, encounter notes, upcoming appointments, etc.  Non-urgent messages can be sent to your provider as well.   To learn more about what you can do with MyChart, go to [email or web address].        1st Floor: - Lobby - Registration  - Pharmacy  - Lab - Cafe  2nd Floor: - PV Lab - Diagnostic Testing (echo, CT, nuclear med)  3rd Floor: - Vacant  4th Floor: - TCTS (cardiothoracic surgery) - AFib Clinic - Structural Heart Clinic - Vascular Surgery  - Vascular Ultrasound  5th Floor: - HeartCare Cardiology (general and EP) - Clinical Pharmacy for coumadin, hypertension, lipid, weight-loss medications, and med management appointments    Valet parking services will be available as well.

## 2023-06-26 NOTE — Progress Notes (Signed)
 Cardiology Office Note:  .   Date:  06/26/2023  ID:  Kendra Soto, DOB 02-14-1976, MRN [id number] PCP: Donetta Potts, MD  [facility] Providers Cardiologist:  Donato Schultz, MD    History of Present Illness: .   Kendra Soto is a 48 y.o. female Discussed the use of AI scribe software for clinical note transcription with the patient, who gave verbal consent to proceed.  History of Present Illness Kendra Soto is a 48 year old female who presents for follow-up of palpitations.  She experiences palpitations described as a 'thumping and a pause,' particularly noticeable when lying on her left side. A previous monitor showed a PVC burden of 13%, which has significantly reduced with treatment. Initially, she tried Toprol 25 mg without much response, but started on flecainide 50 mg twice a day, which has helped significantly. No chest pain, shortness of breath, or fainting episodes.  In 2021, she underwent several tests including a stress test and an echocardiogram, both of which were unremarkable. A nuclear stress test at [facility] in 2021 showed no ischemia. A monitor in 2023 showed a PVC burden of 12.8%.  She has a history of hypothyroidism and is currently taking Synthroid 75 mcg. There are no reported issues with her thyroid management at this time.  She manages allergies with inhalers and has started immunotherapy shots, which she believes are helping. Pollen has been particularly bothersome recently, causing her nose to run constantly, but symptoms have since improved.      Studies Reviewed: Marland Kitchen   EKG Interpretation Date/Time:  Friday June 26 2023 16:22:44 EDT Ventricular Rate:  79 PR Interval:  144 QRS Duration:  72 QT Interval:  376 QTC Calculation: 431 R Axis:   45  Text Interpretation: Normal sinus rhythm Normal ECG When compared with ECG of 18-Jan-2021 10:17, Premature ventricular complexes are no longer Present Confirmed by Donato Schultz ([id number]) on 06/26/2023 4:35:56 PM     Results DIAGNOSTIC EKG: No PVCs (06/26/2023) Holter monitor: PVC burden 12.8% (2023) Nuclear stress test: No ischemia (2021) Risk Assessment/Calculations:            Physical Exam:   VS:  BP 102/64   Pulse 79   Ht 5\' 5"  (1.651 m)   Wt 196 lb (88.9 kg)   LMP 01/22/2021 Comment: Pregnancy test negative on 01/18/2021  BMI 32.62 kg/m    Wt Readings from Last 3 Encounters:  06/26/23 196 lb (88.9 kg)  06/18/23 195 lb 12.8 oz (88.8 kg)  05/04/23 192 lb 6 oz (87.3 kg)    GEN: Well nourished, well developed in no acute distress NECK: No JVD; No carotid bruits CARDIAC: RRR, no murmurs, no rubs, no gallops RESPIRATORY:  Clear to auscultation without rales, wheezing or rhonchi  ABDOMEN: Soft, non-tender, non-distended EXTREMITIES:  No edema; No deformity   ASSESSMENT AND PLAN: .    Assessment and Plan Assessment & Plan Premature Ventricular Contractions (PVCs) She has a significant 13% PVC burden, with palpitations described as a thumping and a pause, especially when lying on her left side. Flecainide 50 mg twice daily effectively suppresses PVCs, as shown by the current EKG with no PVCs. The medication is well-tolerated with no adverse effects. The risk of EKG changes or adverse effects from flecainide is low, particularly at this low dose and given the absence of issues. Annual EKG monitoring is planned to detect any rare EKG changes early, though none have been observed in her case. - Continue flecainide 50 mg twice daily. -  Monitor EKG annually. - ETT normal - Encourage regular exercise and adherence to a Mediterranean diet.  Hypothyroidism She is on levothyroxine 75 mcg daily with no issues in her thyroid management. - Continue levothyroxine 75 mcg daily.  Allergic Rhinitis She experiences significant allergy symptoms during pollen season, managed with allergy medications and immunotherapy shots, which may aid in symptom control. - Continue current allergy medications and  immunotherapy shots.  Follow-up Her management plan is effective, and she is asymptomatic regarding her cardiac condition. - Schedule follow-up in one year unless symptoms change.          Signed, Donato Schultz, MD

## 2023-06-30 ENCOUNTER — Ambulatory Visit: Payer: 59 | Attending: Obstetrics & Gynecology | Admitting: Physical Therapy

## 2023-06-30 ENCOUNTER — Encounter: Payer: Self-pay | Admitting: Physical Therapy

## 2023-06-30 DIAGNOSIS — R278 Other lack of coordination: Secondary | ICD-10-CM | POA: Diagnosis present

## 2023-06-30 DIAGNOSIS — M6281 Muscle weakness (generalized): Secondary | ICD-10-CM | POA: Diagnosis present

## 2023-06-30 NOTE — Therapy (Signed)
 OUTPATIENT PHYSICAL THERAPY FEMALE PELVIC TREATMENT   Patient Name: Kendra Soto MRN: [id number] DOB:[birth year], 48 y.o., female Today's Date: 06/30/2023  END OF SESSION:  PT End of Session - 06/30/23 1027     Visit Number 4    Date for PT Re-Evaluation 11/04/23    Authorization Type [employer] [id number]  no auth req    PT Start Time 1010    PT Stop Time 1055    PT Time Calculation (min) 45 min    Activity Tolerance Patient tolerated treatment well    Behavior During Therapy WFL for tasks assessed/performed               Past Medical History:  Diagnosis Date   Allergies    Arthritis    Asthma    Dysrhythmia    GERD (gastroesophageal reflux disease)    History of kidney stones    Hx of migraine headaches    Hypothyroid    Overactive bladder    Palpitations    Pre-diabetes    Past Surgical History:  Procedure Laterality Date   BALLOON DILATION N/A 03/06/2023   Procedure: BALLOON DILATION;  Surgeon: Vinetta Greening, DO;  [facility];  Service: Endoscopy;  Laterality: N/A;   BIOPSY  03/06/2023   Procedure: BIOPSY;  Surgeon: Vinetta Greening, DO;  [facility]: [facility];  Service: Endoscopy;;   CHOLECYSTECTOMY  2016   COLONOSCOPY WITH PROPOFOL N/A 12/03/2021   Procedure: COLONOSCOPY WITH PROPOFOL;  Surgeon: Vinetta Greening, DO;  [facility]: [facility];  Service: Endoscopy;  Laterality: N/A;  10:30am, asa 2   ESOPHAGOGASTRODUODENOSCOPY N/A 04/30/2018   normal. Empiric dilatation   ESOPHAGOGASTRODUODENOSCOPY (EGD) WITH PROPOFOL N/A 03/06/2023   Procedure: ESOPHAGOGASTRODUODENOSCOPY (EGD) WITH PROPOFOL;  Surgeon: Vinetta Greening, DO;  [facility]: [facility];  Service: Endoscopy;  Laterality: N/A;  900am, asa 2   MALONEY DILATION N/A 04/30/2018   Procedure: MALONEY DILATION;  Surgeon: Suzette Espy, MD;  Location: [facility];  Service: Endoscopy;  Laterality: N/A;   POLYPECTOMY  12/03/2021   Procedure: POLYPECTOMY;  Surgeon: Vinetta Greening, DO;  Location: [facility];  Service: Endoscopy;;   TUBAL LIGATION     VAGINAL HYSTERECTOMY N/A 01/22/2021   Procedure: HYSTERECTOMY VAGINAL;  Surgeon: Ozan, Jennifer, DO;  Location: [facility];  Service: Gynecology;  Laterality: N/A;   WISDOM TOOTH EXTRACTION     Patient Active Problem List   Diagnosis Date Noted   Anal fissure 06/18/2023   Seasonal and perennial allergic rhinitis 02/14/2022   Not well controlled moderate persistent asthma 02/14/2022   Rheumatoid arthritis (HCC) 04/12/2020   Chronic RUQ pain 04/16/2018   Abdominal pain, chronic, epigastric 04/16/2018   GERD (gastroesophageal reflux disease) 04/16/2018   Esophageal dysphagia 04/16/2018    PCP: Lauran Pollard, MD  REFERRING PROVIDER: Ozan, Jennifer, DO  REFERRING DIAG: N32.81 (ICD-10-CM) - Overactive bladder N81.10 (ICD-10-CM) - POP-Q stage 1 cystocele  THERAPY DIAG:  Other lack of coordination  Muscle weakness (generalized)  Rationale for Evaluation and Treatment: Rehabilitation  ONSET DATE: 39 years, has gotten worse  SUBJECTIVE:  SUBJECTIVE STATEMENT: Pt reports that she is improving overall She had a moment last week when she leaked - urge was strong at work and she did not make it to the bathroom Constipation not so good Was constipated some last week Has not started the fiber yet Does body pump 2 / week, walks other days at work Does free weights Leaves house early now vs last year, Started Job in July, was able to have more flexibility before.  Pt reports that sex is not so uncomfortable. Depends on position and how deep the penis goes.    Fluid intake: different every day- mostly water, about 20-40 oz    PAIN:  Are you having pain? Yes NPRS scale: 2-3/10 Pain location: Internal  Pain type: dull Pain  description: intermittent depends on if bladder is irritated  Aggravating factors: holding pee too long Relieving factors: urinating  PRECAUTIONS: None  RED FLAGS: None   WEIGHT BEARING RESTRICTIONS: No  FALLS:  Has patient fallen in last 6 months? No  OCCUPATION: [employer]  ACTIVITY LEVEL : active  PLOF: Independent  PATIENT GOALS: not leak   PERTINENT HISTORY:  Hysterectomy 2022 Sexual abuse: No  BOWEL MOVEMENT: Pain with bowel movement: Yes Type of bowel movement:Type (Bristol Stool Scale) 1-7, used to poop every day, now not every day sometimes can go a few days Fully empty rectum: No not always Leakage: No Pads: No Fiber supplement/laxative No  URINATION: Pain with urination: No Fully empty bladder: Yes:    on new medicine Stream: Strong on new medicine Urgency: Yes  Frequency: 3-4 time at the most on a good day. At times 2x/day, sometimes 5-6 times Leakage: Urge to void Pads: No  INTERCOURSE:  Ability to have vaginal penetration Yes  Pain with intercourse: During Penetration with some positions DrynessYes  Climax: yes Marinoff Scale: 1/3 Laxative:  PREGNANCY: Vaginal deliveries 3 Tearing Yes:   Episiotomy No C-section deliveries 0 Currently pregnant No  PROLAPSE: Pressure and Bulge   OBJECTIVE:  Note: Objective measures were completed at Evaluation unless otherwise noted.  COGNITION: Overall cognitive status: Within functional limits for tasks assessed     SENSATION: Light touch: Appears intact   POSTURE: rounded shoulders and forward head   LUMBARAROM/PROM: grossly WFL  LOWER EXTREMITY ROM: grossly within functional limitations    LOWER EXTREMITY MMT: grossly at least 4/5 overall  PALPATION:   General: upper chest breathing, accessory breathing, abdominal gripping and  tender and overactive bilateral UT  Pelvic Alignment: seems even  Abdominal: gripping, restricted abdominal scar around belly button                 External Perineal Exam: seems within functional limitations                              Internal Pelvic Floor: tight and tender left bulbo  Patient confirms identification and approves PT to assess internal pelvic floor and treatment Yes  PELVIC MMT:   MMT eval  Vaginal 2/5   Internal Anal Sphincter   External Anal Sphincter   Puborectalis   Diastasis Recti   (Blank rows = not tested)        TONE: anterior and posterior wall laxity present, tailbone palpated   PROLAPSE:  wall laxity present   TODAY'S TREATMENT:  DATE: 06/30/23   Manual- abdominal massage for constipation- performed and pt educated on               Scar massage- umbilicus with diaphragmatic breathing     Therapeutic exercises- ball press with transverse abdominis breath seated and supine     Therapeutic activities-                                     Squatty potty                                      Fiber                                     Constipation  strategies   PATIENT EDUCATION:  Education details: urge suppression techniques, bladder diary,  Person educated: Patient Education method: Explanation, Demonstration, Tactile cues, Verbal cues, and Handouts Education comprehension: verbalized understanding, returned demonstration, verbal cues required, tactile cues required, and needs further education  HOME EXERCISE PROGRAM: [id number]  ASSESSMENT:  CLINICAL IMPRESSION: Tx focus- abdominal scar massage, abdominal massage, constipation strategies. Pt will start metamucil and get a squatty potty. Pt progressing slowly towards goals. Sees improvement. Pt JIC pees and does not wear a pad. Discussed wearing a pad during bladder retraining. Pt will take a break and will schedule PT PRN d/t financial reasons.    OBJECTIVE IMPAIRMENTS: decreased coordination, decreased strength,  and pain.   ACTIVITY LIMITATIONS: continence and toileting  PARTICIPATION LIMITATIONS: occupation  PERSONAL FACTORS: Time since onset of injury/illness/exacerbation are also affecting patient's functional outcome.   REHAB POTENTIAL: Good  CLINICAL DECISION MAKING: Evolving/moderate complexity  EVALUATION COMPLEXITY: Moderate   GOALS: Goals reviewed with patient? Yes  SHORT TERM GOALS: Target date: 06/11/2023    Pt will fill out bladder diary Baseline: Goal status: met  2.  Pt will increase her water intake Baseline:  Goal status: progressing  3.  Pt will be I with urge drill Baseline:  Goal status: progressing  4.  Pt will be I with her initial HEP Baseline:  Goal status: progressing- needs to focus on exhale with exertion  LONG TERM GOALS: Target date: 6 months- 11/04/2023  Pt will be I with advanced HEP Baseline:  Goal status: INITIAL  2.  Pt will soak 0 pads/ day Baseline:  Goal status: INITIAL  3.  Pt will dem good bladder habits Baseline:  Goal status: INITIAL  4.  Pt will report 0 pain with IC Baseline:  Goal status: INITIAL  5.  Pt will report regular BMs at least every other day without straining Baseline:  Goal status: INITIAL              6. Pt will improve her pelvic floor strength to at least 4/5 MMT  PLAN:  PT FREQUENCY: 1x/week  PT DURATION: 6 months  PLANNED INTERVENTIONS: 97110-Therapeutic exercises, 97530- Therapeutic activity, 97112- Neuromuscular re-education, 97535- Self Care, [id number]- Manual therapy, Dry Needling, Joint mobilization, Joint manipulation, Spinal manipulation, Spinal mobilization, Scar mobilization, and Biofeedback  PLAN FOR NEXT SESSION: cont pelvic floor strengthening and core coordination exercises Inri Sobieski, PT 06/30/23 10:32 AM

## 2023-07-02 ENCOUNTER — Encounter: Payer: 59 | Admitting: Physical Therapy

## 2023-07-10 ENCOUNTER — Ambulatory Visit (INDEPENDENT_AMBULATORY_CARE_PROVIDER_SITE_OTHER): Payer: Self-pay

## 2023-07-10 DIAGNOSIS — J309 Allergic rhinitis, unspecified: Secondary | ICD-10-CM | POA: Diagnosis not present

## 2023-07-17 ENCOUNTER — Other Ambulatory Visit (HOSPITAL_COMMUNITY): Payer: Self-pay

## 2023-07-17 ENCOUNTER — Telehealth: Payer: Self-pay

## 2023-07-17 ENCOUNTER — Ambulatory Visit: Payer: 59 | Admitting: Adult Health

## 2023-07-17 ENCOUNTER — Encounter: Payer: Self-pay | Admitting: Adult Health

## 2023-07-17 VITALS — BP 114/69 | HR 93 | Ht 66.0 in | Wt 197.6 lb

## 2023-07-17 DIAGNOSIS — G43101 Migraine with aura, not intractable, with status migrainosus: Secondary | ICD-10-CM | POA: Diagnosis not present

## 2023-07-17 DIAGNOSIS — G43009 Migraine without aura, not intractable, without status migrainosus: Secondary | ICD-10-CM

## 2023-07-17 MED ORDER — QULIPTA 60 MG PO TABS: 60.0000 mg | ORAL_TABLET | Freq: Every day | ORAL | 5 refills | Status: DC

## 2023-07-17 NOTE — Progress Notes (Addendum)
 PATIENT: Kendra Soto DOB: [birth year]  REASON FOR VISIT: follow up HISTORY FROM: patient PRIMARY NEUROLOGIST: Dr. Tresia Fruit  Chief Complaint  Patient presents with   Follow-up    RM 19, alone.  Migraine f/u. Feels weather affects migraines. (Rainy/stormy weather). Feels salty foods may trigger migraines. In the last month, has had about 4-5 migraines. One migraine, able to take rescue and resolved. Half were severe. Takes Nurtec prn. Feels it does not help, wanting to discuss other options for rescue meds.   Pain    Experiencing wrist pain on right side intermittently. Sx started 2 months ago. Denies any injuries. Feels it may be r/t weight bearing exercises.  Told she had CTS bilaterally years ago.     HISTORY OF PRESENT ILLNESS: Today 07/17/23:  Kendra Soto is a 48 y.o. female with a history of migraine headaches and paresthesias in the upper extremities. Returns today for follow-up.  Patient is now on Emgality .  Insurance would no longer approve Aimovig .  She states that she is getting approximately 4-5 migraines a month.  She does feel that Aimovig  worked better but insurance would not approve.  We also tried to get her on Qulipta  in the past but insurance would not approve.  She is open to trying a different medication if we feel that it may work better. she is using Excedrin Migraine.  Has tried Nurtec Ubrelvy and several triptans.  Reports that Nurtec and Ubrelvy did not offer any benefit.  The triptan medication caused significant side effects.  She does get an aura with some of her migraines.  Reports that she still having paresthesias in the upper extremities.  She has already had nerve conduction studies with EMG that showed early mild carpal tunnel syndrome.  Encouraged her to wear wrist splints.  She returns today for an evaluation.    08/12/22: Kendra Soto is a 48 y.o. female with a history of migraine headaches and paresthesias in the upper extremities.. Returns today for  follow-up.  She reports that her headache frequency has improved.  She has approximately 1 headache weekly.  Her frequency of headaches increase the week before her next injection is due.  She remains on Aimovig  140 mg monthly.  She takes Nurtec for abortive therapy.  She reports that it does not work consistently.  However she does not want to retry a triptan due to potential side effects.  Continues to have Numbness in both arms- notices when they are bent or when she is driving. Feels weaker in the UEs. Arms get tired quickly.  She states when she was at her previous neurologist she had nerve conduction studies (this was several years ago).  Reports that she was told then that she may have the beginning of carpal tunnel syndrome.  She did have MRI of the cervical spine which was unremarkable.   02/03/22: Kendra Soto is a 48 y.o. female who has been followed in this office for migraine headaches.. Returns today for follow-up.  She was originally prescribed Qulipta  however her insurance would not cover this.  She is now on Aimovig  70 mg monthly injection.  Reports that it has helped. But the last 1.5 weeks has been getting a headache daily. Left side and starts at the base of the neck and moves to the front. Some nausea and vomiting with severe headaches. Photophobia and phonophobia. Reports sister has a history of chiari malformation.  Patient was sent to physical therapy for neck pain and paresthesias radiating down both  arms.  She reports that it has persisted. Some improvement with ROM. Also notices a tremor in both arms with tedious activities such as eating, tying shoes. No family history of tremor.   Insurance only approved MRI of the brain did not approve cervical.  HISTORY Medical co-morbidities: asthma, hypothyroidism, prediabetes, PVCs, kidney stones   The patient presents for evaluation of headaches which began in childhood but have been worsening recently. She has had headaches daily for  the past weeks. No clear onset for worsening of her headaches. Headaches are described as sharp or throbbing pain from her left eye radiating to her occiput. They are associated with photophobia, phonophobia, nausea, and vomiting. Sometimes gets paresthesias on the left side of her scalp as well.    She has noticed significant neck pain, and headaches can be triggered by turning her neck L>R. Pain will radiate from her neck down her arms on both sides. Arms will go numb when she is driving and has them resting on the steering wheel. Feels her grip strength has worsened bilaterally and has been dropping things more frequently.    Shannon Darter and Excedrin as needed. Ubrelvy 100 mg helps some of the time and she almost always has to take a second dose. It takes several hours to start working.   She is currently undergoing workup for arrhythmias. Is planned to wear a heart monitor for the next 2 weeks.   Headache History: Onset: childhood Triggers: turning her neck L>R, salty food Aura: sparkles (rare) Location: left side, occiput, retro-orbital Quality/Description: sharp, throbbing Associated Symptoms:             Photophobia: yes             Phonophobia: yes             Nausea: yes Vomiting: yes Worse with activity?: yes Duration of headaches: up to 24 hours   Headache days per month: 21 Headache free days per month: 9   Current Treatment: Abortive Ubrelvy 100 mg PRN Excedrin   Preventative none   Prior Therapies                                 Metoprolol Topamax - contraindicated due to kidney stones Excedrin Imitrex 100 mg PRN - helped but caused neck and jaw pain Maxalt - lack of efficacy Ubrelvy 100 mg PRN    REVIEW OF SYSTEMS: Out of a complete 14 system review of symptoms, the patient complains only of the following symptoms, and all other reviewed systems are negative.  ALLERGIES: Allergies  Allergen Reactions   Bee Pollen Cough, Other (See Comments) and  Shortness Of Breath   Short Ragweed Pollen Ext Cough, Other (See Comments) and Shortness Of Breath    HOME MEDICATIONS: Outpatient Medications Prior to Visit  Medication Sig Dispense Refill   albuterol  (VENTOLIN  HFA) 108 (90 Base) MCG/ACT inhaler Inhale 1-2 puffs into the lungs every 6 (six) hours as needed for wheezing or shortness of breath. 8.5 g 1   clobetasol ointment (TEMOVATE) 0.05 % Apply 1 Application topically 2 (two) times daily as needed (irritation).     EPINEPHrine  (EPIPEN  2-PAK) 0.3 mg/0.3 mL IJ SOAJ injection Inject 0.3 mg into the muscle as needed for anaphylaxis. 2 each 1   flecainide  (TAMBOCOR ) 50 MG tablet TAKE 1 TABLET BY MOUTH TWICE DAILY 180 tablet 3   fluticasone -salmeterol (WIXELA INHUB) 500-50 MCG/ACT AEPB Inhale 1 puff into  the lungs 2 (two) times daily. in the morning and at bedtime. 60 each 5   Galcanezumab -gnlm (EMGALITY ) 120 MG/ML SOAJ Inject 120 mg into the skin every 30 (thirty) days. 1 mL 11   GEMTESA  75 MG TABS Take 1 tablet by mouth daily.     ipratropium (ATROVENT ) 0.03 % nasal spray Place 1 spray into both nostrils 3 (three) times daily as needed for rhinitis. 30 mL 5   levocetirizine (XYZAL ) 5 MG tablet TAKE 1 TABLET BY MOUTH DAILY AS NEEDED FOR ALLERGIES (CAN TAKE AN EXTRA DOSE DURING FLARE UPS.). 180 tablet 1   levothyroxine (SYNTHROID) 75 MCG tablet Take 75 mcg by mouth daily before breakfast.     montelukast  (SINGULAIR ) 10 MG tablet Take 1 tablet (10 mg total) by mouth at bedtime. 90 tablet 1   pantoprazole  (PROTONIX ) 40 MG tablet Take 1 tablet (40 mg total) by mouth 2 (two) times daily. (Patient taking differently: Take 40 mg by mouth daily.) 60 tablet 11   Spacer/Aero-Holding Chambers DEVI 1 Device by Does not apply route as directed. 1 each 1   SPIRIVA  RESPIMAT 1.25 MCG/ACT AERS Inhale 2 Inhalations into the lungs daily. 4 g 5   No facility-administered medications prior to visit.    PAST MEDICAL HISTORY: Past Medical History:  Diagnosis Date    Allergies    Arthritis    Asthma    Dysrhythmia    GERD (gastroesophageal reflux disease)    History of kidney stones    Hx of migraine headaches    Hypothyroid    Overactive bladder    Palpitations    Pre-diabetes     PAST SURGICAL HISTORY: Past Surgical History:  Procedure Laterality Date   BALLOON DILATION N/A 03/06/2023   Procedure: BALLOON DILATION;  Surgeon: Vinetta Greening, DO;  [facility];  Service: Endoscopy;  Laterality: N/A;   BIOPSY  03/06/2023   Procedure: BIOPSY;  Surgeon: Vinetta Greening, DO;  [facility]: [facility];  Service: Endoscopy;;   CHOLECYSTECTOMY  2016   COLONOSCOPY WITH PROPOFOL  N/A 12/03/2021   Procedure: COLONOSCOPY WITH PROPOFOL ;  Surgeon: Vinetta Greening, DO;  Location: [facility];  Service: Endoscopy;  Laterality: N/A;  10:30am, asa 2   ESOPHAGOGASTRODUODENOSCOPY N/A 04/30/2018   normal. Empiric dilatation   ESOPHAGOGASTRODUODENOSCOPY (EGD) WITH PROPOFOL  N/A 03/06/2023   Procedure: ESOPHAGOGASTRODUODENOSCOPY (EGD) WITH PROPOFOL ;  Surgeon: Vinetta Greening, DO;  [facility]: [facility];  Service: Endoscopy;  Laterality: N/A;  900am, asa 2   MALONEY DILATION N/A 04/30/2018   Procedure: MALONEY DILATION;  Surgeon: Suzette Espy, MD;  Location: [facility];  Service: Endoscopy;  Laterality: N/A;   POLYPECTOMY  12/03/2021   Procedure: POLYPECTOMY;  Surgeon: Vinetta Greening, DO;  Location: [facility];  Service: Endoscopy;;   TUBAL LIGATION     VAGINAL HYSTERECTOMY N/A 01/22/2021   Procedure: HYSTERECTOMY VAGINAL;  Surgeon: Ozan, Jennifer, DO;  Location: [facility];  Service: Gynecology;  Laterality: N/A;   WISDOM TOOTH EXTRACTION      FAMILY HISTORY: Family History  Problem Relation Age of Onset   Breast cancer Mother    Hypertension Mother    Migraines Mother    Allergic rhinitis Father    Cancer Father        prostate   Hypertension Father    Migraines Sister    Migraines Sister    Rheum arthritis Sister     Migraines Daughter    Headache Daughter    Allergic  rhinitis Brother    Headache Son    Colon cancer Neg Hx    Celiac disease Neg Hx    Crohn's disease Neg Hx    Colon polyps Neg Hx     SOCIAL HISTORY: Social History   Socioeconomic History   Marital status: Married    Spouse name: Not on file   Number of children: 3   Years of education: Associates-15   Highest education level: Not on file  Occupational History   Not on file  Tobacco Use   Smoking status: Former    Current packs/day: 0.00    Average packs/day: 1 pack/day for 20.0 years (20.0 ttl pk-yrs)    Types: Cigarettes    Start date: 4    Quit date: 2009    Years since quitting: 16.3   Smokeless tobacco: Never  Vaping Use   Vaping status: Never Used  Substance and Sexual Activity   Alcohol use: No   Drug use: No   Sexual activity: Yes    Birth control/protection: Surgical    Comment: hyst  Other Topics Concern   Not on file  Social History Narrative   Caffeine: max of 1 cup in a day, mostly avoids    Right handed    Social Drivers of Health   Financial Resource Strain: High Risk (03/05/2022)   Overall Financial Resource Strain (CARDIA)    Difficulty of Paying Living Expenses: Hard  Food Insecurity: Food Insecurity Present (03/05/2022)   Hunger Vital Sign    Worried About Running Out of Food in the Last Year: Sometimes true    Ran Out of Food in the Last Year: Never true  Transportation Needs: No Transportation Needs (03/05/2022)   PRAPARE - Administrator, Civil Service (Medical): No    Lack of Transportation (Non-Medical): No  Physical Activity: Inactive (03/05/2022)   Exercise Vital Sign    Days of Exercise per Week: 0 days    Minutes of Exercise per Session: 0 min  Stress: Stress Concern Present (03/05/2022)   [employer] of Occupational Health - Occupational Stress Questionnaire    Feeling of Stress : Rather much  Social Connections: Moderately Integrated (03/05/2022)    Social Connection and Isolation Panel [NHANES]    Frequency of Communication with Friends and Family: More than three times a week    Frequency of Social Gatherings with Friends and Family: Twice a week    Attends Religious Services: More than 4 times per year    Active Member of [employer] or Organizations: No    Attends Banker Meetings: Never    Marital Status: Married  Catering manager Violence: Not At Risk (03/05/2022)   Humiliation, Afraid, Rape, and Kick questionnaire    Fear of Current or Ex-Partner: No    Emotionally Abused: No    Physically Abused: No    Sexually Abused: No      PHYSICAL EXAM  Vitals:   07/17/23 0926  BP: 114/69  Pulse: 93  Weight: 197 lb 9.6 oz (89.6 kg)  Height: 5\' 6"  (1.676 m)    Body mass index is 31.89 kg/m.  Generalized: Well developed, in no acute distress   Neurological examination  Mentation: Alert oriented to time, place, history taking. Follows all commands speech and language fluent Cranial nerve II-XII: Pupils were equal round reactive to light. Extraocular movements were full, visual field were full on confrontational test. Facial sensation and strength were normal.  Head turning and shoulder shrug  were normal  and symmetric. Motor: The motor testing reveals 5 over 5 strength of all 4 extremities. Good symmetric motor tone is noted throughout.  Sensory: Sensory testing is intact to soft touch on all 4 extremities. No evidence of extinction is noted.  Coordination: Cerebellar testing reveals good finger-nose-finger and heel-to-shin bilaterally.  Gait and station: Gait is normal.  Reflexes: Deep tendon reflexes are symmetric and normal bilaterally.   DIAGNOSTIC DATA (LABS, IMAGING, TESTING) - I reviewed patient records, labs, notes, testing and imaging myself where available.  Lab Results  Component Value Date   WBC 10.0 06/10/2021   HGB 12.7 06/10/2021   HCT 36.5 06/10/2021   MCV 92 06/10/2021   PLT 288 06/10/2021       Component Value Date/Time   NA 138 01/18/2021 1038   K 3.4 (L) 01/18/2021 1038   CL 107 01/18/2021 1038   CO2 23 01/18/2021 1038   GLUCOSE 83 01/18/2021 1038   BUN 17 01/18/2021 1038   CREATININE 0.68 01/18/2021 1038   CALCIUM 8.8 (L) 01/18/2021 1038   GFRNONAA >60 01/18/2021 1038     ASSESSMENT AND PLAN 48 y.o. year old female  has a past medical history of Allergies, Arthritis, Asthma, Dysrhythmia, GERD (gastroesophageal reflux disease), History of kidney stones, migraine headaches, Hypothyroid, Overactive bladder, Palpitations, and Pre-diabetes. here with:  Migraine headaches with aura Paresthesias in the upper extremities  - Stop Emgality  - Start Qulipta  60 mg daily - We discussed different abortive medication such as diclofenac however the patient deferred for now. - Try wearing wrist splints.  If not beneficial can consider referral to hand surgeon - Follow-up in 6 months or sooner if needed   Discussed:  There is increased risk for stroke in women with migraine with aura and a contraindication for the combined contraceptive pill for use by women who have migraine with aura. The risk for women with migraine without aura is lower. However other risk factors like smoking are far more likely to increase stroke risk than migraine. There is a recommendation for no smoking and for the use of OCPs without estrogen such as progestogen only pills particularly for women with migraine with aura.Aaron Aas People who have migraine headaches with auras may be 3 times more likely to have a stroke caused by a blood clot, compared to migraine patients who don't see auras. Women who take hormone-replacement therapy may be 30 percent more likely to suffer a clot-based stroke than women not taking medication containing estrogen.     Clem Currier, MSN, NP-C 07/17/2023, 9:34 AM [facility] [address] [place], [place] [address] [phone]

## 2023-07-17 NOTE — Patient Instructions (Addendum)
 Stop Emgality   Start Qulipta  60 mg daily for migraine prevention  Try Wrist splints  If your symptoms worsen or you develop new symptoms please let us  know.

## 2023-07-17 NOTE — Telephone Encounter (Signed)
 Pharmacy Patient Advocate Encounter  Received notification from [facility] that Prior Authorization for Qulipta  60MG  tablets has been APPROVED from 07/17/2023 to 10/17/2023. Ran test claim, Copay is $0. This test claim was processed through [facility]- copay amounts may vary at other pharmacies due to pharmacy/plan contracts, or as the patient moves through the different stages of their insurance plan.   PA #/Case ID/Reference #: PA Case ID #: [id number]

## 2023-07-17 NOTE — Telephone Encounter (Signed)
 Pharmacy Patient Advocate Encounter   Received notification from CoverMyMeds that prior authorization for Qulipta  60MG  tablets is required/requested.   Insurance verification completed.   The patient is insured through [facility] .   Per test claim: PA required; PA submitted to above mentioned insurance via CoverMyMeds Key/confirmation #/EOC [id number] Status is pending

## 2023-07-21 DIAGNOSIS — J3089 Other allergic rhinitis: Secondary | ICD-10-CM | POA: Diagnosis not present

## 2023-07-21 NOTE — Progress Notes (Signed)
VIALS MADE 07/21/23

## 2023-07-22 ENCOUNTER — Ambulatory Visit (INDEPENDENT_AMBULATORY_CARE_PROVIDER_SITE_OTHER): Payer: Self-pay

## 2023-07-22 DIAGNOSIS — J309 Allergic rhinitis, unspecified: Secondary | ICD-10-CM | POA: Diagnosis not present

## 2023-07-22 DIAGNOSIS — J302 Other seasonal allergic rhinitis: Secondary | ICD-10-CM | POA: Diagnosis not present

## 2023-07-28 ENCOUNTER — Encounter: Payer: Self-pay | Admitting: Adult Health

## 2023-07-30 ENCOUNTER — Encounter: Payer: Self-pay | Admitting: Gastroenterology

## 2023-07-30 ENCOUNTER — Ambulatory Visit: Admitting: Gastroenterology

## 2023-07-30 VITALS — BP 119/75 | HR 97 | Temp 98.6°F | Ht 65.0 in | Wt 198.8 lb

## 2023-07-30 DIAGNOSIS — K219 Gastro-esophageal reflux disease without esophagitis: Secondary | ICD-10-CM

## 2023-07-30 DIAGNOSIS — Z8719 Personal history of other diseases of the digestive system: Secondary | ICD-10-CM | POA: Diagnosis not present

## 2023-07-30 DIAGNOSIS — K602 Anal fissure, unspecified: Secondary | ICD-10-CM

## 2023-07-30 DIAGNOSIS — Z860102 Personal history of hyperplastic colon polyps: Secondary | ICD-10-CM

## 2023-07-30 NOTE — Progress Notes (Signed)
 Gastroenterology Office Note     Primary Care Physician:  Lauran Pollard, MD  Primary Gastroenterologist: Dr. Mordechai April    Chief Complaint   Chief Complaint  Patient presents with   Follow-up    Follow up on anal fissure. Pt states she is better but it took awhile     History of Present Illness   Kendra Soto is a 48 y.o. female presenting today with a history of GERD and dysphagia, s/p EGD with dilation in Dec 2024, last seen in April 2025 with likely anal fissure, returning for early interval follow-up.  She was prescribed a compounded cream from [facility] with diltiazem.   Took cream for 2 weeks and had yeast like irritation so stopped. Started with one fiber gummy at night. Increased to 2 at night and had significant looser stool next day. Backed down to one fiber gummy a day. Still not as effective each day. Will start taking BID instead of both at night. No rectal bleeding. Doing best not to strain. Not needing Miralax of stool softener. The fiber supplement has a probiotic supplement.    GERD: taking pantoprazole  once daily. Previously had been on BID. Now doing well on once daily. Had some symptoms of LPR and did well when BID dosing of PPI. No abdominal pain.   EGD Dec 2024: gastritis s/p biopsy, normal duodenum. Negative H.pylori    Screening colonoscopy 12/03/2021 internal hemorrhoids, diverticulosis of the sigmoid colon, 4 mm polyp removed from the sigmoid colon. Benign hyperplastic. 10-year recall given.     Past Medical History:  Diagnosis Date   Allergies    Arthritis    Asthma    Dysrhythmia    GERD (gastroesophageal reflux disease)    History of kidney stones    Hx of migraine headaches    Hypothyroid    Overactive bladder    Palpitations    Pre-diabetes     Past Surgical History:  Procedure Laterality Date   BALLOON DILATION N/A 03/06/2023   Procedure: BALLOON DILATION;  Surgeon: Vinetta Greening, DO;  [facility];  Service:  Endoscopy;  Laterality: N/A;   BIOPSY  03/06/2023   Procedure: BIOPSY;  Surgeon: Vinetta Greening, DO;  [facility]: [facility];  Service: Endoscopy;;   CHOLECYSTECTOMY  2016   COLONOSCOPY WITH PROPOFOL  N/A 12/03/2021   Procedure: COLONOSCOPY WITH PROPOFOL ;  Surgeon: Vinetta Greening, DO;  [facility]: [facility];  Service: Endoscopy;  Laterality: N/A;  10:30am, asa 2   ESOPHAGOGASTRODUODENOSCOPY N/A 04/30/2018   normal. Empiric dilatation   ESOPHAGOGASTRODUODENOSCOPY (EGD) WITH PROPOFOL  N/A 03/06/2023   Procedure: ESOPHAGOGASTRODUODENOSCOPY (EGD) WITH PROPOFOL ;  Surgeon: Vinetta Greening, DO;  [facility];  Service: Endoscopy;  Laterality: N/A;  900am, asa 2   MALONEY DILATION N/A 04/30/2018   Procedure: MALONEY DILATION;  Surgeon: Suzette Espy, MD;  Location: [facility];  Service: Endoscopy;  Laterality: N/A;   POLYPECTOMY  12/03/2021   Procedure: POLYPECTOMY;  Surgeon: Vinetta Greening, DO;  [facility]: [facility];  Service: Endoscopy;;   TUBAL LIGATION     VAGINAL HYSTERECTOMY N/A 01/22/2021   Procedure: HYSTERECTOMY VAGINAL;  Surgeon: Ozan, Jennifer, DO;  Location: [facility];  Service: Gynecology;  Laterality: N/A;   WISDOM TOOTH EXTRACTION      Current Outpatient Medications  Medication Sig Dispense Refill   albuterol  (VENTOLIN  HFA) 108 (90 Base) MCG/ACT inhaler Inhale 1-2 puffs into the lungs every 6 (six) hours as needed for wheezing  or shortness of breath. 8.5 g 1   EPINEPHrine  (EPIPEN  2-PAK) 0.3 mg/0.3 mL IJ SOAJ injection Inject 0.3 mg into the muscle as needed for anaphylaxis. 2 each 1   flecainide  (TAMBOCOR ) 50 MG tablet TAKE 1 TABLET BY MOUTH TWICE DAILY 180 tablet 3   fluticasone -salmeterol (WIXELA INHUB) 500-50 MCG/ACT AEPB Inhale 1 puff into the lungs 2 (two) times daily. in the morning and at bedtime. 60 each 5   GEMTESA  75 MG TABS Take 1 tablet by mouth daily.     ipratropium (ATROVENT ) 0.03 % nasal spray Place 1 spray into both nostrils 3 (three)  times daily as needed for rhinitis. 30 mL 5   levocetirizine (XYZAL ) 5 MG tablet TAKE 1 TABLET BY MOUTH DAILY AS NEEDED FOR ALLERGIES (CAN TAKE AN EXTRA DOSE DURING FLARE UPS.). 180 tablet 1   levothyroxine (SYNTHROID) 75 MCG tablet Take 75 mcg by mouth daily before breakfast.     montelukast  (SINGULAIR ) 10 MG tablet Take 1 tablet (10 mg total) by mouth at bedtime. 90 tablet 1   pantoprazole  (PROTONIX ) 40 MG tablet Take 1 tablet (40 mg total) by mouth 2 (two) times daily. (Patient taking differently: Take 40 mg by mouth daily.) 60 tablet 11   Spacer/Aero-Holding Chambers DEVI 1 Device by Does not apply route as directed. 1 each 1   SPIRIVA  RESPIMAT 1.25 MCG/ACT AERS Inhale 2 Inhalations into the lungs daily. 4 g 5   Atogepant  (QULIPTA ) 60 MG TABS Take 1 tablet (60 mg total) by mouth daily. (Patient not taking: Reported on 07/30/2023) 30 tablet 5   clobetasol ointment (TEMOVATE) 0.05 % Apply 1 Application topically 2 (two) times daily as needed (irritation). (Patient not taking: Reported on 07/30/2023)     Galcanezumab -gnlm (EMGALITY ) 120 MG/ML SOAJ Inject 120 mg into the skin every 30 (thirty) days. (Patient not taking: Reported on 07/30/2023) 1 mL 11   No current facility-administered medications for this visit.    Allergies as of 07/30/2023 - Review Complete 07/30/2023  Allergen Reaction Noted   Bee pollen Cough, Other (See Comments), and Shortness Of Breath 06/07/2021   Short ragweed pollen ext Cough, Other (See Comments), and Shortness Of Breath 06/07/2021    Family History  Problem Relation Age of Onset   Breast cancer Mother    Hypertension Mother    Migraines Mother    Allergic rhinitis Father    Cancer Father        prostate   Hypertension Father    Migraines Sister    Migraines Sister    Rheum arthritis Sister    Migraines Daughter    Headache Daughter    Allergic rhinitis Brother    Headache Son    Colon cancer Neg Hx    Celiac disease Neg Hx    Crohn's disease Neg Hx     Colon polyps Neg Hx     Social History   Socioeconomic History   Marital status: Married    Spouse name: Not on file   Number of children: 3   Years of education: Associates-15   Highest education level: Not on file  Occupational History   Not on file  Tobacco Use   Smoking status: Former    Current packs/day: 0.00    Average packs/day: 1 pack/day for 20.0 years (20.0 ttl pk-yrs)    Types: Cigarettes    Start date: 9    Quit date: 2009    Years since quitting: 16.3   Smokeless tobacco: Never  Vaping Use  Vaping status: Never Used  Substance and Sexual Activity   Alcohol use: No   Drug use: No   Sexual activity: Yes    Birth control/protection: Surgical    Comment: hyst  Other Topics Concern   Not on file  Social History Narrative   Caffeine: max of 1 cup in a day, mostly avoids    Right handed    Social Drivers of Health   Financial Resource Strain: High Risk (03/05/2022)   Overall Financial Resource Strain (CARDIA)    Difficulty of Paying Living Expenses: Hard  Food Insecurity: Food Insecurity Present (03/05/2022)   Hunger Vital Sign    Worried About Running Out of Food in the Last Year: Sometimes true    Ran Out of Food in the Last Year: Never true  Transportation Needs: No Transportation Needs (03/05/2022)   PRAPARE - Administrator, Civil Service (Medical): No    Lack of Transportation (Non-Medical): No  Physical Activity: Inactive (03/05/2022)   Exercise Vital Sign    Days of Exercise per Week: 0 days    Minutes of Exercise per Session: 0 min  Stress: Stress Concern Present (03/05/2022)   [employer] of Occupational Health - Occupational Stress Questionnaire    Feeling of Stress : Rather much  Social Connections: Moderately Integrated (03/05/2022)   Social Connection and Isolation Panel [NHANES]    Frequency of Communication with Friends and Family: More than three times a week    Frequency of Social Gatherings with Friends and  Family: Twice a week    Attends Religious Services: More than 4 times per year    Active Member of [employer] or Organizations: No    Attends Banker Meetings: Never    Marital Status: Married  Catering manager Violence: Not At Risk (03/05/2022)   Humiliation, Afraid, Rape, and Kick questionnaire    Fear of Current or Ex-Partner: No    Emotionally Abused: No    Physically Abused: No    Sexually Abused: No     Review of Systems   Gen: Denies any fever, chills, fatigue, weight loss, lack of appetite.  CV: Denies chest pain, heart palpitations, peripheral edema, syncope.  Resp: Denies shortness of breath at rest or with exertion. Denies wheezing or cough.  GI: Denies dysphagia or odynophagia. Denies jaundice, hematemesis, fecal incontinence. GU : Denies urinary burning, urinary frequency, urinary hesitancy MS: Denies joint pain, muscle weakness, cramps, or limitation of movement.  Derm: Denies rash, itching, dry skin Psych: Denies depression, anxiety, memory loss, and confusion Heme: Denies bruising, bleeding, and enlarged lymph nodes.   Physical Exam   BP 119/75   Pulse 97   Temp 98.6 F (37 C)   Ht 5\' 5"  (1.651 m)   Wt 198 lb 12.8 oz (90.2 kg)   LMP 01/22/2021 Comment: Pregnancy test negative on 01/18/2021  BMI 33.08 kg/m  General:   Alert and oriented. Pleasant and cooperative. Well-nourished and well-developed.  Head:  Normocephalic and atraumatic. Eyes:  Without icterus Abdomen:  +BS, soft, non-tender and non-distended. No HSM noted. No guarding or rebound. No masses appreciated. Pinpoint tenderness LUQ chronic but no mass, lesion, likely nerve-related.  Rectal:  Deferred  Msk:  Symmetrical without gross deformities. Normal posture. Extremities:  Without edema. Neurologic:  Alert and  oriented x4;  grossly normal neurologically. Skin:  Intact without significant lesions or rashes. Psych:  Alert and cooperative. Normal mood and affect.   Assessment   Kendra Soto is a 48 y.o.  female presenting today with a history of  GERD and dysphagia, s/p EGD with dilation in Dec 2024, last seen in April 2025 with likely anal fissure, returning for early interval follow-up.  Anal fissure: improved s/p brief course of compounded cream with diltiazem. Therapy shortened due to yeast-like reaction that has now resolved. Symptoms abated. If needs further therapy, will only do diltiazem instead of steroid cream with it as likely this was culprit for reaction but unable to exclude other ingredients. Continue with fiber, avoiding straining, limiting toilet time.   GERD: continue on once daily PPI as controlling symptoms. 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 If worsening, could do BID therapy for a time but will keep at lowest dose controlling symptoms.    Screening colonoscopy 12/03/2021 internal hemorrhoids, diverticulosis of the sigmoid colon, 4 mm polyp removed from the sigmoid colon. Benign hyperplastic. 10-year recall given.    PLAN    Continue fiber supplementation, avoiding straining, limit toilet time, call if constipation or recurrent symptoms PPI daily, keep lowest dose that treats symptoms Return in 1 year or sooner if needed Next colonoscopy 2033   Delman Ferns, PhD, ANP-BC [facility]

## 2023-07-30 NOTE — Patient Instructions (Signed)
 I am glad you are doing well!  Continue titrating the fiber as you are doing.  Continue the pantoprazole  once daily, and let us  know if any flares of reflux that you have.  We will see you in 1 year or sooner if needed!  I enjoyed seeing you again today! I value our relationship and want to provide genuine, compassionate, and quality care. You may receive a survey regarding your visit with me, and I welcome your feedback! Thanks so much for taking the time to complete this. I look forward to seeing you again.      Delman Ferns, PhD, ANP-BC [facility]

## 2023-08-05 ENCOUNTER — Ambulatory Visit (INDEPENDENT_AMBULATORY_CARE_PROVIDER_SITE_OTHER)

## 2023-08-05 DIAGNOSIS — J309 Allergic rhinitis, unspecified: Secondary | ICD-10-CM

## 2023-08-19 ENCOUNTER — Ambulatory Visit (INDEPENDENT_AMBULATORY_CARE_PROVIDER_SITE_OTHER): Payer: Self-pay

## 2023-08-19 DIAGNOSIS — J309 Allergic rhinitis, unspecified: Secondary | ICD-10-CM

## 2023-09-02 ENCOUNTER — Ambulatory Visit (INDEPENDENT_AMBULATORY_CARE_PROVIDER_SITE_OTHER): Payer: Self-pay

## 2023-09-02 DIAGNOSIS — J309 Allergic rhinitis, unspecified: Secondary | ICD-10-CM | POA: Diagnosis not present

## 2023-09-09 ENCOUNTER — Ambulatory Visit (INDEPENDENT_AMBULATORY_CARE_PROVIDER_SITE_OTHER): Payer: Self-pay

## 2023-09-09 DIAGNOSIS — J309 Allergic rhinitis, unspecified: Secondary | ICD-10-CM | POA: Diagnosis not present

## 2023-09-16 ENCOUNTER — Ambulatory Visit (INDEPENDENT_AMBULATORY_CARE_PROVIDER_SITE_OTHER): Payer: Self-pay

## 2023-09-16 DIAGNOSIS — J309 Allergic rhinitis, unspecified: Secondary | ICD-10-CM

## 2023-10-21 ENCOUNTER — Ambulatory Visit (INDEPENDENT_AMBULATORY_CARE_PROVIDER_SITE_OTHER): Payer: Self-pay

## 2023-10-21 DIAGNOSIS — J309 Allergic rhinitis, unspecified: Secondary | ICD-10-CM | POA: Diagnosis not present

## 2023-10-22 ENCOUNTER — Other Ambulatory Visit (HOSPITAL_COMMUNITY): Payer: Self-pay

## 2023-10-22 ENCOUNTER — Telehealth: Payer: Self-pay

## 2023-10-22 NOTE — Telephone Encounter (Signed)
 It is time to renew PA-pt has not been evaluated since starting the medication and insurance is asking for documentation to the following question below. Please advise.

## 2023-10-23 NOTE — Telephone Encounter (Signed)
Kendra Soto

## 2023-10-26 NOTE — Telephone Encounter (Signed)
 I called pt and she said that she has been taking qulipta  for about 3 months and has noted a decrease in the # of migraines.  She is doing well, still has some breakthru but most definitely helping.  I told her will send info. To PA team.

## 2023-10-27 ENCOUNTER — Encounter (HOSPITAL_COMMUNITY): Payer: Self-pay | Admitting: Internal Medicine

## 2023-10-27 ENCOUNTER — Other Ambulatory Visit (HOSPITAL_COMMUNITY): Payer: Self-pay

## 2023-10-27 NOTE — Telephone Encounter (Signed)
 Pharmacy Patient Advocate Encounter  Received notification from [facility] that Prior Authorization for Qulipta  60mg   has been APPROVED from 10/27/2023 to 10/26/2024. Ran test claim, Copay is $0. This test claim was processed through [facility]- copay amounts may vary at other pharmacies due to pharmacy/plan contracts, or as the patient moves through the different stages of their insurance plan.   PA #/Case ID/Reference #: [id number]

## 2023-10-27 NOTE — Telephone Encounter (Signed)
 Pharmacy Patient Advocate Encounter   Received notification from CoverMyMeds that prior authorization for Qulipta  60mg  Tablets is required/requested.   Insurance verification completed.   The patient is insured through [facility] .   Per test claim: PA required; PA submitted to above mentioned insurance via Latent Key/confirmation #/EOC [id number] Status is pending

## 2023-10-28 ENCOUNTER — Other Ambulatory Visit (HOSPITAL_COMMUNITY): Payer: Self-pay | Admitting: Internal Medicine

## 2023-10-28 DIAGNOSIS — R921 Mammographic calcification found on diagnostic imaging of breast: Secondary | ICD-10-CM

## 2023-11-08 ENCOUNTER — Other Ambulatory Visit: Payer: Self-pay | Admitting: Cardiology

## 2023-11-23 ENCOUNTER — Encounter: Payer: Self-pay | Admitting: Internal Medicine

## 2023-11-23 ENCOUNTER — Ambulatory Visit: Payer: 59 | Admitting: Internal Medicine

## 2023-11-23 VITALS — BP 108/70 | HR 88 | Temp 97.9°F | Resp 14 | Ht 64.57 in | Wt 200.0 lb

## 2023-11-23 DIAGNOSIS — J302 Other seasonal allergic rhinitis: Secondary | ICD-10-CM

## 2023-11-23 DIAGNOSIS — J3089 Other allergic rhinitis: Secondary | ICD-10-CM

## 2023-11-23 DIAGNOSIS — J4540 Moderate persistent asthma, uncomplicated: Secondary | ICD-10-CM | POA: Diagnosis not present

## 2023-11-23 MED ORDER — EPINEPHRINE 0.3 MG/0.3ML IJ SOAJ: 0.3000 mg | INTRAMUSCULAR | 1 refills | Status: AC | PRN

## 2023-11-23 MED ORDER — FLUTICASONE-SALMETEROL 500-50 MCG/ACT IN AEPB: 1.0000 | INHALATION_SPRAY | Freq: Two times a day (BID) | RESPIRATORY_TRACT | 5 refills | Status: AC

## 2023-11-23 MED ORDER — LEVOCETIRIZINE DIHYDROCHLORIDE 5 MG PO TABS: 5.0000 mg | ORAL_TABLET | Freq: Every day | ORAL | 1 refills | Status: AC | PRN

## 2023-11-23 MED ORDER — IPRATROPIUM BROMIDE 0.03 % NA SOLN: 1.0000 | Freq: Three times a day (TID) | NASAL | 5 refills | Status: AC | PRN

## 2023-11-23 MED ORDER — ALBUTEROL SULFATE HFA 108 (90 BASE) MCG/ACT IN AERS: 1.0000 | INHALATION_SPRAY | Freq: Four times a day (QID) | RESPIRATORY_TRACT | 1 refills | Status: AC | PRN

## 2023-11-23 MED ORDER — MONTELUKAST SODIUM 10 MG PO TABS: 10.0000 mg | ORAL_TABLET | Freq: Every day | ORAL | 1 refills | Status: AC

## 2023-11-23 NOTE — Patient Instructions (Addendum)
 Moderate persistent asthma - Daily controller medication(s):  Increase to Wixela 500/52mcg 1 puff twice daily  Stop Spiriva  Continue Singulair  10mg  daily.  - Rescue medications: Albuterol  2 puffs every 4-6 hours as needed for cough, wheeze, shortness of breath. - Asthma control goals:  * Full participation in all desired activities (may need albuterol  before activity) * Albuterol  use two time or less a week on average (not counting use with activity) * Cough interfering with sleep two time or less a month * Oral steroids no more than once a year * No hospitalizations  Allergic rhinitis (grasses, trees, indoor molds, outdoor molds, dust mites, and cat) - Use nasal saline rinses before nose sprays such as with Neilmed Sinus Rinse.  Use distilled water .  - Use Azelastine  1-2 sprays each nostril twice daily as needed for runny nose, drainage, sneezing, congestion. Aim upward and outward. - Use Ipratroprium 1-2 sprays up to four times daily as needed for runny nose. Aim upward and outward. Be careful as this can be drying.  - Use Xyzal  5mg  daily as needed for runny nose, sneezing, itchy watery eyes.  On shot days, take Xyzal  5mg  daily.   - Use Singulair  10mg  daily. Stop if there are any mood/behavioral changes. - For eyes, use Olopatadine or Ketotifen 1 eye drop daily as needed for itchy, watery eyes.  Available over the counter, if not covered by insurance.  - Continue allergy shots on schedule.  AIT initiated 09/2022, reached red vial 12/2022. Keep Epipen .

## 2023-11-23 NOTE — Progress Notes (Signed)
 FOLLOW UP Date of Service/Encounter:  11/23/23   Subjective:  Kendra Soto (DOB: [birth year]) is a 48 y.o. female who [facility] for follow up for asthma and allergic rhinitis.   History obtained from: chart review and patient. Last visit was with me on 05/04/2023  Asthma- Controlled on Wixela 1 puff daily + Spiriva  Allergies on AIT, Ipratroprium, Azelastine , Singulair , Xyzal   Reports asthma has done well.  No Albuterol  use since last visit.  Using Wixela 1 puff daily, Spiriva  daily and Singulair  daily.  No ER/urgent care/oral prednisone.  Generally flares in Fall/Winter though.    Does note on and off runny nose.  Using Atrovent  spray daily, PRN Xyzal /Azelastine  and Singulair  daily.  Doing well with AIT. Has an Epipen .     Past Medical History: Past Medical History:  Diagnosis Date   Allergies    Arthritis    Asthma    Dysrhythmia    GERD (gastroesophageal reflux disease)    History of kidney stones    Hx of migraine headaches    Hypothyroid    Overactive bladder    Palpitations    Pre-diabetes     Objective:  BP 108/70   Pulse 88   Temp 97.9 F (36.6 C)   Resp 14   Ht 5' 4.57 (1.64 m)   Wt 200 lb (90.7 kg)   LMP 01/22/2021 Comment: Pregnancy test negative on 01/18/2021  SpO2 97%   BMI 33.73 kg/m  Body mass index is 33.73 kg/m. Physical Exam: GEN: alert, well developed HEENT: clear conjunctiva, nose with mild inferior turbinate hypertrophy, pink nasal mucosa, + clear rhinorrhea, slight cobblestoning HEART: regular rate and rhythm, no murmur LUNGS: clear to auscultation bilaterally, no coughing, unlabored respiration SKIN: no rashes or lesions  Spirometry:  Tracings reviewed. Her effort: Good reproducible efforts. FVC: 3.63L, 104% predicted  FEV1: 3.07L, 110% predicted FEV1/FVC ratio: 85% Interpretation: Spirometry consistent with normal pattern.  Please see scanned spirometry results for details.  Assessment:    1. Seasonal and perennial allergic rhinitis   2. Moderate persistent asthma, uncomplicated     Plan/Recommendations:   Moderate persistent asthma - Well controlled with normal spirometry.  Will try to de-escalate therapy with stopping Spiriva .  Also generally worsens in Fall/Winter so we will try to avoid that by using Wixela BID as prescribed rather than daily.  - Daily controller medication(s):  Use Wixela 500/50mcg 1 puff twice daily and Singulair  10mg  daily. Stop Spiriva  - Rescue medications: Albuterol  2 puffs every 4-6 hours as needed for cough, wheeze, shortness of breath. - Asthma control goals:  * Full participation in all desired activities (may need albuterol  before activity) * Albuterol  use two time or less a week on average (not counting use with activity) * Cough interfering with sleep two time or less a month * Oral steroids no more than once a year * No hospitalizations  Allergic rhinitis (grasses, trees, indoor molds, outdoor molds, dust mites, and cat) - Improved, continue AIT.   - Use nasal saline rinses before nose sprays such as with Neilmed Sinus Rinse.  Use distilled water .  - Use Azelastine  1-2 sprays each nostril twice daily as needed for runny nose, drainage, sneezing, congestion. Aim upward and outward. - Use Ipratroprium 1-2 sprays up to four times daily as needed for runny nose. Aim upward and outward. Be careful as this can be drying.  - Use Xyzal  5mg  daily as needed for runny nose, sneezing, itchy watery eyes.  On shot days, take Xyzal  5mg  daily.   - Use Singulair  10mg  daily. Stop if there are any mood/behavioral changes. - For eyes, use Olopatadine or Ketotifen 1 eye drop daily as needed for itchy, watery eyes.  Available over the counter, if not covered by insurance.  - Continue allergy shots on schedule.  AIT initiated 09/2022, reached red vial 12/2022. Keep Epipen .    Return in about 6 months (around 05/22/2024).  Arleta Blanch, MD [facility] 

## 2023-12-07 ENCOUNTER — Other Ambulatory Visit: Payer: Self-pay | Admitting: Internal Medicine

## 2023-12-15 ENCOUNTER — Ambulatory Visit (HOSPITAL_COMMUNITY)
Admission: RE | Admit: 2023-12-15 | Discharge: 2023-12-15 | Disposition: A | Source: Ambulatory Visit | Attending: Internal Medicine | Admitting: Internal Medicine

## 2023-12-15 ENCOUNTER — Other Ambulatory Visit: Payer: Self-pay | Admitting: Internal Medicine

## 2023-12-15 ENCOUNTER — Ambulatory Visit (HOSPITAL_COMMUNITY)
Admission: RE | Admit: 2023-12-15 | Discharge: 2023-12-15 | Disposition: A | Discharge status: Home / Self Care | Source: Ambulatory Visit | Attending: Internal Medicine | Admitting: Internal Medicine

## 2023-12-15 DIAGNOSIS — R921 Mammographic calcification found on diagnostic imaging of breast: Secondary | ICD-10-CM

## 2023-12-16 ENCOUNTER — Ambulatory Visit: Payer: Self-pay

## 2023-12-16 DIAGNOSIS — J309 Allergic rhinitis, unspecified: Secondary | ICD-10-CM

## 2023-12-29 DIAGNOSIS — J3081 Allergic rhinitis due to animal (cat) (dog) hair and dander: Secondary | ICD-10-CM | POA: Diagnosis not present

## 2023-12-29 DIAGNOSIS — J302 Other seasonal allergic rhinitis: Secondary | ICD-10-CM | POA: Diagnosis not present

## 2023-12-29 DIAGNOSIS — J3089 Other allergic rhinitis: Secondary | ICD-10-CM | POA: Diagnosis not present

## 2023-12-29 NOTE — Progress Notes (Signed)
 VIALS MADE ON 12/29/23

## 2024-01-19 ENCOUNTER — Other Ambulatory Visit: Payer: Self-pay | Admitting: Adult Health

## 2024-01-19 NOTE — Telephone Encounter (Signed)
Last refill by historical provider  Please advise

## 2024-01-20 ENCOUNTER — Ambulatory Visit (INDEPENDENT_AMBULATORY_CARE_PROVIDER_SITE_OTHER): Payer: Self-pay

## 2024-01-20 DIAGNOSIS — J309 Allergic rhinitis, unspecified: Secondary | ICD-10-CM

## 2024-02-17 ENCOUNTER — Ambulatory Visit

## 2024-02-17 DIAGNOSIS — J309 Allergic rhinitis, unspecified: Secondary | ICD-10-CM | POA: Diagnosis not present

## 2024-02-24 ENCOUNTER — Ambulatory Visit: Admitting: Adult Health

## 2024-02-24 ENCOUNTER — Telehealth: Payer: Self-pay | Admitting: Pharmacist

## 2024-02-24 ENCOUNTER — Encounter: Payer: Self-pay | Admitting: Adult Health

## 2024-02-24 VITALS — BP 113/70 | HR 81 | Ht 64.5 in | Wt 205.6 lb

## 2024-02-24 DIAGNOSIS — G43101 Migraine with aura, not intractable, with status migrainosus: Secondary | ICD-10-CM

## 2024-02-24 MED ORDER — QULIPTA 60 MG PO TABS: 1.0000 | ORAL_TABLET | Freq: Every day | ORAL | 11 refills | Status: AC

## 2024-02-24 MED ORDER — NURTEC 75 MG PO TBDP: ORAL_TABLET | ORAL | 11 refills | Status: AC

## 2024-02-24 NOTE — Telephone Encounter (Signed)
 Pharmacy Patient Advocate Encounter  Received notification from [facility] that Prior Authorization for Nurtec 75MG  dispersible tablets has been APPROVED from 02/24/2024 to 02/23/2025   PA #/Case ID/Reference #: [id number]

## 2024-02-24 NOTE — Telephone Encounter (Signed)
 Clinical questions have been answered and PA submitted. PA currently Pending. Please be advised that most companies allow up to 30 days to make a decision. We will advise when a determination has been made, or follow up in 1 week.   Please reach out to our team, Rx Prior Auth Pool, if you haven't heard back in a week.

## 2024-02-24 NOTE — Progress Notes (Signed)
 PATIENT: Kendra Soto DOB: 11-12-1975  REASON FOR VISIT: follow up HISTORY FROM: patient   Chief Complaint  Patient presents with   RM 4     Patient is here alone for migraines follow-up -  end of November was getting more frequent headaches. Would like to discuss increase her medication      HISTORY OF PRESENT ILLNESS: Today 02/24/24:  Kendra Soto is a 48 y.o. female with a history of migraine headaches and paresthesias in the upper extremities. Returns today for follow-up.  She reports that overall her migraines have been under good control.  She states that the end of November they have increased in frequency.  Continues on Qulipta  60 mg daily.  She does get an aura with her migraines but not with every migraine.  She was using Nurtec but no longer has this.  She has been using Excedrin Migraine.  She has noticed that once her migraine is improving she gets some stomach pain but this eventually resolves.  She returns today for an evaluation.   07/17/23: Kendra Soto is a 48 y.o. female with a history of migraine headaches and paresthesias in the upper extremities. Returns today for follow-up.  Patient is now on Emgality .  Insurance would no longer approve Aimovig .  She states that she is getting approximately 4-5 migraines a month.  She does feel that Aimovig  worked better but insurance would not approve.  We also tried to get her on Qulipta  in the past but insurance would not approve.  She is open to trying a different medication if we feel that it may work better. she is using Excedrin Migraine.  Has tried Nurtec Ubrelvy and several triptans.  Reports that Nurtec and Ubrelvy did not offer any benefit.  The triptan medication caused significant side effects.  She does get an aura with some of her migraines.  Reports that she still having paresthesias in the upper extremities.  She has already had nerve conduction studies with EMG that showed early mild carpal tunnel syndrome.  Encouraged  her to wear wrist splints.  She returns today for an evaluation.    08/12/22: Kendra Soto is a 48 y.o. female with a history of migraine headaches and paresthesias in the upper extremities.. Returns today for follow-up.  She reports that her headache frequency has improved.  She has approximately 1 headache weekly.  Her frequency of headaches increase the week before her next injection is due.  She remains on Aimovig  140 mg monthly.  She takes Nurtec for abortive therapy.  She reports that it does not work consistently.  However she does not want to retry a triptan due to potential side effects.  Continues to have Numbness in both arms- notices when they are bent or when she is driving. Feels weaker in the UEs. Arms get tired quickly.  She states when she was at her previous neurologist she had nerve conduction studies (this was several years ago).  Reports that she was told then that she may have the beginning of carpal tunnel syndrome.  She did have MRI of the cervical spine which was unremarkable.   02/03/22: Kendra Soto is a 48 y.o. female who has been followed in this office for migraine headaches.. Returns today for follow-up.  She was originally prescribed Qulipta  however her insurance would not cover this.  She is now on Aimovig  70 mg monthly injection.  Reports that it has helped. But the last 1.5 weeks has been getting a headache daily.  Left side and starts at the base of the neck and moves to the front. Some nausea and vomiting with severe headaches. Photophobia and phonophobia. Reports sister has a history of chiari malformation.  Patient was sent to physical therapy for neck pain and paresthesias radiating down both arms.  She reports that it has persisted. Some improvement with ROM. Also notices a tremor in both arms with tedious activities such as eating, tying shoes. No family history of tremor.   Insurance only approved MRI of the brain did not approve cervical.  HISTORY Medical  co-morbidities: asthma, hypothyroidism, prediabetes, PVCs, kidney stones   The patient presents for evaluation of headaches which began in childhood but have been worsening recently. She has had headaches daily for the past weeks. No clear onset for worsening of her headaches. Headaches are described as sharp or throbbing pain from her left eye radiating to her occiput. They are associated with photophobia, phonophobia, nausea, and vomiting. Sometimes gets paresthesias on the left side of her scalp as well.    She has noticed significant neck pain, and headaches can be triggered by turning her neck L>R. Pain will radiate from her neck down her arms on both sides. Arms will go numb when she is driving and has them resting on the steering wheel. Feels her grip strength has worsened bilaterally and has been dropping things more frequently.    Emily Newer and Excedrin as needed. Ubrelvy 100 mg helps some of the time and she almost always has to take a second dose. It takes several hours to start working.   She is currently undergoing workup for arrhythmias. Is planned to wear a heart monitor for the next 2 weeks.   Headache History: Onset: childhood Triggers: turning her neck L>R, salty food Aura: sparkles (rare) Location: left side, occiput, retro-orbital Quality/Description: sharp, throbbing Associated Symptoms:             Photophobia: yes             Phonophobia: yes             Nausea: yes Vomiting: yes Worse with activity?: yes Duration of headaches: up to 24 hours   Headache days per month: 21 Headache free days per month: 9   Current Treatment: Abortive Ubrelvy 100 mg PRN Excedrin   Preventative none   Prior Therapies                                 Metoprolol Topamax - contraindicated due to kidney stones Excedrin Imitrex 100 mg PRN - helped but caused neck and jaw pain Maxalt - lack of efficacy Ubrelvy 100 mg PRN    REVIEW OF SYSTEMS: Out of a complete 14 system  review of symptoms, the patient complains only of the following symptoms, and all other reviewed systems are negative.  ALLERGIES: Allergies  Allergen Reactions   Bee Pollen Cough, Other (See Comments) and Shortness Of Breath   Short Ragweed Pollen Ext Cough, Other (See Comments) and Shortness Of Breath    HOME MEDICATIONS: Outpatient Medications Prior to Visit  Medication Sig Dispense Refill   albuterol  (VENTOLIN  HFA) 108 (90 Base) MCG/ACT inhaler Inhale 1-2 puffs into the lungs every 6 (six) hours as needed for wheezing or shortness of breath. 8.5 g 1   EPINEPHrine  (EPIPEN  2-PAK) 0.3 mg/0.3 mL IJ SOAJ injection Inject 0.3 mg into the muscle as needed for anaphylaxis. 2 each 1  flecainide  (TAMBOCOR ) 50 MG tablet TAKE 1 TABLET BY MOUTH TWICE DAILY 180 tablet 2   fluticasone -salmeterol (WIXELA INHUB) 500-50 MCG/ACT AEPB Inhale 1 puff into the lungs 2 (two) times daily. in the morning and at bedtime. 60 each 5   GEMTESA  75 MG TABS Take 1 tablet by mouth daily.     ipratropium (ATROVENT ) 0.03 % nasal spray Place 1 spray into both nostrils 3 (three) times daily as needed for rhinitis. 30 mL 5   levocetirizine (XYZAL ) 5 MG tablet Take 1 tablet (5 mg total) by mouth daily as needed for allergies. 90 tablet 1   levothyroxine (SYNTHROID) 75 MCG tablet Take 75 mcg by mouth daily before breakfast.     montelukast  (SINGULAIR ) 10 MG tablet Take 1 tablet (10 mg total) by mouth at bedtime. 90 tablet 1   pantoprazole  (PROTONIX ) 40 MG tablet Take 1 tablet (40 mg total) by mouth 2 (two) times daily. (Patient taking differently: Take 40 mg by mouth daily.) 60 tablet 11   QULIPTA  60 MG TABS TAKE 1 TABLET BY MOUTH DAILY 30 tablet 5   Spacer/Aero-Holding Chambers DEVI 1 Device by Does not apply route as directed. 1 each 1   SPIRIVA  RESPIMAT 1.25 MCG/ACT AERS INHALE TWO PUFFS INTO THE LUNGS DAILY 4 g 5   No facility-administered medications prior to visit.    PAST MEDICAL HISTORY: Past Medical History:   Diagnosis Date   Allergies    Arthritis    Asthma    Dysrhythmia    GERD (gastroesophageal reflux disease)    History of kidney stones    Hx of migraine headaches    Hypothyroid    Overactive bladder    Palpitations    Pre-diabetes     PAST SURGICAL HISTORY: Past Surgical History:  Procedure Laterality Date   BALLOON DILATION N/A 03/06/2023   Procedure: BALLOON DILATION;  Surgeon: Cindie Carlin POUR, DO;  [facility]: [facility];  Service: Endoscopy;  Laterality: N/A;   BIOPSY  03/06/2023   Procedure: BIOPSY;  Surgeon: Cindie Carlin POUR, DO;  Location: [facility];  Service: Endoscopy;;   CHOLECYSTECTOMY  2016   COLONOSCOPY WITH PROPOFOL  N/A 12/03/2021   Procedure: COLONOSCOPY WITH PROPOFOL ;  Surgeon: Cindie Carlin POUR, DO;  Location: [facility];  Service: Endoscopy;  Laterality: N/A;  10:30am, asa 2   ESOPHAGOGASTRODUODENOSCOPY N/A 04/30/2018   normal. Empiric dilatation   ESOPHAGOGASTRODUODENOSCOPY (EGD) WITH PROPOFOL  N/A 03/06/2023   Procedure: ESOPHAGOGASTRODUODENOSCOPY (EGD) WITH PROPOFOL ;  Surgeon: Cindie Carlin POUR, DO;  Location: [facility];  Service: Endoscopy;  Laterality: N/A;  900am, asa 2   MALONEY DILATION N/A 04/30/2018   Procedure: MALONEY DILATION;  Surgeon: Shaaron Lamar HERO, MD;  Location: [facility];  Service: Endoscopy;  Laterality: N/A;   POLYPECTOMY  12/03/2021   Procedure: POLYPECTOMY;  Surgeon: Cindie Carlin POUR, DO;  Location: [facility];  Service: Endoscopy;;   TUBAL LIGATION     VAGINAL HYSTERECTOMY N/A 01/22/2021   Procedure: HYSTERECTOMY VAGINAL;  Surgeon: Ozan, Jennifer, DO;  Location: [facility];  Service: Gynecology;  Laterality: N/A;   WISDOM TOOTH EXTRACTION      FAMILY HISTORY: Family History  Problem Relation Age of Onset   Breast cancer Mother    Hypertension Mother    Migraines Mother    Allergic rhinitis Father    Cancer Father        prostate   Hypertension Father    Migraines Sister    Migraines Sister    Rheum  arthritis Sister    Migraines Daughter    Headache Daughter    Allergic rhinitis Brother    Headache Son    Colon cancer Neg Hx    Celiac disease Neg Hx    Crohn's disease Neg Hx    Colon polyps Neg Hx     SOCIAL HISTORY: Social History   Socioeconomic History   Marital status: Married    Spouse name: Not on file   Number of children: 3   Years of education: Associates-15   Highest education level: Not on file  Occupational History   Not on file  Tobacco Use   Smoking status: Former    Current packs/day: 0.00    Average packs/day: 1 pack/day for 20.0 years (20.0 ttl pk-yrs)    Types: Cigarettes    Start date: 66    Quit date: 2009    Years since quitting: 16.9   Smokeless tobacco: Never  Vaping Use   Vaping status: Never Used  Substance and Sexual Activity   Alcohol use: No   Drug use: No   Sexual activity: Yes    Birth control/protection: Surgical    Comment: hyst  Other Topics Concern   Not on file  Social History Narrative   Caffeine: max of 1 cup in a day, mostly avoids    Right handed    Social Drivers of Health   Financial Resource Strain: High Risk (03/05/2022)   Overall Financial Resource Strain (CARDIA)    Difficulty of Paying Living Expenses: Hard  Food Insecurity: Food Insecurity Present (03/05/2022)   Hunger Vital Sign    Worried About Running Out of Food in the Last Year: Sometimes true    Ran Out of Food in the Last Year: Never true  Transportation Needs: No Transportation Needs (03/05/2022)   PRAPARE - Administrator, Civil Service (Medical): No    Lack of Transportation (Non-Medical): No  Physical Activity: Inactive (03/05/2022)   Exercise Vital Sign    Days of Exercise per Week: 0 days    Minutes of Exercise per Session: 0 min  Stress: Stress Concern Present (03/05/2022)   [employer] of Occupational Health - Occupational Stress Questionnaire    Feeling of Stress : Rather much  Social Connections: Moderately  Integrated (03/05/2022)   Social Connection and Isolation Panel    Frequency of Communication with Friends and Family: More than three times a week    Frequency of Social Gatherings with Friends and Family: Twice a week    Attends Religious Services: More than 4 times per year    Active Member of [employer] or Organizations: No    Attends Banker Meetings: Never    Marital Status: Married  Catering Manager Violence: Not At Risk (03/05/2022)   Humiliation, Afraid, Rape, and Kick questionnaire    Fear of Current or Ex-Partner: No    Emotionally Abused: No    Physically Abused: No    Sexually Abused: No      PHYSICAL EXAM  Vitals:   02/24/24 0827  BP: 113/70  Pulse: 81  Weight: 205 lb 9.6 oz (93.3 kg)  Height: 5' 4.5 (1.638 m)     Body mass index is 34.75 kg/m.  Generalized: Well developed, in no acute distress   Neurological examination  Mentation: Alert oriented to time, place, history taking. Follows all commands speech and language fluent Cranial nerve II-XII: Pupils were equal round reactive to light. Extraocular movements were full, visual field were full on confrontational  test. Facial sensation and strength were normal.  Head turning and shoulder shrug  were normal and symmetric. Motor: The motor testing reveals 5 over 5 strength of all 4 extremities. Good symmetric motor tone is noted throughout.  Sensory: Sensory testing is intact to soft touch on all 4 extremities. No evidence of extinction is noted.  Coordination: Cerebellar testing reveals good finger-nose-finger and heel-to-shin bilaterally.  Gait and station: Gait is normal.  Reflexes: Deep tendon reflexes are symmetric and normal bilaterally.   DIAGNOSTIC DATA (LABS, IMAGING, TESTING) - I reviewed patient records, labs, notes, testing and imaging myself where available.  Lab Results  Component Value Date   WBC 10.0 06/10/2021   HGB 12.7 06/10/2021   HCT 36.5 06/10/2021   MCV 92 06/10/2021    PLT 288 06/10/2021      Component Value Date/Time   NA 138 01/18/2021 1038   K 3.4 (L) 01/18/2021 1038   CL 107 01/18/2021 1038   CO2 23 01/18/2021 1038   GLUCOSE 83 01/18/2021 1038   BUN 17 01/18/2021 1038   CREATININE 0.68 01/18/2021 1038   CALCIUM 8.8 (L) 01/18/2021 1038   GFRNONAA >60 01/18/2021 1038     ASSESSMENT AND PLAN 48 y.o. year old female  has a past medical history of Allergies, Arthritis, Asthma, Dysrhythmia, GERD (gastroesophageal reflux disease), History of kidney stones, migraine headaches, Hypothyroid, Overactive bladder, Palpitations, and Pre-diabetes. here with:  Migraine headaches with aura   - Continue Qulipta  60 mg daily - Restart Nurtec 1 tablet at the onset of a migraine.  Only 1 tablet in 24 hours.  Information about Nurtec was provided on her after visit summary. - Follow-up in 1 year or sooner if needed   Discussed:  There is increased risk for stroke in women with migraine with aura and a contraindication for the combined contraceptive pill for use by women who have migraine with aura. The risk for women with migraine without aura is lower. However other risk factors like smoking are far more likely to increase stroke risk than migraine. There is a recommendation for no smoking and for the use of OCPs without estrogen such as progestogen only pills particularly for women with migraine with aura.SABRA People who have migraine headaches with auras may be 3 times more likely to have a stroke caused by a blood clot, compared to migraine patients who don't see auras. Women who take hormone-replacement therapy may be 30 percent more likely to suffer a clot-based stroke than women not taking medication containing estrogen.     Duwaine Russell, MSN, NP-C 02/24/2024, 7:45 AM [address] [facility] [address], [address] [place], [place] [address] [phone]

## 2024-02-24 NOTE — Telephone Encounter (Signed)
 Pharmacy Patient Advocate Encounter   Received notification from Patient Pharmacy that prior authorization for Nurtec 75MG  dispersible tablets is required/requested.   Insurance verification completed.   The patient is insured through [facility].   Per test claim: PA required; PA started via CoverMyMeds. KEY [id number] . Waiting for clinical questions to populate.

## 2024-02-24 NOTE — Patient Instructions (Signed)
 Your Plan:  Continue Qulipta  Restart Nurtec for abortive therapy. Take 1 tablet at the onset of migraine. Only 1 tablet in 24 hours If your symptoms worsen or you develop new symptoms please let us  know.    Thank you for coming to see us  at [facility]. I hope we have been able to provide you high quality care today.  You may receive a patient satisfaction survey over the next few weeks. We would appreciate your feedback and comments so that we may continue to improve ourselves and the health of our patients.

## 2024-03-18 ENCOUNTER — Ambulatory Visit (INDEPENDENT_AMBULATORY_CARE_PROVIDER_SITE_OTHER)

## 2024-03-18 DIAGNOSIS — J309 Allergic rhinitis, unspecified: Secondary | ICD-10-CM | POA: Diagnosis not present

## 2024-04-01 ENCOUNTER — Other Ambulatory Visit: Payer: Self-pay | Admitting: *Deleted

## 2024-04-03 MED ORDER — GEMTESA 75 MG PO TABS: 1.0000 | ORAL_TABLET | Freq: Every day | ORAL | 4 refills | Status: AC

## 2024-04-20 ENCOUNTER — Ambulatory Visit

## 2024-04-20 DIAGNOSIS — J302 Other seasonal allergic rhinitis: Secondary | ICD-10-CM

## 2024-05-11 ENCOUNTER — Ambulatory Visit: Admitting: Obstetrics & Gynecology

## 2024-05-23 ENCOUNTER — Ambulatory Visit: Admitting: Internal Medicine

## 2024-09-14 ENCOUNTER — Ambulatory Visit: Admitting: Allergy & Immunology

## 2025-03-01 ENCOUNTER — Ambulatory Visit: Admitting: Adult Health
